# Patient Record
Sex: Male | Born: 1940 | Race: White | Hispanic: No | Marital: Married | Smoking: Former smoker
Health system: Southern US, Community
[De-identification: ages and names within clinical notes are randomized; demographics above are authoritative.]

## PROBLEM LIST (undated history)

## (undated) DIAGNOSIS — K219 Gastro-esophageal reflux disease without esophagitis: Secondary | ICD-10-CM

## (undated) DIAGNOSIS — M1990 Unspecified osteoarthritis, unspecified site: Secondary | ICD-10-CM

## (undated) DIAGNOSIS — E7800 Pure hypercholesterolemia, unspecified: Secondary | ICD-10-CM

## (undated) DIAGNOSIS — J309 Allergic rhinitis, unspecified: Secondary | ICD-10-CM

## (undated) DIAGNOSIS — Z923 Personal history of irradiation: Secondary | ICD-10-CM

## (undated) DIAGNOSIS — D369 Benign neoplasm, unspecified site: Secondary | ICD-10-CM

## (undated) DIAGNOSIS — C801 Malignant (primary) neoplasm, unspecified: Secondary | ICD-10-CM

## (undated) DIAGNOSIS — J449 Chronic obstructive pulmonary disease, unspecified: Secondary | ICD-10-CM

## (undated) DIAGNOSIS — I2510 Atherosclerotic heart disease of native coronary artery without angina pectoris: Secondary | ICD-10-CM

## (undated) DIAGNOSIS — I10 Essential (primary) hypertension: Secondary | ICD-10-CM

## (undated) HISTORY — DX: Gastro-esophageal reflux disease without esophagitis: K21.9

## (undated) HISTORY — DX: Essential (primary) hypertension: I10

## (undated) HISTORY — PX: OTHER SURGICAL HISTORY: SHX169

## (undated) HISTORY — DX: Allergic rhinitis, unspecified: J30.9

## (undated) HISTORY — DX: Pure hypercholesterolemia, unspecified: E78.00

## (undated) HISTORY — PX: CORONARY ANGIOPLASTY: SHX604

## (undated) HISTORY — PX: CORONARY ARTERY BYPASS GRAFT: SHX141

## (undated) HISTORY — DX: Atherosclerotic heart disease of native coronary artery without angina pectoris: I25.10

## (undated) HISTORY — DX: Personal history of irradiation: Z92.3

## (undated) HISTORY — DX: Benign neoplasm, unspecified site: D36.9

---

## 1999-12-05 HISTORY — PX: CORONARY STENT PLACEMENT: SHX1402

## 2000-02-15 ENCOUNTER — Encounter: Payer: Self-pay | Admitting: *Deleted

## 2000-02-15 ENCOUNTER — Ambulatory Visit (HOSPITAL_COMMUNITY): Admission: RE | Admit: 2000-02-15 | Discharge: 2000-02-16 | Payer: Self-pay | Admitting: *Deleted

## 2001-02-26 ENCOUNTER — Inpatient Hospital Stay (HOSPITAL_COMMUNITY): Admission: RE | Admit: 2001-02-26 | Discharge: 2001-03-02 | Payer: Self-pay | Admitting: *Deleted

## 2001-02-26 ENCOUNTER — Encounter: Payer: Self-pay | Admitting: Cardiothoracic Surgery

## 2001-02-27 ENCOUNTER — Encounter: Payer: Self-pay | Admitting: Cardiothoracic Surgery

## 2001-02-28 ENCOUNTER — Encounter: Payer: Self-pay | Admitting: Cardiothoracic Surgery

## 2001-03-01 ENCOUNTER — Encounter: Payer: Self-pay | Admitting: Cardiothoracic Surgery

## 2001-12-04 DIAGNOSIS — D369 Benign neoplasm, unspecified site: Secondary | ICD-10-CM

## 2001-12-04 HISTORY — DX: Benign neoplasm, unspecified site: D36.9

## 2002-10-22 ENCOUNTER — Ambulatory Visit (HOSPITAL_COMMUNITY): Admission: RE | Admit: 2002-10-22 | Discharge: 2002-10-22 | Payer: Self-pay | Admitting: Internal Medicine

## 2003-11-24 ENCOUNTER — Ambulatory Visit (HOSPITAL_COMMUNITY): Admission: RE | Admit: 2003-11-24 | Discharge: 2003-11-24 | Payer: Self-pay | Admitting: Family Medicine

## 2003-11-26 ENCOUNTER — Ambulatory Visit (HOSPITAL_COMMUNITY): Admission: RE | Admit: 2003-11-26 | Discharge: 2003-11-26 | Payer: Self-pay | Admitting: Family Medicine

## 2006-03-22 ENCOUNTER — Ambulatory Visit: Payer: Self-pay | Admitting: Internal Medicine

## 2006-03-22 ENCOUNTER — Ambulatory Visit (HOSPITAL_COMMUNITY): Admission: RE | Admit: 2006-03-22 | Discharge: 2006-03-22 | Payer: Self-pay | Admitting: Internal Medicine

## 2006-03-26 ENCOUNTER — Ambulatory Visit: Payer: Self-pay | Admitting: Internal Medicine

## 2006-04-10 ENCOUNTER — Ambulatory Visit: Payer: Self-pay | Admitting: *Deleted

## 2006-04-11 ENCOUNTER — Ambulatory Visit: Payer: Self-pay | Admitting: Internal Medicine

## 2006-05-16 ENCOUNTER — Ambulatory Visit: Payer: Self-pay | Admitting: Internal Medicine

## 2006-07-30 ENCOUNTER — Ambulatory Visit: Payer: Self-pay | Admitting: Internal Medicine

## 2006-08-17 ENCOUNTER — Ambulatory Visit: Payer: Self-pay | Admitting: Internal Medicine

## 2006-08-20 ENCOUNTER — Ambulatory Visit (HOSPITAL_COMMUNITY): Admission: RE | Admit: 2006-08-20 | Discharge: 2006-08-20 | Payer: Self-pay | Admitting: Internal Medicine

## 2006-08-20 ENCOUNTER — Ambulatory Visit: Payer: Self-pay | Admitting: Internal Medicine

## 2007-09-18 ENCOUNTER — Encounter: Payer: Self-pay | Admitting: Cardiovascular Disease

## 2007-10-04 ENCOUNTER — Encounter (HOSPITAL_COMMUNITY): Admission: RE | Admit: 2007-10-04 | Discharge: 2007-11-03 | Payer: Self-pay | Admitting: Orthopedic Surgery

## 2007-12-13 ENCOUNTER — Encounter: Payer: Self-pay | Admitting: Cardiovascular Disease

## 2008-07-29 ENCOUNTER — Encounter: Payer: Self-pay | Admitting: Cardiovascular Disease

## 2009-03-02 ENCOUNTER — Ambulatory Visit (HOSPITAL_COMMUNITY): Admission: RE | Admit: 2009-03-02 | Discharge: 2009-03-02 | Payer: Self-pay | Admitting: Family Medicine

## 2009-05-13 ENCOUNTER — Encounter: Payer: Self-pay | Admitting: Cardiovascular Disease

## 2009-06-28 ENCOUNTER — Encounter: Payer: Self-pay | Admitting: Cardiovascular Disease

## 2009-07-02 ENCOUNTER — Encounter: Payer: Self-pay | Admitting: Cardiovascular Disease

## 2009-07-12 ENCOUNTER — Encounter: Payer: Self-pay | Admitting: Cardiovascular Disease

## 2009-07-26 ENCOUNTER — Encounter: Admission: RE | Admit: 2009-07-26 | Discharge: 2009-07-26 | Payer: Self-pay | Admitting: Rheumatology

## 2009-10-08 ENCOUNTER — Encounter: Payer: Self-pay | Admitting: Cardiovascular Disease

## 2010-05-10 ENCOUNTER — Encounter: Payer: Self-pay | Admitting: Cardiovascular Disease

## 2010-07-02 ENCOUNTER — Encounter: Payer: Self-pay | Admitting: Cardiovascular Disease

## 2010-12-30 ENCOUNTER — Ambulatory Visit
Admission: RE | Admit: 2010-12-30 | Discharge: 2010-12-30 | Payer: Self-pay | Source: Home / Self Care | Attending: Cardiovascular Disease | Admitting: Cardiovascular Disease

## 2010-12-30 ENCOUNTER — Encounter: Payer: Self-pay | Admitting: Cardiovascular Disease

## 2010-12-30 DIAGNOSIS — I10 Essential (primary) hypertension: Secondary | ICD-10-CM | POA: Insufficient documentation

## 2010-12-30 DIAGNOSIS — I25810 Atherosclerosis of coronary artery bypass graft(s) without angina pectoris: Secondary | ICD-10-CM | POA: Insufficient documentation

## 2010-12-30 DIAGNOSIS — E785 Hyperlipidemia, unspecified: Secondary | ICD-10-CM | POA: Insufficient documentation

## 2011-01-05 ENCOUNTER — Encounter: Payer: Self-pay | Admitting: Physician Assistant

## 2011-01-05 NOTE — Assessment & Plan Note (Signed)
Summary: np6/dx:CAD;HTN   Visit Type:  Initial Consult Primary Provider:  Yetta Numbers  CC:  CAD-HTN.  History of Present Illness: 70 year-old male presents for initial evaluation of CAD. He has been followed by the [facility] and wishes to establish here.   The patient developed chest and elbow pain in 2000 in a crescendo pattern. He underwent PCI of the LAD with a bare-metal stent. 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  He denies chest pain or tightness. He does complain of exertional dyspnea and attributes this to COPD. He has mild leg swelling. No orthopnea or PND. No other complaints.  Current Medications (verified): 1)  Exforge 5-160 Mg Tabs (Amlodipine Besylate-Valsartan) .... Take 1 Tablet By Mouth Once A Day 2)  Januvia 100 Mg Tabs (Sitagliptin Phosphate) .... Take 1 Tablet By Mouth Once A Day 3)  Omeprazole 20 Mg Tbec (Omeprazole) .... Take 1 Tablet By Mouth Once A Day 4)  Metformin Hcl 500 Mg Tabs (Metformin Hcl) .... Take 1 Tablet By Mouth Two Times A Day 5)  Aspirin 81 Mg Tbec (Aspirin) .... Take One Tablet By Mouth Daily 6)  Hydroxychloroquine Sulfate 200 Mg Tabs (Hydroxychloroquine Sulfate) .... Take 1 Tablet By Mouth Two Times A Day 7)  Metoprolol Succinate 25 Mg Xr24h-Tab (Metoprolol Succinate) .... Take One Tablet By Mouth Daily 8)  Pravastatin Sodium 20 Mg Tabs (Pravastatin Sodium) .... Take One Tablet By Mouth Daily At Bedtime  Allergies (verified): No Known Drug Allergies  Past History:  Past medical, surgical, family and social histories (including risk factors) reviewed, and no changes noted (except as noted below).  Past Medical History: Coronary Artery Disease s/p PCI 2000 and single vessel CABG 2001 Hypertension Hypercholesterolemia GERD  Small sliding hiatal hernia History of tubular adenomas in 2003  Past Surgical  History: Reviewed history from 12/29/2010 and no changes required.  stent placement in   March 2001.  He has occlusion of the stent and underwent one-vessel coronary artery bypass in March 2002.  left knee arthroscopy for meniscal tear.  left rotator cuff repair.  Family History: Reviewed history from 12/29/2010 and no changes required. Mother died from Heart failure Brother and father died from lung cancer  Social History: Reviewed history from 12/29/2010 and no changes required.   He is married with 2 grandchildren.  Quit smoking years  ago.  Ocassionally consumes alcohol.  Review of Systems       Negative except as per HPI   Vital Signs:  Patient profile:   70 year old male Height:      69 inches Weight:      217.75 pounds BMI:     32.27 Pulse rate:   71 / minute Pulse rhythm:   regular Resp:     18 per minute BP sitting:   135 / 78  (left arm) Cuff size:   large  Vitals Entered By: Vikki Ports (December 30, 2010 8:50 AM)  Physical Exam  General:  Pt is well-developed, overweight male, alert and oriented, no acute distress HEENT: normal Neck: no thyromegaly           JVP normal, carotid upstrokes normal without bruits Lungs: CTA Chest: equal expansion  CV: Apical impulse nondisplaced, RRR without murmur or gallop Abd: soft, NT, positive BS, no HSM, no bruit Back: no CVA tenderness Ext: no clubbing, cyanosis, or edema  femoral pulses 2+ without bruits        pedal pulses 2+ and equal Skin: warm, dry, no rash Neuro: CNII-XII intact,strength 5/5 = b/l    EKG  Procedure date:  12/30/2010  Findings:      NSR, 71 bpm, within normal limits.  Impression & Recommendations:  Problem # 1:  CORONARY ATHEROSLERO AUTOL VEIN BYPASS GRAFT (ICD-414.02) Pt is s/p CABG, 10 years removed, with LIMA to LAD. He is symptom-free. I have recommended an exercise treadmill stress test and continued medical management. He is on appropriate medical therapy with ASA,  beta-blocker, and statin. He has had a great deal of difficulty with statin intolerance but is tolerating low-dose pravastatin and lipids are followed by his PCP. We discussed an exercise and weight loss program.  His updated medication list for this problem includes:    Exforge 5-160 Mg Tabs (Amlodipine besylate-valsartan) .Marland Kitchen... Take 1 tablet by mouth once a day    Aspirin 81 Mg Tbec (Aspirin) .Marland Kitchen... Take one tablet by mouth daily    Metoprolol Succinate 25 Mg Xr24h-tab (Metoprolol succinate) .Marland Kitchen... Take one tablet by mouth daily  Orders: EKG w/ Interpretation (93000) Treadmill (Treadmill)  Problem # 2:  HYPERTENSION, BENIGN (ICD-401.1)  BP controlled.  His updated medication list for this problem includes:    Exforge 5-160 Mg Tabs (Amlodipine besylate-valsartan) .Marland Kitchen... Take 1 tablet by mouth once a day    Aspirin 81 Mg Tbec (Aspirin) .Marland Kitchen... Take one tablet by mouth daily    Metoprolol Succinate 25 Mg Xr24h-tab (Metoprolol succinate) .Marland Kitchen... Take one tablet by mouth daily  BP today: 135/78  His updated medication list for this problem includes:    Exforge 5-160 Mg Tabs (Amlodipine besylate-valsartan) .Marland Kitchen... Take 1 tablet by mouth once a day    Aspirin 81 Mg Tbec (Aspirin) .Marland Kitchen... Take one tablet by mouth daily    Metoprolol Succinate 25 Mg Xr24h-tab (Metoprolol succinate) .Marland Kitchen... Take one tablet by mouth daily  Patient Instructions: 1)  Your physician recommends that you continue on your current medications as directed. Please refer to the Current Medication list given to you today. 2)  Your physician wants you to follow-up in:  1 YEAR.  You will receive a reminder letter in the mail two months in advance. If you don't receive a letter, please call our office to schedule the follow-up appointment. 3)  Your physician has requested that you have an exercise tolerance test.  For further information please visit [email or web address]  Please also follow instruction sheet, as given.  Appended  Document: np6/dx:CAD;HTN Record review: Echo 06/14/2009 - LVEF 50-55% with mildly dilated LV. Myoview 07/12/2009 - Normal perfusion, no inducible ischemia, post-stress LVEF 59%.

## 2011-01-10 ENCOUNTER — Encounter (INDEPENDENT_AMBULATORY_CARE_PROVIDER_SITE_OTHER): Payer: MEDICARE | Admitting: Physician Assistant

## 2011-01-10 ENCOUNTER — Encounter: Payer: Self-pay | Admitting: Physician Assistant

## 2011-01-10 ENCOUNTER — Encounter (INDEPENDENT_AMBULATORY_CARE_PROVIDER_SITE_OTHER): Payer: MEDICARE

## 2011-01-10 ENCOUNTER — Encounter: Payer: Self-pay | Admitting: Cardiovascular Disease

## 2011-01-10 DIAGNOSIS — R0989 Other specified symptoms and signs involving the circulatory and respiratory systems: Secondary | ICD-10-CM

## 2011-01-10 DIAGNOSIS — I2510 Atherosclerotic heart disease of native coronary artery without angina pectoris: Secondary | ICD-10-CM

## 2011-01-19 ENCOUNTER — Ambulatory Visit (HOSPITAL_COMMUNITY)
Admission: RE | Admit: 2011-01-19 | Discharge: 2011-01-19 | Disposition: A | Discharge status: Home / Self Care | Payer: Medicare Other | Source: Ambulatory Visit | Attending: Cardiovascular Disease | Admitting: Cardiovascular Disease

## 2011-01-19 ENCOUNTER — Encounter: Payer: Self-pay | Admitting: Cardiovascular Disease

## 2011-01-19 ENCOUNTER — Other Ambulatory Visit: Payer: Self-pay | Admitting: Cardiovascular Disease

## 2011-01-19 DIAGNOSIS — J4489 Other specified chronic obstructive pulmonary disease: Secondary | ICD-10-CM | POA: Insufficient documentation

## 2011-01-19 DIAGNOSIS — Z951 Presence of aortocoronary bypass graft: Secondary | ICD-10-CM | POA: Insufficient documentation

## 2011-01-19 DIAGNOSIS — J449 Chronic obstructive pulmonary disease, unspecified: Secondary | ICD-10-CM | POA: Insufficient documentation

## 2011-01-19 DIAGNOSIS — Z01818 Encounter for other preprocedural examination: Secondary | ICD-10-CM | POA: Insufficient documentation

## 2011-01-19 LAB — CONVERTED CEMR LAB
INR: 1.07 (ref <1.50)
Prothrombin Time: 14.1 s (ref 11.6–15.2)

## 2011-01-19 NOTE — Letter (Signed)
Summary: Cardiac Catheterization Instructions- [facility]  [facility], Main Office  [address]   [place], [place] [address]   Phone: [phone]  Fax: [phone]     01/10/2011 MRN: [id number]  David [name] [address] [place], [place]  [address]  [place]  Dear David [name],   You are scheduled for a Cardiac Catheterization on Wednesday January 25, 2011 with Dr. Excell Seltzer  Please arrive to the 1st floor of the Heart and Vascular Center at [facility] at 7:30 am on the day of your procedure. Please do not arrive before 6:30 a.m. Call the Heart and Vascular Center at [phone] if you are unable to make your appointmnet. The Code to get into the parking garage under the building is 0300. Take the elevators to the 1st floor. You must have someone to drive you home. Someone must be with you for the first 24 hours after you arrive home. Please wear clothes that are easy to get on and off and wear slip-on shoes. Do not eat or drink after midnight except water with your medications that morning. Bring all your medications and current insurance cards with you.  _X__ DO NOT take these medications before your procedure: Do not take Januvia the morning of procedure. Do not take Metformin the night before, morning of, or 48 hours after procedure.   _X__ Make sure you take your aspirin.  _X__ You may take ALL of your medications with water that morning.  The usual length of stay after your procedure is 2 to 3 hours. This can vary.  If you have any questions, please call the office at the number listed above.   David Gutting, RN, BSN  Appended Document: Cardiac Catheterization Instructions- [facility] Order given to pt for pre-procedure labs and chest x-ray to be done in [place] on 01/19/11 or 01/20/11.

## 2011-01-20 LAB — CONVERTED CEMR LAB
BUN: 13 mg/dL (ref 6–23)
Basophils Absolute: 0 10*3/uL (ref 0.0–0.1)
Basophils Relative: 1 % (ref 0–1)
CO2: 28 meq/L (ref 19–32)
Calcium: 8.9 mg/dL (ref 8.4–10.5)
Chloride: 104 meq/L (ref 96–112)
Creatinine, Ser: 0.87 mg/dL (ref 0.40–1.50)
Eosinophils Absolute: 0.3 10*3/uL (ref 0.0–0.7)
Eosinophils Relative: 3 % (ref 0–5)
Glucose, Bld: 175 mg/dL — ABNORMAL HIGH (ref 70–99)
HCT: 45.6 % (ref 39.0–52.0)
Hemoglobin: 14.6 g/dL (ref 13.0–17.0)
Lymphocytes Relative: 16 % (ref 12–46)
Lymphs Abs: 1.3 10*3/uL (ref 0.7–4.0)
MCHC: 32 g/dL (ref 30.0–36.0)
MCV: 92.9 fL (ref 78.0–100.0)
Monocytes Absolute: 0.9 10*3/uL (ref 0.1–1.0)
Monocytes Relative: 11 % (ref 3–12)
Neutro Abs: 5.6 10*3/uL (ref 1.7–7.7)
Neutrophils Relative %: 70 % (ref 43–77)
Platelets: 290 10*3/uL (ref 150–400)
Potassium: 4.8 meq/L (ref 3.5–5.3)
RBC: 4.91 M/uL (ref 4.22–5.81)
RDW: 14.9 % (ref 11.5–15.5)
Sodium: 140 meq/L (ref 135–145)
WBC: 8.1 10*3/uL (ref 4.0–10.5)

## 2011-01-24 ENCOUNTER — Encounter: Payer: Self-pay | Admitting: Cardiovascular Disease

## 2011-01-25 ENCOUNTER — Inpatient Hospital Stay (HOSPITAL_BASED_OUTPATIENT_CLINIC_OR_DEPARTMENT_OTHER)
Admission: RE | Admit: 2011-01-25 | Discharge: 2011-01-25 | Disposition: A | Discharge status: Home / Self Care | Payer: Medicare Other | Source: Ambulatory Visit | Attending: Cardiovascular Disease | Admitting: Cardiovascular Disease

## 2011-01-25 DIAGNOSIS — R079 Chest pain, unspecified: Secondary | ICD-10-CM | POA: Insufficient documentation

## 2011-01-25 DIAGNOSIS — I2510 Atherosclerotic heart disease of native coronary artery without angina pectoris: Secondary | ICD-10-CM | POA: Insufficient documentation

## 2011-01-25 DIAGNOSIS — Z951 Presence of aortocoronary bypass graft: Secondary | ICD-10-CM | POA: Insufficient documentation

## 2011-01-25 DIAGNOSIS — T82897A Other specified complication of cardiac prosthetic devices, implants and grafts, initial encounter: Secondary | ICD-10-CM | POA: Insufficient documentation

## 2011-01-25 DIAGNOSIS — Y831 Surgical operation with implant of artificial internal device as the cause of abnormal reaction of the patient, or of later complication, without mention of misadventure at the time of the procedure: Secondary | ICD-10-CM | POA: Insufficient documentation

## 2011-01-25 DIAGNOSIS — R9439 Abnormal result of other cardiovascular function study: Secondary | ICD-10-CM | POA: Insufficient documentation

## 2011-01-26 LAB — POCT I-STAT GLUCOSE
Glucose, Bld: 160 mg/dL — ABNORMAL HIGH (ref 70–99)
Operator id: 141321

## 2011-01-31 ENCOUNTER — Encounter: Payer: Self-pay | Admitting: Cardiovascular Disease

## 2011-02-02 ENCOUNTER — Encounter: Payer: Medicare Other | Admitting: Physician Assistant

## 2011-02-08 ENCOUNTER — Ambulatory Visit (INDEPENDENT_AMBULATORY_CARE_PROVIDER_SITE_OTHER): Payer: Medicare Other | Admitting: Adult Health

## 2011-02-08 ENCOUNTER — Encounter: Payer: Self-pay | Admitting: Adult Health

## 2011-02-08 DIAGNOSIS — I2510 Atherosclerotic heart disease of native coronary artery without angina pectoris: Secondary | ICD-10-CM

## 2011-02-08 DIAGNOSIS — E7800 Pure hypercholesterolemia, unspecified: Secondary | ICD-10-CM

## 2011-02-08 DIAGNOSIS — I10 Essential (primary) hypertension: Secondary | ICD-10-CM

## 2011-02-09 ENCOUNTER — Telehealth: Payer: Self-pay | Admitting: Cardiovascular Disease

## 2011-02-09 ENCOUNTER — Encounter: Payer: Self-pay | Admitting: Adult Health

## 2011-02-09 NOTE — Progress Notes (Signed)
Summary: [facility]: Office Visit  [facility]: Office Visit   Imported By: Earl Many 01/31/2011 17:20:43  _____________________________________________________________________  External Attachment:    Type:   Image     Comment:   External Document

## 2011-02-09 NOTE — Progress Notes (Signed)
Summary: [facility]: Office Visit  [facility]: Office Visit   Imported By: Earl Many 01/31/2011 17:32:42  _____________________________________________________________________  External Attachment:    Type:   Image     Comment:   External Document

## 2011-02-09 NOTE — Cardiovascular Report (Signed)
Summary: Pre-Cath Orders  Pre-Cath Orders   Imported By: Marylou Mccoy 01/31/2011 10:42:33  _____________________________________________________________________  External Attachment:    Type:   Image     Comment:   External Document

## 2011-02-09 NOTE — Progress Notes (Signed)
Summary: [facility]: Office Visit  [facility]: Office Visit   Imported By: Earl Many 01/31/2011 18:38:49  _____________________________________________________________________  External Attachment:    Type:   Image     Comment:   External Document

## 2011-02-09 NOTE — Procedures (Signed)
NAME:  David [name], David [name]             ACCOUNT NO.:  [id number]  MEDICAL RECORD NO.:  [id number]           PATIENT TYPE:  O  LOCATION:  RAD                           FACILITY:  [facility]  PHYSICIAN:  Veverly Fells. Excell Seltzer, MD  DATE OF BIRTH:  1941/04/21  DATE OF PROCEDURE:  01/25/2011 DATE OF DISCHARGE:  01/19/2011                           CARDIAC CATHETERIZATION   PROCEDURE: 1. Left heart catheterization. 2. Selective coronary angiography. 3. Left ventricular angiography. 4. LIMA angiography.  PROCEDURAL INDICATIONS:  Mr. Sarver is a 70 year old gentleman who is 10 years out from single-vessel coronary bypass with a LIMA to LAD. This was done to treat severe proximal LAD in-stent restenosis.  He presented for follow-up evaluation recently and underwent an exercise treadmill study.  During the treadmill study, he had significant ST- segment depression associated with chest discomfort.  He was referred for cardiac catheterization in the setting of his abnormal stress test and known CAD.  Risks and indications of the procedure were reviewed with the patient. Informed consent was obtained.  The right groin was prepped, draped and anesthetized with 1% lidocaine using modified Seldinger technique.  A 4- French sheath was placed in the right femoral artery.  Standard Judkins catheters were used for coronary angiography and left ventriculography. A 3DRC catheter was used for LIMA angiography.  The patient tolerated the procedure well.  There were no immediate complications.  All catheter exchanges were performed over a wire.  PROCEDURAL FINDINGS:  Aortic pressure 133/66 with a mean of 95, left ventricular pressure 134/20.  Left ventriculography shows low normal LV function with an estimated ejection fraction of 50-55%.  There is no significant mitral regurgitation and there are no regional wall motion abnormalities noted.  Coronary angiography:  The left mainstem is patent.  There is  no significant obstructive disease.  The left main divides into the LAD and left circumflex.  LAD:  The LAD has severe stenosis proximally.  There is a stent with diffuse 90% stenosis.  There is a small first diagonal branch that divides into twin vessels and it is proximal to the LIMA insertion site. The remaining portions of the LAD fill exclusively from the LIMA graft.  Left circumflex:  The left circumflex is widely patent.  There are minor luminal irregularities with 25% mid stenosis present.  There is a large single obtuse marginal branch, they then divides into multiple sub- branches distally.  There is no obstructive disease of the marginal branch.  Right coronary artery:  The RCA is a moderate sized, dominant vessel. There is 30-40% mid stenosis, proximal vessel has nonobstructive plaque. The remaining portions of the RCA are patent with mild luminal irregularities.  The vessel gives off a PDA and two posterolateral branches distally.  LIMA to LAD is widely patent.  The distal anastomotic site is widely patent.  The proximal LAD fills retrograde from the graft as well.  The LAD reaches the left ventricular apex.  FINAL ASSESSMENT: 1. 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 2. Nonobstructive left circumflex  and right coronary artery stenosis. 3. Normal left ventricular function.  RECOMMENDATIONS:  The patient should continue on medical therapy.  He has a widely patent LIMA to LAD and nonobstructive disease elsewhere. He has a small diagonal branch that arises proximal to the LIMA graft and it is possible that his abnormal stress test arose from ischemia in that area.  Those vessels are very small and do not require PCI.  He should do well with ongoing medical treatment.     Veverly Fells. Excell Seltzer, MD     MDC/MEDQ  D:  01/25/2011  T:  01/25/2011  Job:   [id number]  cc:   [name], M.D.  Electronically Signed by Tonny Bollman MD on 02/07/2011 08:58:35 PM

## 2011-02-14 NOTE — Progress Notes (Signed)
Summary: Pt will be followed in [facility] by Dr Excell Seltzer  Phone Note Call from Patient Call back at Work Phone [phone] Call back at [phone]   Caller: Patient Reason for Call: Talk to Nurse Initial call taken by: Judie Grieve,  February 09, 2011 9:02 AM  Follow-up for Phone Call        I spoke with the pt and he had his post hospital follow-up appt in [place] yesterday.  The pt is not planning on being seen in the [place] office long term.  The pt will be coming back to the [place] office to see Dr Excell Seltzer.  I made the pt aware that since he did have a recent intervention then he should plan on seeing Dr Excell Seltzer in 6 months as recommended from 02/08/11 OV. Follow-up by: Julieta Gutting, RN, BSN,  February 09, 2011 9:45 AM

## 2011-02-14 NOTE — Assessment & Plan Note (Signed)
Summary: POST CATH GROIN CHECK PER MARK FROM [facility]   Visit Type:  Follow-up Primary Provider:  Yetta Numbers  CC:  post hospital .  History of Present Illness: David [name] is a pleasant70 y/o CM we are seeing on follow-up after initial visit with Dr. Tonny Bollman to be established in the practice.  He is a former [facility] patient.  He has known hsitory of CAD with LIMA to LAD (2001), and previous stent to the LAD (2000). Because of recurrent symptoms of DOE the patient was scheduled for cardiac stress test.  This was found to be abnormal and cardiac catherization was recommended.  Catherization was completed  01/19/2011 by Dr. Excell Seltzer.  He was found to have severe proximal LAD instent restenosis with a patent LIMA to LAD.  Nonobstructive left Cx and RCA. Normal Lv fx.  He is to be continued on medical regimin.  He is without complaints of chest discomfort or DOE today.  He states that he feels great and was very pleased with Dr. Excell Seltzer and the [facility] staff in [place].  He will now be followed in [place] as this is closer to his home.  Current Medications (verified): 1)  Exforge 5-160 Mg Tabs (Amlodipine Besylate-Valsartan) .... Take 1 Tablet By Mouth Once A Day 2)  Januvia 100 Mg Tabs (Sitagliptin Phosphate) .... Take 1 Tablet By Mouth Once A Day 3)  Omeprazole 20 Mg Tbec (Omeprazole) .... Take 1 Tablet By Mouth Once A Day 4)  Metformin Hcl 500 Mg Tabs (Metformin Hcl) .... Take 1 Tablet By Mouth Two Times A Day 5)  Aspirin 81 Mg Tbec (Aspirin) .... Take One Tablet By Mouth Daily 6)  Hydroxychloroquine Sulfate 200 Mg Tabs (Hydroxychloroquine Sulfate) .... Take 1 Tablet By Mouth Two Times A Day 7)  Metoprolol Succinate 25 Mg Xr24h-Tab (Metoprolol Succinate) .... Take One Tablet By Mouth Daily 8)  Pravastatin Sodium 20 Mg Tabs (Pravastatin Sodium) .... Take One Tablet By Mouth Daily At Bedtime  Allergies (verified): No Known Drug Allergies  Comments:  Nurse/Medical Assistant: patient  and i reviewed meds no list  no meds [facility]  Past History:  Past medical, surgical, family and social histories (including risk factors) reviewed, and no changes noted (except as noted below).  Past Medical History: Reviewed history from 12/30/2010 and no changes required. Coronary Artery Disease s/p PCI 2000 and single vessel CABG 2001 Hypertension Hypercholesterolemia GERD  Small sliding hiatal hernia History of tubular adenomas in 2003  Past Surgical History: Reviewed history from 12/29/2010 and no changes required.  stent placement in   March 2001.  He has occlusion of the stent and underwent one-vessel coronary artery bypass in March 2002.  left knee arthroscopy for meniscal tear.  left rotator cuff repair.  Family History: Reviewed history from 12/29/2010 and no changes required. Mother died from Heart failure Brother and father died from lung cancer  Social History: Reviewed history from 12/29/2010 and no changes required.   He is married with 2 grandchildren.  Quit smoking years  ago.  Ocassionally consumes alcohol.  Review of Systems       All other systems have been reviewed and are negative unless stated above.   Vital Signs:  Patient profile:   70 year old male Weight:      220 pounds BMI:     32.61 Pulse rate:   92 / minute BP sitting:   127 / 79  (left arm)  Vitals Entered By: Dreama Saa, CNA (February 08, 2011 2:33 PM)  Physical Exam  General:  Well developed, well nourished, in no acute distress.obese.   Lungs:  Clear bilaterally to auscultation and percussion. Heart:  Non-displaced PMI, chest non-tender; regular rate and rhythm, S1, S2 without murmurs, rubs or gallops. Carotid upstroke normal, no bruit. Normal abdominal aortic size, no bruits. Femorals normal pulses, no bruits. Pedals normal pulses. No edema, no varicosities. Abdomen:  Bowel sounds positive; abdomen soft and non-tender without masses, organomegaly, or hernias noted. No  hepatosplenomegaly. Msk:  Back normal, normal gait. Muscle strength and tone normal. Pulses:  pulses normal in all 4 extremities Extremities:  No clubbing or cyanosis. Neurologic:  Alert and oriented x 3. Psych:  Normal affect.   EKG  Procedure date:  02/08/2011  Findings:      Normal sinus rhythm with rate of:  81 bpm  Impression & Recommendations:  Problem # 1:  CORONARY ATHEROSLERO AUTOL VEIN BYPASS GRAFT (ICD-414.02) Recent cardiac catherization was reassuring.  He is asymptomatic. We will continue risk factor modification and current medications at this time. He will follow with [place] in 6 months unless needed to be seen sooner.  He verbalizes understanding. His updated medication list for this problem includes:    Exforge 5-160 Mg Tabs (Amlodipine besylate-valsartan) .Marland Kitchen... Take 1 tablet by mouth once a day    Aspirin 81 Mg Tbec (Aspirin) .Marland Kitchen... Take one tablet by mouth daily    Metoprolol Succinate 25 Mg Xr24h-tab (Metoprolol succinate) .Marland Kitchen... Take one tablet by mouth daily  Orders: EKG w/ Interpretation (93000)  Problem # 2:  HYPERTENSION, BENIGN (ICD-401.1) Well controlled. His updated medication list for this problem includes:    Exforge 5-160 Mg Tabs (Amlodipine besylate-valsartan) .Marland Kitchen... Take 1 tablet by mouth once a day    Aspirin 81 Mg Tbec (Aspirin) .Marland Kitchen... Take one tablet by mouth daily    Metoprolol Succinate 25 Mg Xr24h-tab (Metoprolol succinate) .Marland Kitchen... Take one tablet by mouth daily  Orders: EKG w/ Interpretation (93000)  Problem # 3:  HYPERLIPIDEMIA-MIXED (ICD-272.4) Follwed by Dr. Regino Schultze His updated medication list for this problem includes:    Pravastatin Sodium 20 Mg Tabs (Pravastatin sodium) .Marland Kitchen... Take one tablet by mouth daily at bedtime  Patient Instructions: 1)  Your physician wants you to follow-up in: 6 months. You will receive a reminder letter in the mail about two months in advance. If you don't receive a letter, please call our office to schedule the  follow-up appointment. 2)  Your physician recommends that you continue on your current medications as directed. Please refer to the Current Medication list given to you today. 3)  YOU MAY HAVE REFILLS ON ANY OF YOUR MEDICATIONS PER REQUEST/PER [facility].

## 2011-04-21 NOTE — Consult Note (Signed)
. [facility]  Patient:    David [name], David [name]                    MRN: [id number] Adm. Date:  [id number] Attending:  Lenise Herald H CC:         Lenise Herald, M.D.                          Consultation Report  REFERRING PHYSICIAN:  Lenise Herald, M.D.  FOLLOW-UP CARDIOLOGIST:  Lenise Herald, M.D.  PRIMARY CARE PHYSICIAN:  Dr. Jodelle Green.  REASON FOR CONSULTATION:  High-grade proximal LAD stenosis, status post angioplasty and positive stress test.  HISTORY OF PRESENT ILLNESS:  The patient is a 70 year old male with a history of smoking history in the past and mild diabetes, who presents following a routine follow-up Cardiolite stress test.  The patient had previously had chest burning in March 2001, in that time had a positive stress test and ultimately underwent angioplasty of a proximal LAD lesion with intracoronary stent.  Since that time, the patient has done well, continuing to avoid cigarettes and engaged in active weight loss and exercise program, having lost 30-35 pounds.  He notes that overall his physical endurance is markedly improved.  On the follow-up Cardiolite stress test, he was positive with anterior ischemia, which led to a cardiac catheterization by Dr. Jenne Campus today.  This showed 30% luminal irregularities of the right coronary artery and high-grade, greater than 95% in-stent stenosis of the proximal LAD. Overall, ventricular function is relatively well-preserved.  The patient has had no definite anginal symptoms, though for several months since the fall of 2001, he had been bothered by cold-like symptoms associated with a dry, nonproductive cough.  In February, he saw Dr. Sandrea Hughs for this, and he was taken off Monopril and started on Diovan and noted within three days marked improvement in his respiratory symptoms and cough.  He denies any previous history of myocardial infarction.  He has had no previous cardiac  surgery.  As noted, his Cardiolite stress showed significant anterior and anterior septal wall ischemia with normal ejection fraction done February 15, 2001.  Cardiac risk factors include hypertension, hyperlipidemia, positive family with congestive heart failure, myocardial infarction in his mother in her 87s.  Father died of pulmonary embolus in a setting of cancer.  The patient denies any previous stroke, denies peripheral vascular disease, denies renal insufficiency.  He does have type 2 diabetes but currently on no medication.  We do not have a recent hemoglobin A1C.  PAST MEDICAL HISTORY:  Significant for erectile dysfunction, currently on Viagra.  PAST SURGICAL HISTORY:  Arthroscopy, 1977.  SOCIAL HISTORY:  The patient is married, has two children.  The patient is currently employed as Systems developer.  He does occasionally drink alcohol but has avoided cigarettes for the past 3-1/2 years.  MEDICATIONS:  Medications at the time of admission include Lipitor 10 mg a day, Norvasc 5 mg a day, Diovan 80 mg a day, aspirin 325 mg a day, vitamin E 400 units, vitamin C 500 mg b.i.d., Nexium 40 mg q.d., Viagra, and Toprol.  ALLERGIES:  Drug allergies include MONOPRIL, which causes cough.  CARDIAC REVIEW OF SYSTEMS:  Patient denies chest pain, lower extremity edema, resting shortness of breath, palpitations, syncope, exertional shortness of breath, or presyncope.  He does have two-pillow orthopnea at times.  GENERAL REVIEW OF SYSTEMS:  Overall, patient has lost 30-35 pounds with regular exercise.  His respiratory status has been stable since his cough resolved after stopping the Monopril.  GASTROINTESTINAL:  The patient denies any blood or dark stools.  Denies any TIAs or amaurosis.  Does get some muscle cramps in his right leg.  Denies any blood in his urine.  Denies any recent infections.  Denies any obvious blood loss or any previous history of low hematocrit.  ENDOCRINE:  Denies any  thyroid problems.  Does have type 2 diet-controlled diabetes.  Denies any psychiatric history.  Denies claudication or other signs of peripheral vascular disease.  PHYSICAL EXAMINATION:  VITAL SIGNS:  The patients blood pressure is 122/80, pulse of 72, respiratory rate 18, weight is 207, 5 feet 8-1/2 inches tall.  GENERAL:  The patient has slightly ruddy complexion, in no distress at the time of exam.  Neurologically he was intact and able to relate his history.  HEENT:  Pupils are equal, round and reactive to light.  NECK:  Without carotid bruits or jugular venous distention.  CHEST:  Clear to auscultation and percussion bilaterally.  CARDIAC:  Regular rate and rhythm without murmur or gallop.  ABDOMEN:  Moderately obese but without palpable enlargement of the aorta or organomegaly.  EXTREMITIES:  Patient has 2+ DP and PT pulses bilaterally.  Appears to have adequate vein in lower extremities.  He has no ischemic skin changes in the lower extremities.  NEUROLOGIC:  Neurologic exam is grossly intact.  LYMPHATIC:  He has no palpable cervical or supraclavicular lymph nodes.  LABORATORY DATA:  Laboratory findings reveal a hemoglobin of 10.4 and hematocrit of 31.5.  This lab was drawn as an outpatient on February 20, 2001, through Dr. Loraine Grip office.  Platelet count was 292.  PT 12.4, INR 0.9, PTT 29.  In December 2001, his total cholesterol was 146, triglycerides 94, HDL 42, LDL 85.  Creatinine 0.9 on February 20, 2001.  Cardiac catheterization reveals preserved LV function.  In-stent stenosis of greater than 95% in the proximal LAD.  Circumflex is normal.  Right coronary artery has some distal luminal irregularities and 30-40% stenosis in the right coronary artery.  IMPRESSION: 1. Positive Cardiolite stress test with anterior ischemia in the face of    in-stent greater than 95% stenosis of the proximal left anterior    descending coronary artery. 2. Unexplained drop in  hematocrit from 46% in December to 31.5% on February 20, 2001, without obvious blood loss. 3. Type 2 diabetes mellitus with diet control.   SUGGESTION:  Will obtain follow-up hematocrit, reticulocyte count, guaiac all stools, and check hemoglobin A1C to recheck the patients laboratory abnormalities.  Otherwise, will plan on proceeding with coronary artery bypass grafting with a left internal mammary artery to the left anterior descending coronary artery, possibly off-pump.  This was discussed with the patient and his family in detail, including the risk of death, infection, stroke, myocardial infarction, bleeding, blood transfusion.  The patient is willing to proceed and will tentatively arrange for surgery on February 28, 2000.  The patient is agreeable with this approach. DD:  02/25/01 TD:  02/26/01 Job: [id number]

## 2011-04-21 NOTE — Discharge Summary (Signed)
[place]  Patient:    David [name], David [name]                    MRN: [id number] Adm. Date:  [id number] Disc. Date: [id number] Attending:  Waldo Laine Dictator:   Lissa Hoard, P.A.-C. CC:         Gwenith Daily. Tyrone Sage, M.D.  Lenise Herald, M.D.   Discharge Summary  DATE OF BIRTH:  Apr 24, 1941  ADMITTING DIAGNOSIS:  Coronary artery disease  SECONDARY DIAGNOSIS:   Non-insulin-dependent diabetes mellitus which is diet controlled.  DISCHARGE DIAGNOSIS:   Coronary artery disease.  HOSPITAL PROCEDURES: 1. Cardiac catheterization. 2. Coronary artery bypass graft x 1. 3. Bilateral carotid Duplex. 4. Upper extremity Doppler exam.  HOSPITAL COURSE:  Mr. David [name] was admitted to [facility] on February 25, 2001, secondary to positive stress test.  Because of his positive stress test, he underwent elective cardiac catheterization.  This cardiac catheterization revealed significant coronary artery disease.  Because of this, Dr. Tyrone Sage was consulted.  The patient underwent a coronary artery bypass graft x 1 off pump with the left internal mammary artery anastomosed to the left anterior descending artery.  The procedure was performed by Dr. Tyrone Sage.  No complications were noted during the procedure.  The patient had an uneventful hospital course and was discharged home in stable and stable condition on March 02, 2001.  DISCHARGE MEDICATIONS: 1. Tylox one or two tablets every four to six hours as needed for pain. 2. Aspirin 325 mg one tablet daily. 3. Lipitor 10 mg one tablet daily. 4. Norvasc 5 mg one tablet daily. 5. Diovan 80 mg one tablet daily. 6. Toprol XL 25 mg one tablet daily.  DISCHARGE ACTIVITY:  Patient is to avoid driving, strenuous activity and no lifting over 10 pounds.  DIET:  Low fat, low salt.  WOUND CARE:  The patient is told he can shower and clean his incision with soap and water.  DISPOSITION:   Home.  FOLLOW-UP:  The patient is told to call Dr. Loraine Grip office for appointment in two weeks.  He is told to bring his chest x-ray to Dr. Ysidro Evert office with him.  He was also instructed to follow up with Dr. Tyrone Sage at the [facility] office in three weeks.  Office will call him and verify the time and date of his appointment. DD:  03/01/01 TD:  03/03/01 Job: [id number]

## 2011-04-21 NOTE — Assessment & Plan Note (Signed)
[facility]                               PULMONARY OFFICE NOTE   David [name], David [name]                    MRN:          [id number]  DATE:07/30/2006                            DOB:          11/04/1941    HISTORY:  A 70 year old white male with COPD, felt to be mild to moderate on  his last visit on May 16, 2006, with symptoms disproportionate to the  severity of COPD that I thought probably represented upper airway  instability related to reflux and obesity.  He comes in today acutely worse  for five days with sore throat and fever without this time, significant  cough but having increased dyspnea and sweating with activity.  He has  already been seen and treated with Zithromax and prednisone and denies any  excess sputum production at present, classic reflux, fever, chills, sweats,  orthopnea, PND or leg swelling.   Medications were reviewed with the patient in detail in the column dated  July 30, 2006.  Note that the patient did not double the Prilosec dose as  I had recommended for any flare up of his symptoms.   PHYSICAL EXAMINATION:  GENERAL APPEARANCE:  He is an obese, pleasant,  ambulatory white male in no acute distress.  VITAL SIGNS:  Weight is 223 pounds, which is down 10 pounds previous visit.  Vital signs are unremarkable.  HEENT:  Remarkable for oropharynx clear.  NECK:  Supple without cervical adenopathy or tenderness.  Trachea is  midline.  No thyromegaly.  LUNGS:  Lung fields are clear except for prominent pseudowheeze.  CARDIOVASCULAR:  There is regular rate and rhythm without murmurs, rubs, or  gallops.  ABDOMEN:  Soft and benign.  EXTREMITIES:  Warm without calf tenderness, clubbing, cyanosis, or edema.   Chest x-ray is normal.   IMPRESSION:  This patient had much more prominent pseudowheeze than true  wheeze again today indicating to me the problem is more related to upper  airway and probably reflux than to chronic  obstructive pulmonary disease or  an asthmatic exacerbation.  Certainly this reflux did not cause the fever of  102, but this problem has been addressed with Zithromax as he has  defervesced and what probably happens with viral upper respiratory  infections is that the upper airway gets denuded to the point where any  reflux at all is too much reflux and results in sustained or exaggerated  symptoms of upper respiratory infection.   I tried to explain this all to the patient and emphasized the importance of  taking the Prilosec perfectly regular before meals twice daily and then I  did give him a course of Levaquin should he respike any fever or have any  purulent sputum after the Z pack is complete.   Pulmonary follow-up, however, can be p.r.n.                                   David [name]. David Sires, MD, FCCP   MBW/MedQ  DD:  07/30/2006  DT:  07/31/2006  Job #:  [id number]   cc:   David Ruths, MD

## 2011-04-21 NOTE — Op Note (Signed)
NAME:  David [name], David [name]             ACCOUNT NO.:  [id number]   MEDICAL RECORD NO.:  [id number]          PATIENT TYPE:  AMB   LOCATION:  DAY                           FACILITY:  [facility]   PHYSICIAN:  Lionel December, M.D.    DATE OF BIRTH:  15-Feb-1941   DATE OF PROCEDURE:  08/20/2006  DATE OF DISCHARGE:                                 OPERATIVE REPORT   PROCEDURE:  Esophagogastroduodenoscopy.   INDICATIONS:  [name] is a 70 year old Caucasian male with over five years of  ERD who also has epigastric pain.  He is undergoing EGD to rule out  complicated GERD as well as peptic ulcer disease.  The procedure and risks  were reviewed with the patient and informed consent was obtained.   MEDS FOR CONSCIOUS SEDATION:  Benzocaine spray for pharyngeal topical  anesthesia, Demerol 50 mg IV, Versed 4 mg IV.   FINDINGS:  The procedure was performed in the endoscopy suite.  The  patient's vital signs and O2 sats were monitored during the procedure and  remained stable.  The patient was placed in the left lateral position.  The  Olympus videoscope was passed via oropharynx without any difficulty into  esophagus.   Esophagus:  The mucosa of the esophagus was normal.  GE junction was wavy,  located at 40 cm from the incisors and hiatus was at 42.  He had a small  sliding hiatal hernia.   Stomach:  It was empty and distended very well with insufflation.  Folds of  the proximal stomach were normal.  Examination of the mucosa at body,  antrum, pyloric channel as well as angularis, fundus and cardia was normal.   Duodenum:  The bulbar mucosa was normal.  The scope was passed to the second  part of the duodenum where mucosa and folds were normal.  The endoscope was  withdrawn.  The patient tolerated the procedure well.   FINAL DIAGNOSIS:  Small sliding hiatal hernia, otherwise, normal  esophagogastroduodenoscopy.   RECOMMENDATIONS:  He will continue anti-reflux measures and Prilosec, as  before.  He will  call if upper abdominal pain recurs.      Lionel December, M.D.  Electronically Signed     NR/MEDQ  D:  08/20/2006  T:  08/21/2006  Job:  [id number]   cc:   Patrica Duel, M.D.  Fax: [phone]

## 2011-04-21 NOTE — Op Note (Signed)
NAME:  David [name], David [name]             ACCOUNT NO.:  [id number]   MEDICAL RECORD NO.:  [id number]          PATIENT TYPE:  AMB   LOCATION:  DAY                           FACILITY:  [facility]   PHYSICIAN:  Lionel December, M.D.    DATE OF BIRTH:  [birth year]   DATE OF PROCEDURE:  03/22/2006  DATE OF DISCHARGE:                                 OPERATIVE REPORT   PROCEDURE:  Colonoscopy.   ENDOSCOPIST:  Lionel December, M.D.   INDICATIONS:  Darius is a 70 year old Caucasian male who had a screening  colonoscopy in November 2003 with removal of 2 tubular adenomas; the one  from the rectum was a large polyp over 15 mm in diameter.  He is returning  for surveillance colonoscopy.  He does not have any symptoms.  Procedure and  risks were reviewed the patient and informed consent was obtained.   MEDICATIONS FOR CONSCIOUS SEDATION:  Demerol 25 mg IV, Versed 4 mg IV.   FINDINGS:  Procedure performed in endoscopy suite.  The patient's vital  signs and O2 SAT were monitored during the procedure and remained stable.  The patient was placed in the left lateral decubitus position and rectal  examination performed.  No abnormality was noted on external or digital  exam.  Olympus videoscope was placed in the rectum and advanced under direct  vision into sigmoid colon and beyond.  The preparation was excellent.  A few  scattered diverticula were noted at the sigmoid and transverse colon.  Scope  was passed to the cecum, which was identified by ileocecal valve and  appendiceal orifice.  Pictures were taken for the record.  As the scope was  withdrawn, colonic mucosa was carefully examined; there no polyps or other  mucosal abnormalities.  There was a scar at rectum without any recurrent or  residual polyp.  Pictures were taken for the record.  Scope was retroflexed  to examine anorectal junction, which was unremarkable.  Endoscope was  straightened and withdrawn.  The patient tolerated the procedure well.   FINAL  DIAGNOSES:  1.  A few scattered diverticula in sigmoid and transverse colon.  2.  No evidence of recurrent polyps.   RECOMMENDATIONS:  1.  High-fiber diet.  2.  Yearly Hemoccults.  3.  He should return for next exam in 5 years from now.      Lionel December, M.D.  Electronically Signed     NR/MEDQ  D:  03/22/2006  T:  03/23/2006  Job:  [id number]   cc:   [name], M.D.  Fax: [phone]

## 2011-04-21 NOTE — H&P (Signed)
NAME:  David [name], David [name]             ACCOUNT NO.:  [id number]   MEDICAL RECORD NO.:  [id number]          PATIENT TYPE:  AMB   LOCATION:  DAY                           FACILITY:  [facility]   PHYSICIAN:  Lionel December, M.D.    DATE OF BIRTH:  19-Oct-1941   DATE OF ADMISSION:  DATE OF DISCHARGE:  LH                                HISTORY & PHYSICAL   ADDENDUM:  This is an addendum to job [phone].   Patient plans on obtaining blood work through Dr. Mikey Bussing office next  week.  I have asked that he have any results faxed to our office.  If he  does not have a CBC done, we will obtain one at the time of his procedure.      Tana Coast, P.A.      Lionel December, M.D.  Electronically Signed    LL/MEDQ  D:  08/17/2006  T:  08/17/2006  Job:  [id number]

## 2011-04-21 NOTE — Cardiovascular Report (Signed)
[place]  Patient:    David [name], David [name]                    MRN: [id number] Proc. Date: 02/15/00 Adm. Date:  [id number] Attending:  Darlin Priestly CC:         Bryan Lemma. Jodelle Green, M.D., [place], [place].                        Cardiac Catheterization  PROCEDURE: 1. Left heart catheterization. 2. Coronary angiography. 3. Left ventriculogram. 4. Left anterior descending artery-proximal.    -Placement of intracoronary stent.  ATTENDING FOR THE CASE:  Lenise Herald, M.D.  COMPLICATIONS:  None.  INDICATIONS:  Mr. Stahnke is a 70 year old white male patient of Dr. Luciana Axe in [place], [place]., with a history of hypertension and tobacco abuse who was sent to our office on February 14, 2000, for an exercise treadmill test secondary to recent complaint of substernal chest pain migrating to both arms, occurring t rest.  He did undergo exercise treadmill test on February 14, 2000, which was early positive and is now referred for cardiac catheterization.  DESCRIPTION OF PROCEDURE:  After giving informed written consent, the patient was brought to the cardiac catheterization lab where his right and left groins were  shaved, prepped, and draped in the usual sterile fashion.  ECG monitoring was established.  Using a modified Seldinger technique, a 6-French arterial sheath as inserted in the right femoral artery.  The 6-French diagnostic catheters were then used to perform diagnostic angiography.  ANGIOGRAPHY: 1. This revealed a medium sized left main with no significant disease. 2. The LAD is a medium sized vessel which coursed to the apex and gave rise to    three diagonal branches.  The LAD is noted to have an 80% proximal stenotic    lesion just prior to the takeoff of the first diagonal.  The remainder of the    LAD has no significant disease.  The first diagonal is a medium sized vessel    which bifurcates in its mid portion and has no  significant disease.  The second    and third diagonals are small vessels with no significant disease. 3. The left circumflex is a medium sized vessel which coursed in the AV groove nd    gave rise to one large bifurcating obtuse marginal.  The AV groove circumflex    has no significant disease.  The first OM is a large vessel which bifurcates in    its mid portion and has no significant disease. 4. The right coronary artery is a medium sized vessel which is dominant and gives    rise to a PDA as well as a posterolateral branch.  The RCA is noted to have    40% proximal, 40% mid, and 40% distal stenotic lesions.  The PDA and    posterolateral branch are medium sized vessels with no significant disease.  The left ventriculogram reveals preserved EF measured at 50%.  There is no significant MR.  HEMODYNAMICS:  Systemic arterial pressure 118/65, LV systemic pressure 116/8, LVEDP of 12.  INTERVENTIONAL PROCEDURE:  LAD-proximal.  Following diagnostic angiography, a 7-French JL3.5 guiding catheter with sideholes was coaxially engaged in the left coronary ostium.  A selective coronary angiogram was then performed.  Next, a 0.014 Patriot exchange length guidewire was advanced out of the guiding catheter into the proximal LAD.  The guidewire was then used to cross the  proximal LAD stenotic lesion and was positioned in the distal LAD without difficulty.  After placement of the wire, the patient developed no flow in the LAD and had ST segment depression. Intracoronary nitroglycerin was then given with resolution of ST segments, and low was then reestablished down the LAD.  Next, a Tetra 3.0 x 8-mm coronary stent was then positioned across the proximal LAD stenotic lesion with careful attention ot to extend the stent into the left main.  The stent was then deployed to a maximum of 15 atmospheres for a total of 54 seconds.  Followup angiograms revealed no evidence of dissection or  thrombus with TIMI-3 flow to the distal vessel. Because of the complexity of the case, ReoPro was given.  Intravenous doses of heparin ere given to maintain the ACT between 200-300.  Final orthogonal angiograms revealed less than 10% residual stenosis in the proximal LAD with TIMI-3 flow to the distal vessel and no evidence of dissection or thrombus.  At this point, we elected to conclude the procedure.  All balloons, wires, and catheters were removed.  A hemostatic sheath was sewn in place, and he patient was transferred back to the ward in stable condition and to continue with ReoPro infusion to complete 12 hours.  CONCLUSIONS: 1. Successful placement of a Guidant 3.0 x 8-mm Tetra coronary stent in the    proximal LAD stenotic lesion. 2. Low normal LV systolic function. 3. Adjunct use of ReoPro (abciximab) infusion. DD:  02/15/00 TD:  02/15/00 Job: [id number]

## 2011-04-21 NOTE — Cardiovascular Report (Signed)
[name]. [facility]  Patient:    David [name], David [name]                    MRN: [id number] Proc. Date: 02/25/01 Adm. Date:  [id number] Attending:  Darlin Priestly CC:         Luciana Axe, M.D., [place], [place]   Cardiac Catheterization  PROCEDURES: 1. Left heart catheterization. 2. Coronary angiography. 3. Left ventriculogram.  COMPLICATIONS:  None.  INDICATIONS:  David [name] is a 70 year old, white male, patient of Dr. Luciana Axe with a history of CAD, status post stenting of his LAD on February 15, 2000, using a Tetra 3.0 x 8 mm stent.  He also has a history of tobacco abuse and mild hypertension.  The patient recently has complained of atypical chest pain and underwent stress Cardiolite revealing evidence of anterior wall ischemia.  He is now referred for repeat cardiac catheterization to redefine his coronary anatomy.  DESCRIPTION OF PROCEDURE:  After given informed written consent, the patient was brought to the cardiac catheterization lab where his right and left groins were shaved, prepped, and draped in the usual sterile fashion.  ECG monitoring was established.  Using modified Seldinger technique, a #6 French arterial sheath was inserted in the right femoral artery.  A 6 French diagnostic catheter was then used to perform diagnostic angiography.  This reveals a large left main with no significant disease.  The LAD is a medium sized vessel which coursed to the apex and gave rise to two diagonal branches.  The LAD is noted to have a stent in its proximal portion with diffuse in-stent re-stenosis involving the proximal stent extending back to the ostium of the LAD.  This extends just distal to placement of the stent.  The first and second diagonals are small vessels with no significant disease.  The left circumflex is a medium sized vessel which coursed in the AV groove and gave rise to two obtuse marginal branches.  The AV groove circumflex  has no significant disease.  The first and second OMs are medium sized vessels with no significant disease.  The right coronary artery is a large vessel which is dominant and gives rise to both the PDA as well as the posterolateral branch.  The RCA is noted to have 40% proximal, 30% mid and course irregularities throughout the distal portion.  The PDA and posterolateral branch have no significant disease.  LEFT VENTRICULOGRAM:  The left ventriculogram reveals preserved EF at 60%.  HEMODYNAMICS:  Systemic arterial pressure 133/99, LV systemic pressure 133/16, LVEDP of 19.  CONCLUSIONS: 1. Significant one-vessel coronary artery disease. 2. Normal left ventricular systolic function. DD:  02/25/01 TD:  02/25/01 Job: [id number]

## 2011-04-21 NOTE — Op Note (Signed)
[place]. [facility]  Patient:    David [name], David [name]                    MRN: [id number] Proc. Date: 02/26/01 Adm. Date:  [id number] Disc. Date: [id number] Attending:  Waldo Laine CC:         Lenise Herald, M.D.   Operative Report  PREOPERATIVE DIAGNOSIS:  Coronary occlusive disease, status post angioplasty and stent placement in the left anterior descending coronary artery.  POSTOPERATIVE DIAGNOSIS:  Coronary occlusive disease, status post angioplasty and stent placement in the left anterior descending coronary artery.  PROCEDURE:  Off-pump coronary artery bypass grafting x 1 with the left internal mammary to the left anterior descending coronary artery.  SURGEON:  Gwenith Daily. Tyrone Sage, M.D.  FIRST ASSISTANT:  Carlye Grippe.  BRIEF HISTORY:  Patient is a 70 year old male who approximately one year prior presented with high-grade proximal LAD lesion.  At that time he underwent angioplasty and stent placement.  Since the incident, he has lost weight and exercised and worked on diabetes control.  Although he had been relatively free of symptoms, a follow-up Cardiolite stress test was done, which as markedly positive for anterior ischemia.  Repeat cardiac catheterization was done by Dr. Jenne Campus, which showed a greater than 95% in-stent stenosis of the proximal LAD overall with preserved LV function.  He also had luminal irregularities of the right coronary artery.  The circumflex coronary artery was patent.  Coronary artery bypass grafting was recommended to the patient, who agreed and signed informed consent.  DESCRIPTION OF PROCEDURE:  With Swan-Ganz and arterial line monitors in place, the patient underwent general endotracheal anesthesia without incident.  Skin of the chest and legs was prepped with Betadine and draped in the usual sterile manner.  The left internal mammary artery was dissected down as a pedicle graft.  The distal  artery was divided and had excellent, free flow. The vessel was hydrostatically dilated with heparinized saline.  The pericardium was opened, and retraction sutures were placed in the pericardium. The LAD was easily identified, and it was decided to proceed with off-pump bypass.  A platform-type stabilizer was used.  Elastic tapes were placed after slight nick of the epicardium proximally and distally to the area selected. With the platform stabilization device in place and the patients head in a down position, he tolerated this without any hemodynamic instability.  Using a Beaver blade, the LAD was prior to occlusion of the vessels, the patient had been systemically heparinized.  The LAD was opened, and elastic tapes were used to control bleeding.  With the patient hemodynamically stable, the left internal mammary artery was anastomosed to the left anterior descending coronary artery with a running 8-0 Prolene.  Prior to complete closure of the vessel, backbleeding was allowed to occur.  There was excellent flow down the mammary.  The anastomosis was completed.  Elastic tapes were removed and the stabilizer remvoed.  The patient tolerated this without difficulty.  The fascia of the mammary artery was tacked to the epicardium.  The anastomosis itself appeared patent and intact and without bleeding.  Protamine was reversed.  Atrial and ventricular pacing wires were applied.  A left pleural tube and two mediastinal tubes left in place.  The pericardium was loosely reapproximated.  Sternum was closed with #6 stainless steel wire, fascia closed with interrupted 0 Vicryl, running 3-0 Vicryl in subcutaneous tissue, 4-0 subcuticular stitch in the skin edges.  Dry dressings were  applied. Sponge and needle count was reported as correct at the conclusion of the procedure.  The patient tolerated the procedure without obvious complication and was transferred to the surgical intensive care unit for  further postoperative care. DD:  03/11/01 TD:  03/11/01 Job: [id number]

## 2011-04-21 NOTE — Consult Note (Signed)
NAME:  Schor, Angad             ACCOUNT NO.:  [id number]   MEDICAL RECORD NO.:  [id number]          PATIENT TYPE:  AMB   LOCATION:                                FACILITY:  [facility]   PHYSICIAN:  Lionel December, M.D.    DATE OF BIRTH:  01-Aug-1941   DATE OF CONSULTATION:  08/17/2006  DATE OF DISCHARGE:                                   CONSULTATION   CHIEF COMPLAINT:  Blood in the stool, abdominal pain.   HISTORY OF PRESENT ILLNESS:  Mr. Bua is a 70 year old gentleman who  presents for further evaluation of recurrent abdominal pain and  hematochezia.  He has a history of adenomatous polyps.  His last colonoscopy  was March 22, 2006, at which time he had a few scattered diverticula in the  sigmoid and transverse colon, but no evidence of recurrent polyps.  He was  asked to come back in 5 years for repeat examination.   He states that he has had recurrent mid epigastric pain, associated with  bloody stools.  This occurs sometimes once every month or two.  He describes  the blood as large volume and fresh.  Denies any blood clots.  Bowel  movements occur generally 1-2 times a day with formed stool.  At times he  notes that his hemorrhoids are irritated, and does have some bleeding from  them.  He was concerned may have developed an ulcer, and stopped his aspirin  325 mg daily.  Now he is taking 81 mg 3-4 times a week.  He also had been on  some NSAIDs earlier a few months ago because of rotator cuff injury.  He has  since undergone left rotator cuff repair.  He has chronic GERD and has never  had an upper endoscopy.  Denies any dysphagia or odynophagia.  Denies any  nausea or vomiting.   CURRENT MEDICATIONS:  1. Coreg 3.125 mg daily.  2. Prilosec 20 mg daily.  3. Avelide 300/12.5 mg daily.  4. Pravastatin 400 mg daily.  5. Aspirin 81 mg 3x weekly.   ALLERGIES:  NO KNOWN DRUG ALLERGIES.   PAST MEDICAL HISTORY:  1. History of tubular adenomas in 2003; one from the rectum with  large      polyp over 15 mm.  Last colonoscopy revealed diverticula, as outlined      above.  No recurrent polyps.  2. History of coronary artery disease, status post one stent placement in      March 2001.  He has occlusion of the stent and underwent one-vessel      coronary artery bypass in March 2002.  3. Hypercholesterolemia.  4. Gastroesophageal reflux disease.  5. Prior left knee arthroscopy for meniscal tear.  6. Recent left rotator cuff repair.   FAMILY HISTORY:  Negative for colorectal cancer, chronic GI illnesses.   SOCIAL HISTORY:  He is married with 2 grandchildren.  Quit smoking years  ago.  Ocassionally consumes alcohol.   REVIEW OF SYSTEMS:  See HPI for GI.  Constitutional:  No weight loss.  Cardiopulmonary:  No chest pain or shortness of breath.  PHYSICAL EXAMINATION:  VITAL SIGNS:  Weight 227 (from 218.5 in December  2004), blood pressure 124/74, pulse 84, temperature 98.7.  GENERAL:  A pleasant, well nourished, well developed Caucasian male in no  acute distress.  SKIN:  Warm and dry.  No jaundice.  HEENT:  Conjunctivae are pink. Sclerae nonicteric.  __________ Mucosa moist,  pink and no lesions or exudates.  CHEST:  Lungs are clear to auscultation.  CARDIAC:  Reveals regular rate and rhythm; normal S1, S2.  No murmurs, rubs  or gallops.  ABDOMEN:  Positive bowel sound; soft nontender and nondistended.  No  organomegaly or masses.  No rebound after use of __________ .  No abdominal  bruits or hernias.  EXTREMITIES:  No edema.   IMPRESSION:  Mr. Vanblarcom is a 70 year old gentleman with intermittent large  volume hematochezia; associated with mid epigastric pain.  He has chronic  GERD.  He has been on NSAID within the last few months for a digital  orthopedic injury.  He has recently discontinued his aspirin 325 mg daily;  he is down to 81 mg 3x a week.  He is concerned that he may have peptic  ulcer disease.  I explained to him that fresh blood per rectum  usually is  related to anorectal sores such as hemorrhoids.  Given that he has described  as large volume with mid epigastric pain in the setting, will go ahead and  exclude upper GI source.  Also rule out complications of chronic GERD, such  as Barrett's esophagus.   PLAN:  1. EGD in the near future.  2. Will go ahead and treat his hemorrhoids with Anusol AC suppositories 25      mg one per rectum q.h.s. for 14 days (0 refills).  3. Continue Prilosec 20 mg daily, as before.      Tana Coast, P.A.      Lionel December, M.D.  Electronically Signed    LL/MEDQ  D:  08/17/2006  T:  08/17/2006  Job:  [id number]

## 2011-04-21 NOTE — Discharge Summary (Signed)
[name]. [facility]  Patient:    David [name], David [name]                    MRN: [id number] Adm. Date:  [id number] Disc. Date: [id number] Attending:  Lenise Herald H Dictator:   Marya Fossa, P.A. CC:         Dr. Raliegh Ip, M.D.                           Discharge Summary  ADMISSION DIAGNOSES: 1. Abnormal exercise treadmill test with early positive ST segment depression in    the inferolateral leads of 1.5-2 mm. 2. No known coronary artery disease. 3. Strong tobacco history of 1-1/2 to 2 packs a day and alcohol use. 4. Hypertension. 5. Non-insulin-dependent diabetes mellitus, diet controlled. 6. Family history of coronary artery disease.  DISCHARGE DIAGNOSES: 1. Abnormal treadmill test, status post cardiac catheterization February 15, 2000    revealing high-grade left anterior descending proximal disease and noncritical    right coronary artery disease.  Status post percutaneous coronary intervention    left anterior descending. 2. No known coronary artery disease. 3. Strong tobacco history of 1-1/2 to 2 packs a day and alcohol use. 4. Hypertension. 5. Non-insulin-dependent diabetes mellitus, diet controlled. 6. Family history of coronary artery disease.  HISTORY OF PRESENT ILLNESS:  Mr. Eads is a 70 year old white male patient of Dr. Jenne Campus and Dr. Jodelle Green who has a history of hypertension dating back to July 2000, as well as insulin-dependent diabetes mellitus, diet controlled.  He has  strong smoking history, smoking 1-1/2 packs a day for the last 35-40 years but uit about 2-1/2 years ago.  He also has a positive family history of coronary artery disease.  Over the last three to four weeks, the patient complained of substernal chest pain awakening him from night and radiating to both elbows.  No CHF symptoms. Since that time, he has stopped exercising and has not noted any exertional angina symptoms.  He has no  prior cardiac history.  He underwent an exercise stress test in the office, which was early positive, with 1.5-2 mm ST segment depression in the inferolateral leads.  He is thus scheduled for a cardiac catheterization to rule out significant coronary artery disease.   PROCEDURES:  Cardiac catheterization February 15, 2000 by Dr. Lenise Herald.  COMPLICATIONS:  None.  CONSULTATIONS:  None.  HOSPITAL COURSE:  Mr. Diop was admitted and precatheterization laboratory studies were within normal limits.  EKG shows normal sinus rhythm with nonspecific ST-T wave abnormalities in V1-3, 1 aVL.  Cardiac catheterization was performed on February 15, 2000 by Dr. Lenise Herald revealing a normal left main, 80% proximal LAD with otherwise clean mid and distal LAD and clean diagonals, normal circumflex and obtuse marginal, and an RCA with 40% lesions in mid, proximal, and distal portions.  The LV 50% with no significant R.  Dr. Jenne Campus proceeded with PCI of the proximal LAD using a Tetra 3.0/8 stent. ReoPro was bolused and infused.  The patient tolerated the procedure well and there were no problems with sheath  pull.  Post-catheterization laboratory studies remained stable and the patient was deemed stable for discharge to home.  DISCHARGE MEDICATIONS: 1. Lipitor 10 mg p.o. q.d. 2. Toprol XL 25 mg a day. 3. Enteric-coated aspirin 325 mg q.d. 325 mg a day. 4. Plavix 75 mg  a day. 5. Vitamin E 400 international units a day. 6. Vitamin C 500 mg b.i.d. 7. Sublingual nitroglycerin as needed for chest pain.  DISCHARGE INSTRUCTIONS:  The patient should do no strenuous activity, sexual activity, lifting more than 5 pounds, or driving for the next two days.  He should follow a low fat/low cholesterol/low salt diabetic diet.  He may shower but should not soak in the tub for one week.  He is to call the office with any problems or questions.  FOLLOW-UP:  A followup appointment will be  scheduled with Dr. Lenise Herald in one to two weeks post discharge. DD:  02/16/00 TD:  02/16/00 Job: [id number]

## 2011-05-02 ENCOUNTER — Other Ambulatory Visit: Payer: Self-pay | Admitting: Cardiovascular Disease

## 2011-07-10 ENCOUNTER — Encounter: Payer: Self-pay | Admitting: Cardiovascular Disease

## 2011-07-14 ENCOUNTER — Telehealth (INDEPENDENT_AMBULATORY_CARE_PROVIDER_SITE_OTHER): Payer: Self-pay | Admitting: *Deleted

## 2011-07-14 NOTE — Telephone Encounter (Signed)
TCS resch' to 09/13/11 @ 12:00 (11:00), asa 2 days, hold pm dose of metformin day before, movi prep instructions mailed

## 2011-08-08 ENCOUNTER — Encounter: Payer: Self-pay | Admitting: Cardiovascular Disease

## 2011-08-08 ENCOUNTER — Ambulatory Visit (INDEPENDENT_AMBULATORY_CARE_PROVIDER_SITE_OTHER): Payer: Medicare Other | Admitting: Cardiovascular Disease

## 2011-08-08 VITALS — BP 146/72 | HR 72 | Ht 69.0 in | Wt 218.0 lb

## 2011-08-08 DIAGNOSIS — E785 Hyperlipidemia, unspecified: Secondary | ICD-10-CM

## 2011-08-08 DIAGNOSIS — I10 Essential (primary) hypertension: Secondary | ICD-10-CM

## 2011-08-08 DIAGNOSIS — I25810 Atherosclerosis of coronary artery bypass graft(s) without angina pectoris: Secondary | ICD-10-CM

## 2011-08-08 DIAGNOSIS — I2510 Atherosclerotic heart disease of native coronary artery without angina pectoris: Secondary | ICD-10-CM

## 2011-08-08 NOTE — Assessment & Plan Note (Signed)
Lipids are followed by his primary physician. The patient is tolerating low-dose pravastatin.

## 2011-08-08 NOTE — Assessment & Plan Note (Signed)
Continue observation for now. The patient is on a combination of amlodipine, valsartan, and long-acting metoprolol. Blood pressure has not been consistently elevated.

## 2011-08-08 NOTE — Progress Notes (Signed)
HPI:  70 year old gentleman returns for followup evaluation. The patient has coronary artery disease status post coronary bypass surgery. He underwent a LIMA to LAD revascularization. He had cardiac catheterization earlier this year after an abnormal treadmill study. This demonstrated severe proximal LAD stenosis with a patent mammary graft to the mid LAD. Medical management has been recommended.  The patient is doing well at present. He denies chest pain or pressure. He denies dyspnea, edema, orthopnea, or PND. His initial blood pressure was elevated in the office today, but he notes that his last physician visit just a few weeks ago his blood pressure was 120/70. He reports no recent changes in his medications.  Outpatient Encounter Prescriptions as of 08/08/2011  Medication Sig Dispense Refill  . amLODipine-valsartan (EXFORGE) 5-160 MG per tablet Take 1 tablet by mouth daily.        Marland Kitchen aspirin 81 MG tablet Take 81 mg by mouth daily.        . hydroxychloroquine (PLAQUENIL) 200 MG tablet Take 200 mg by mouth 2 (two) times daily.        . metFORMIN (GLUCOPHAGE) 500 MG tablet Take 500 mg by mouth 2 (two) times daily with a meal.        . omeprazole (PRILOSEC) 20 MG capsule Take 20 mg by mouth daily.        . pravastatin (PRAVACHOL) 20 MG tablet Take 20 mg by mouth daily.        . sitaGLIPtin (JANUVIA) 100 MG tablet Take 100 mg by mouth daily.        . TOPROL XL 25 MG 24 hr tablet TAKE 1 TABLET BY MOUTH   ONCE A DAY.  30 each  3    Not on File  Past Medical History  Diagnosis Date  . Coronary artery disease     s/p PCI 2000 and single vessel CABG 2001  . Hypertension   . Hypercholesterolemia   . GERD (gastroesophageal reflux disease)   . Hiatal hernia     small sliding  . Tubular adenoma 2003    ROS: Negative except as per HPI  BP 146/72  Pulse 72  Ht 5\' 9"  (1.753 m)  Wt 218 lb (98.884 kg)  BMI 32.19 kg/m2  PHYSICAL EXAM: Pt is alert and oriented, overweight male in NAD HEENT:  normal Neck: JVP - normal, carotids 2+= without bruits Lungs: CTA bilaterally CV: RRR without murmur or gallop Abd: soft, NT, Positive BS, no hepatomegaly Ext: no C/C/E, distal pulses intact and equal Skin: warm/dry no rash  EKG:  Normal sinus rhythm 72 beats per minute, nonspecific ST and T wave abnormality.  ASSESSMENT AND PLAN:

## 2011-08-08 NOTE — Assessment & Plan Note (Signed)
The patient is stable without angina. We'll continue his current medical program and followup in 12 months.

## 2011-08-08 NOTE — Patient Instructions (Signed)
Your physician wants you to follow-up in:  12 months.  You will receive a reminder letter in the mail two months in advance. If you don't receive a letter, please call our office to schedule the follow-up appointment.   

## 2011-08-16 ENCOUNTER — Encounter (INDEPENDENT_AMBULATORY_CARE_PROVIDER_SITE_OTHER): Payer: Medicare Other | Admitting: Internal Medicine

## 2011-09-04 ENCOUNTER — Other Ambulatory Visit: Payer: Self-pay | Admitting: Cardiovascular Disease

## 2011-09-05 ENCOUNTER — Other Ambulatory Visit: Payer: Self-pay | Admitting: Cardiovascular Disease

## 2011-09-05 MED ORDER — AMLODIPINE BESYLATE-VALSARTAN 5-160 MG PO TABS: 1.0000 | ORAL_TABLET | Freq: Every day | ORAL | Status: DC

## 2011-09-12 MED ORDER — SODIUM CHLORIDE 0.45 % IV SOLN
Freq: Once | INTRAVENOUS | Status: AC
  Administered 2011-09-13: 11:00:00 via INTRAVENOUS

## 2011-09-13 ENCOUNTER — Encounter (HOSPITAL_COMMUNITY): Payer: Self-pay | Admitting: *Deleted

## 2011-09-13 ENCOUNTER — Ambulatory Visit (HOSPITAL_COMMUNITY)
Admission: RE | Admit: 2011-09-13 | Discharge: 2011-09-13 | Disposition: A | Discharge status: Home / Self Care | Payer: Medicare Other | Source: Ambulatory Visit | Attending: Internal Medicine | Admitting: Internal Medicine

## 2011-09-13 ENCOUNTER — Other Ambulatory Visit (INDEPENDENT_AMBULATORY_CARE_PROVIDER_SITE_OTHER): Payer: Self-pay | Admitting: Internal Medicine

## 2011-09-13 ENCOUNTER — Encounter (HOSPITAL_COMMUNITY)
Admission: RE | Disposition: A | Discharge status: Home / Self Care | Payer: Self-pay | Source: Ambulatory Visit | Attending: Internal Medicine

## 2011-09-13 DIAGNOSIS — D126 Benign neoplasm of colon, unspecified: Secondary | ICD-10-CM

## 2011-09-13 DIAGNOSIS — Z860100 Personal history of colon polyps, unspecified: Secondary | ICD-10-CM | POA: Insufficient documentation

## 2011-09-13 DIAGNOSIS — Z09 Encounter for follow-up examination after completed treatment for conditions other than malignant neoplasm: Secondary | ICD-10-CM | POA: Insufficient documentation

## 2011-09-13 DIAGNOSIS — E7800 Pure hypercholesterolemia, unspecified: Secondary | ICD-10-CM | POA: Insufficient documentation

## 2011-09-13 DIAGNOSIS — Z1211 Encounter for screening for malignant neoplasm of colon: Secondary | ICD-10-CM

## 2011-09-13 DIAGNOSIS — Z79899 Other long term (current) drug therapy: Secondary | ICD-10-CM | POA: Insufficient documentation

## 2011-09-13 DIAGNOSIS — Z7982 Long term (current) use of aspirin: Secondary | ICD-10-CM | POA: Insufficient documentation

## 2011-09-13 DIAGNOSIS — I10 Essential (primary) hypertension: Secondary | ICD-10-CM | POA: Insufficient documentation

## 2011-09-13 HISTORY — PX: COLONOSCOPY: SHX5424

## 2011-09-13 SURGERY — COLONOSCOPY
Anesthesia: Moderate Sedation

## 2011-09-13 MED ORDER — MEPERIDINE HCL 50 MG/ML IJ SOLN
INTRAMUSCULAR | Status: AC
  Filled 2011-09-13: qty 1

## 2011-09-13 MED ORDER — MIDAZOLAM HCL 5 MG/5ML IJ SOLN
INTRAMUSCULAR | Status: AC
  Filled 2011-09-13: qty 10

## 2011-09-13 MED ORDER — MIDAZOLAM HCL 5 MG/5ML IJ SOLN
INTRAMUSCULAR | Status: DC | PRN
  Administered 2011-09-13: 1 mg via INTRAVENOUS
  Administered 2011-09-13: 2 mg via INTRAVENOUS

## 2011-09-13 MED ORDER — MEPERIDINE HCL 50 MG/ML IJ SOLN
INTRAMUSCULAR | Status: DC | PRN
  Administered 2011-09-13 (×2): 25 mg via INTRAVENOUS

## 2011-09-13 MED ORDER — SODIUM CHLORIDE 0.9 % IR SOLN
Status: DC | PRN
  Administered 2011-09-13: 8 mL

## 2011-09-13 NOTE — H&P (Signed)
David [name] is an 70 y.o. male.   Chief Complaint: Patient is here for colonoscopy. HPI: Patient is 70 year old Caucasian who has history of colonic polyps tubular adenomas removed 2003. One was over 15 mm. His last exam was in 2007 and was unremarkable. He denies abdominal pain change in his bowel habits or rectal bleeding. His family history is negative for colorectal carcinoma.  Past Medical History  Diagnosis Date  . Coronary artery disease     s/p PCI 2000 and single vessel CABG 2001  . Hypertension   . Hypercholesterolemia   . GERD (gastroesophageal reflux disease)   . Tubular adenoma 2003    Past Surgical History  Procedure Date  . Left rotator cuff repair   . Left knee arthroscopy      for meniscal tear  . Coronary stent placement 2001  . Right knee arthroscopy   . Bilateral carpal tunnel release   . Right shoulder arthroscopy   . Coronary artery bypass graft     occlusion of the stent and underwent one-vessel CABG in 2002  . Coronary angioplasty     Family History  Problem Relation Age of Onset  . Heart failure Mother   . Cancer Father     lung canger  . Cancer Brother     lung cancer   Social History:  reports that he has quit smoking. His smoking use included Cigarettes. He has a 60 pack-year smoking history. He does not have any smokeless tobacco history on file. He reports that he drinks about 7 ounces of alcohol per week. He reports that he does not use illicit drugs.  Allergies: No Known Allergies  Medications Prior to Admission  Medication Dose Route Frequency Provider Last Rate Last Dose  . 0.45 % sodium chloride infusion   Intravenous Once Malissa Hippo, MD 20 mL/hr at 09/13/11 1127    . meperidine (DEMEROL) 50 MG/ML injection           . midazolam (VERSED) 5 MG/5ML injection            Medications Prior to Admission  Medication Sig Dispense Refill  . aspirin EC 81 MG tablet Take 81 mg by mouth daily.        . metoprolol succinate (TOPROL XL)  25 MG 24 hr tablet        . acetaminophen (TYLENOL) 500 MG tablet Take 500-1,000 mg by mouth every 6 (six) hours as needed. For pain        No results found for this or any previous visit (from the past 48 hour(s)). No results found.  Review of Systems  Constitutional: Negative for weight loss.  Gastrointestinal: Negative for abdominal pain, diarrhea, constipation, blood in stool and melena.    Blood pressure 136/85, pulse 99, temperature 97.5 F (36.4 C), temperature source Oral, resp. rate 26, height 5\' 9"  (1.753 m), weight 202 lb (91.627 kg), SpO2 98.00%. Physical Exam  Constitutional: He appears well-developed and well-nourished.  HENT:  Mouth/Throat: Oropharynx is clear and moist.  Eyes: Conjunctivae are normal. No scleral icterus.  Neck: No thyromegaly present.  Cardiovascular: Normal rate, regular rhythm and normal heart sounds.  Exam reveals no gallop.   Respiratory: Effort normal and breath sounds normal.  GI: Soft. Bowel sounds are normal. He exhibits no distension and no mass. There is no tenderness.  Musculoskeletal: He exhibits no edema.  Lymphadenopathy:    He has no cervical adenopathy.  Neurological: He is alert.  Skin: Skin is warm and  dry.     Assessment/Plan History of colonic polyps. Surveillance colonoscopy.  Kathlene Yano U 09/13/2011, 11:30 AM

## 2011-09-13 NOTE — Op Note (Signed)
COLONOSCOPY PROCEDURE REPORT  PATIENT:  David [name]  MR#:  [id number] Birthdate:  17-Sep-1941, 70 y.o., male Endoscopist:  Dr. Malissa Hippo, MD Referred By:  Dr. [name], MD Procedure Date: 09/13/2011  Procedure:   Colonoscopy with snare polypectomy.  Indications: Patient is 70 year old Caucasian male with history of colonic adenomas. He is here for surveillance exam. His last colonoscopy was in 2007.  Informed Consent: Procedure and risks were reviewed with the patient and informed consent was obtained.  Medications:  Demerol 50 mg IV Versed 3 mg IV  Description of procedure:  After a digital rectal exam was performed, that colonoscope was advanced from the anus through the rectum and colon to the area of the cecum, ileocecal valve and appendiceal orifice. The cecum was deeply intubated. These structures were well-seen and photographed for the record. From the level of the cecum and ileocecal valve, the scope was slowly and cautiously withdrawn. The mucosal surfaces were carefully surveyed utilizing scope tip to flexion to facilitate fold flattening as needed. The scope was pulled down into the rectum where a thorough exam including retroflexion was performed.  Findings:   Prep excellent. 8 mm sessile polyp at splenic flexure. Saline-assisted polypectomy performed.. Mucosa of rest of the colon was normal. Scar at rectum from previous polypectomy noted. Unremarkable anorectal junction.  Therapeutic/Diagnostic Maneuvers Performed:  See above  Complications:  None  Cecal Withdrawal Time:  19 minutes  Impression:  Examination performed to cecum. 8 mm sessile polyp snared from splenic flexure after elevating it with saline injection. Rectal scar from previous polypectomy.  Recommendations:  Standard instructions given. No aspirin for one week. I will be contacting patient with results of biopsy.  [name]  09/13/2011 12:13 PM  CC: Dr. [name], MD & Dr. Bonnetta Barry ref. provider found

## 2011-09-14 LAB — GLUCOSE, CAPILLARY: Glucose-Capillary: 134 mg/dL — ABNORMAL HIGH (ref 70–99)

## 2011-09-19 ENCOUNTER — Encounter (HOSPITAL_COMMUNITY): Payer: Self-pay

## 2011-09-19 ENCOUNTER — Encounter (HOSPITAL_COMMUNITY)
Admission: RE | Admit: 2011-09-19 | Discharge: 2011-09-19 | Disposition: A | Discharge status: Home / Self Care | Payer: Medicare Other | Source: Ambulatory Visit | Attending: Ophthalmology | Admitting: Ophthalmology

## 2011-09-19 HISTORY — DX: Chronic obstructive pulmonary disease, unspecified: J44.9

## 2011-09-19 HISTORY — DX: Unspecified osteoarthritis, unspecified site: M19.90

## 2011-09-19 LAB — BASIC METABOLIC PANEL
BUN: 10 mg/dL (ref 6–23)
CO2: 29 mEq/L (ref 19–32)
Calcium: 9.6 mg/dL (ref 8.4–10.5)
Glucose, Bld: 136 mg/dL — ABNORMAL HIGH (ref 70–99)
Sodium: 136 mEq/L (ref 135–145)

## 2011-09-19 LAB — CBC
HCT: 41.4 % (ref 39.0–52.0)
Hemoglobin: 14 g/dL (ref 13.0–17.0)
MCH: 31 pg (ref 26.0–34.0)
MCHC: 33.8 g/dL (ref 30.0–36.0)
RBC: 4.51 MIL/uL (ref 4.22–5.81)

## 2011-09-19 NOTE — Patient Instructions (Addendum)
20 David [name]  09/19/2011   Your procedure is scheduled on:  09/25/2011  Report to [facility] at  1000  AM.  Call this number if you have problems the morning of surgery: [phone]   Remember:   Do not eat food:After Midnight.  Do not drink clear liquids: After Midnight.  Take these medicines the morning of surgery with A SIP OF WATER: lopressor,exforge,prolisec,toprol xl   Do not wear jewelry, make-up or nail polish.  Do not wear lotions, powders, or perfumes. You may wear deodorant.  Do not shave 48 hours prior to surgery.  Do not bring valuables to the hospital.  Contacts, dentures or bridgework may not be worn into surgery.  Leave suitcase in the car. After surgery it may be brought to your room.  For patients admitted to the hospital, checkout time is 11:00 AM the day of discharge.   Patients discharged the day of surgery will not be allowed to drive home.  Name and phone number of your driver: family  Special Instructions: N/A   Please read over the following fact sheets that you were given: Pain Booklet, Surgical Site Infection Prevention, Anesthesia Post-op Instructions and Care and Recovery After Surgery PATIENT INSTRUCTIONS POST-ANESTHESIA  IMMEDIATELY FOLLOWING SURGERY:  Do not drive or operate machinery for the first twenty four hours after surgery.  Do not make any important decisions for twenty four hours after surgery or while taking narcotic pain medications or sedatives.  If you develop intractable nausea and vomiting or a severe headache please notify your doctor immediately.  FOLLOW-UP:  Please make an appointment with your surgeon as instructed. You do not need to follow up with anesthesia unless specifically instructed to do so.  WOUND CARE INSTRUCTIONS (if applicable):  Keep a dry clean dressing on the anesthesia/puncture wound site if there is drainage.  Once the wound has quit draining you may leave it open to air.  Generally you should leave the bandage  intact for twenty four hours unless there is drainage.  If the epidural site drains for more than 36-48 hours please call the anesthesia department.  QUESTIONS?:  Please feel free to call your physician or the hospital operator if you have any questions, and they will be happy to assist you.     [facility] Anesthesia Department [address] [place] [phone]

## 2011-09-20 ENCOUNTER — Encounter (HOSPITAL_COMMUNITY): Payer: Self-pay | Admitting: Internal Medicine

## 2011-09-20 ENCOUNTER — Encounter (INDEPENDENT_AMBULATORY_CARE_PROVIDER_SITE_OTHER): Payer: Self-pay | Admitting: *Deleted

## 2011-09-25 ENCOUNTER — Encounter (HOSPITAL_COMMUNITY): Payer: Self-pay | Admitting: Anesthesiology

## 2011-09-25 ENCOUNTER — Encounter (HOSPITAL_COMMUNITY)
Admission: RE | Disposition: A | Discharge status: Home / Self Care | Payer: Self-pay | Source: Ambulatory Visit | Attending: Ophthalmology

## 2011-09-25 ENCOUNTER — Ambulatory Visit (HOSPITAL_COMMUNITY)
Admission: RE | Admit: 2011-09-25 | Discharge: 2011-09-25 | Disposition: A | Discharge status: Home / Self Care | Payer: Medicare Other | Source: Ambulatory Visit | Attending: Ophthalmology | Admitting: Ophthalmology

## 2011-09-25 ENCOUNTER — Ambulatory Visit (HOSPITAL_COMMUNITY): Payer: Medicare Other | Admitting: Anesthesiology

## 2011-09-25 ENCOUNTER — Encounter (HOSPITAL_COMMUNITY): Payer: Self-pay | Admitting: Ophthalmology

## 2011-09-25 DIAGNOSIS — Z7982 Long term (current) use of aspirin: Secondary | ICD-10-CM | POA: Insufficient documentation

## 2011-09-25 DIAGNOSIS — H2510 Age-related nuclear cataract, unspecified eye: Secondary | ICD-10-CM | POA: Insufficient documentation

## 2011-09-25 DIAGNOSIS — Z01812 Encounter for preprocedural laboratory examination: Secondary | ICD-10-CM | POA: Insufficient documentation

## 2011-09-25 DIAGNOSIS — Z79899 Other long term (current) drug therapy: Secondary | ICD-10-CM | POA: Insufficient documentation

## 2011-09-25 DIAGNOSIS — J4489 Other specified chronic obstructive pulmonary disease: Secondary | ICD-10-CM | POA: Insufficient documentation

## 2011-09-25 DIAGNOSIS — J449 Chronic obstructive pulmonary disease, unspecified: Secondary | ICD-10-CM | POA: Insufficient documentation

## 2011-09-25 DIAGNOSIS — E119 Type 2 diabetes mellitus without complications: Secondary | ICD-10-CM | POA: Insufficient documentation

## 2011-09-25 DIAGNOSIS — I10 Essential (primary) hypertension: Secondary | ICD-10-CM | POA: Insufficient documentation

## 2011-09-25 HISTORY — PX: CATARACT EXTRACTION W/PHACO: SHX586

## 2011-09-25 SURGERY — PHACOEMULSIFICATION, CATARACT, WITH IOL INSERTION
Anesthesia: Monitor Anesthesia Care | Site: Eye | Laterality: Left | Wound class: Clean

## 2011-09-25 MED ORDER — MIDAZOLAM HCL 2 MG/2ML IJ SOLN
INTRAMUSCULAR | Status: AC
  Filled 2011-09-25: qty 2

## 2011-09-25 MED ORDER — LIDOCAINE HCL 3.5 % OP GEL
1.0000 "application " | Freq: Once | OPHTHALMIC | Status: AC
  Administered 2011-09-25: 1 via OPHTHALMIC

## 2011-09-25 MED ORDER — EPINEPHRINE HCL 1 MG/ML IJ SOLN
INTRAOCULAR | Status: DC | PRN
  Administered 2011-09-25: 12:00:00

## 2011-09-25 MED ORDER — CYCLOPENTOLATE-PHENYLEPHRINE 0.2-1 % OP SOLN
OPHTHALMIC | Status: AC
  Administered 2011-09-25: 1 [drp] via OPHTHALMIC
  Filled 2011-09-25: qty 2

## 2011-09-25 MED ORDER — LIDOCAINE HCL (PF) 1 % IJ SOLN
INTRAMUSCULAR | Status: AC
  Filled 2011-09-25: qty 2

## 2011-09-25 MED ORDER — POVIDONE-IODINE 5 % OP SOLN
OPHTHALMIC | Status: DC | PRN
  Administered 2011-09-25: 1 via OPHTHALMIC

## 2011-09-25 MED ORDER — LIDOCAINE HCL 3.5 % OP GEL
OPHTHALMIC | Status: AC
  Administered 2011-09-25: 1 via OPHTHALMIC
  Filled 2011-09-25: qty 5

## 2011-09-25 MED ORDER — CYCLOPENTOLATE-PHENYLEPHRINE 0.2-1 % OP SOLN
1.0000 [drp] | OPHTHALMIC | Status: AC
  Administered 2011-09-25 (×3): 1 [drp] via OPHTHALMIC

## 2011-09-25 MED ORDER — MIDAZOLAM HCL 2 MG/2ML IJ SOLN
1.0000 mg | INTRAMUSCULAR | Status: DC | PRN
  Administered 2011-09-25 (×2): 2 mg via INTRAVENOUS

## 2011-09-25 MED ORDER — TETRACAINE HCL 0.5 % OP SOLN
OPHTHALMIC | Status: AC
  Administered 2011-09-25: 1 [drp] via OPHTHALMIC
  Filled 2011-09-25: qty 2

## 2011-09-25 MED ORDER — BSS IO SOLN
INTRAOCULAR | Status: DC | PRN
  Administered 2011-09-25: 15 mL via OPHTHALMIC

## 2011-09-25 MED ORDER — FENTANYL CITRATE 0.05 MG/ML IJ SOLN: 25.0000 ug | INTRAMUSCULAR | Status: DC | PRN

## 2011-09-25 MED ORDER — NEOMYCIN-POLYMYXIN-DEXAMETH 0.1 % OP OINT
TOPICAL_OINTMENT | OPHTHALMIC | Status: DC | PRN
  Administered 2011-09-25: 1 via OPHTHALMIC

## 2011-09-25 MED ORDER — NEOMYCIN-POLYMYXIN-DEXAMETH 3.5-10000-0.1 OP OINT
TOPICAL_OINTMENT | OPHTHALMIC | Status: AC
  Filled 2011-09-25: qty 42

## 2011-09-25 MED ORDER — EPINEPHRINE HCL 1 MG/ML IJ SOLN
INTRAMUSCULAR | Status: AC
  Filled 2011-09-25: qty 1

## 2011-09-25 MED ORDER — PROVISC 10 MG/ML IO SOLN
INTRAOCULAR | Status: DC | PRN
  Administered 2011-09-25: 8.5 mg via OPHTHALMIC

## 2011-09-25 MED ORDER — LIDOCAINE HCL (PF) 1 % IJ SOLN
INTRAMUSCULAR | Status: DC | PRN
  Administered 2011-09-25: .5 mL

## 2011-09-25 MED ORDER — LACTATED RINGERS IV SOLN
INTRAVENOUS | Status: DC
  Administered 2011-09-25: 12:00:00 via INTRAVENOUS

## 2011-09-25 MED ORDER — LIDOCAINE 3.5 % OP GEL OPTIME - NO CHARGE
OPHTHALMIC | Status: DC | PRN
  Administered 2011-09-25: 1 [drp] via OPHTHALMIC

## 2011-09-25 MED ORDER — TETRACAINE HCL 0.5 % OP SOLN
1.0000 [drp] | OPHTHALMIC | Status: AC
  Administered 2011-09-25 (×3): 1 [drp] via OPHTHALMIC

## 2011-09-25 MED ORDER — PHENYLEPHRINE HCL 2.5 % OP SOLN
1.0000 [drp] | OPHTHALMIC | Status: AC
  Administered 2011-09-25 (×3): 1 [drp] via OPHTHALMIC

## 2011-09-25 MED ORDER — PHENYLEPHRINE HCL 2.5 % OP SOLN
OPHTHALMIC | Status: AC
  Administered 2011-09-25: 1 [drp] via OPHTHALMIC
  Filled 2011-09-25: qty 2

## 2011-09-25 MED ORDER — ONDANSETRON HCL 4 MG/2ML IJ SOLN: 4.0000 mg | Freq: Once | INTRAMUSCULAR | Status: DC | PRN

## 2011-09-25 MED ORDER — MIDAZOLAM HCL 2 MG/2ML IJ SOLN
INTRAMUSCULAR | Status: AC
  Administered 2011-09-25: 2 mg via INTRAVENOUS
  Filled 2011-09-25: qty 2

## 2011-09-25 SURGICAL SUPPLY — 34 items
CAPSULAR TENSION RING-AMO (OPHTHALMIC RELATED) IMPLANT
CLOTH BEACON ORANGE TIMEOUT ST (SAFETY) ×2 IMPLANT
DUOVISC SYSTEM (INTRAOCULAR LENS)
EYE SHIELD UNIVERSAL CLEAR (GAUZE/BANDAGES/DRESSINGS) ×2 IMPLANT
GLOVE BIO SURGEON STRL SZ 6.5 (GLOVE) IMPLANT
GLOVE BIOGEL PI IND STRL 6.5 (GLOVE) ×1 IMPLANT
GLOVE BIOGEL PI IND STRL 7.0 (GLOVE) ×1 IMPLANT
GLOVE BIOGEL PI IND STRL 7.5 (GLOVE) ×1 IMPLANT
GLOVE BIOGEL PI INDICATOR 6.5 (GLOVE) ×1
GLOVE BIOGEL PI INDICATOR 7.0 (GLOVE) ×1
GLOVE BIOGEL PI INDICATOR 7.5 (GLOVE) ×1
GLOVE ECLIPSE 6.5 STRL STRAW (GLOVE) IMPLANT
GLOVE ECLIPSE 7.0 STRL STRAW (GLOVE) IMPLANT
GLOVE ECLIPSE 7.5 STRL STRAW (GLOVE) IMPLANT
GLOVE EXAM NITRILE LRG STRL (GLOVE) ×2 IMPLANT
GLOVE EXAM NITRILE MD LF STRL (GLOVE) ×2 IMPLANT
GLOVE SKINSENSE NS SZ6.5 (GLOVE)
GLOVE SKINSENSE NS SZ7.0 (GLOVE)
GLOVE SKINSENSE STRL SZ6.5 (GLOVE) IMPLANT
GLOVE SKINSENSE STRL SZ7.0 (GLOVE) IMPLANT
KIT VITRECTOMY (OPHTHALMIC RELATED) IMPLANT
LENS INTRAOCULAR RESTOR ×2 IMPLANT
PAD ARMBOARD 7.5X6 YLW CONV (MISCELLANEOUS) ×2 IMPLANT
PROC W NO LENS (INTRAOCULAR LENS)
PROC W SPEC LENS (INTRAOCULAR LENS) ×2
PROCESS W NO LENS (INTRAOCULAR LENS) IMPLANT
PROCESS W SPEC LENS (INTRAOCULAR LENS) ×1 IMPLANT
RING MALYGIN (MISCELLANEOUS) IMPLANT
SYR TB 1ML LL NO SAFETY (SYRINGE) ×2 IMPLANT
SYSTEM DUOVISC (INTRAOCULAR LENS) IMPLANT
TAPE SURG TRANSPORE 1 IN (GAUZE/BANDAGES/DRESSINGS) ×1 IMPLANT
TAPE SURGICAL TRANSPORE 1 IN (GAUZE/BANDAGES/DRESSINGS) ×1
VISCOELASTIC ADDITIONAL (OPHTHALMIC RELATED) IMPLANT
WATER STERILE IRR 250ML POUR (IV SOLUTION) ×2 IMPLANT

## 2011-09-25 NOTE — Anesthesia Postprocedure Evaluation (Signed)
  Anesthesia Post-op Note  Patient: David [name]  Procedure(s) Performed:  CATARACT EXTRACTION PHACO AND INTRAOCULAR LENS PLACEMENT (IOC) - CDE:10.79  Patient Location: PACU and Short Stay  Anesthesia Type: MAC  Level of Consciousness: awake  Airway and Oxygen Therapy: Patient Spontanous Breathing  Post-op Pain: none  Post-op Assessment: Post-op Vital signs reviewed  Post-op Vital Signs: stable  Complications: No apparent anesthesia complications

## 2011-09-25 NOTE — Transfer of Care (Signed)
Immediate Anesthesia Transfer of Care Note  Patient: David [name]  Procedure(s) Performed:  CATARACT EXTRACTION PHACO AND INTRAOCULAR LENS PLACEMENT (IOC) - CDE:10.79   Patient Location: PACU and Short Stay  Anesthesia Type: MAC  Level of Consciousness: awake  Airway & Oxygen Therapy: Patient Spontanous Breathing  Post-op Assessment: Report given to PACU RN  Post vital signs: Reviewed and stable  Complications: No apparent anesthesia complications

## 2011-09-25 NOTE — Anesthesia Preprocedure Evaluation (Signed)
Anesthesia Evaluation  Patient identified by MRN, date of birth, ID band Patient awake  General Assessment Comment  Reviewed: Allergy & Precautions, H&P , NPO status , Patient's Chart, lab work & pertinent test results  History of Anesthesia Complications Negative for: history of anesthetic complications  Airway Mallampati: I      Dental  (+) Teeth Intact   Pulmonary COPDformer smoker   Pulmonary exam normal       Cardiovascular hypertension, Pt. on medications + CAD and + Cardiac Stents Regular Normal    Neuro/Psych    GI/Hepatic GERD Medicated and Controlled  Endo/Other  Diabetes mellitus-  Renal/GU      Musculoskeletal   Abdominal   Peds  Hematology   Anesthesia Other Findings   Reproductive/Obstetrics                           Anesthesia Physical Anesthesia Plan  ASA: III  Anesthesia Plan: MAC   Post-op Pain Management:    Induction:   Airway Management Planned: Nasal Cannula  Additional Equipment:   Intra-op Plan:   Post-operative Plan:   Informed Consent: I have reviewed the patients History and Physical, chart, labs and discussed the procedure including the risks, benefits and alternatives for the proposed anesthesia with the patient or authorized representative who has indicated his/her understanding and acceptance.     Plan Discussed with:   Anesthesia Plan Comments:         Anesthesia Quick Evaluation

## 2011-09-25 NOTE — Brief Op Note (Signed)
Pre-Op Dx: Cataract OS Post-Op Dx: Cataract OS Surgeon: Chayse Zatarain Anesthesia: Topical with MAC Implant: Alcon ReStor [id number] Specimen: None Complications: None 

## 2011-09-25 NOTE — H&P (Signed)
I have reviewed the H&P, the patient was re-examined, and I have identified no interval changes in medical condition and plan of care since the history and physical of record  

## 2011-09-26 NOTE — Op Note (Signed)
NAME:  David [name], David [name]             ACCOUNT NO.:  [id number]  MEDICAL RECORD NO.:  [id number]  LOCATION:                                FACILITY:  [facility]  PHYSICIAN:  Susanne Greenhouse, MD       DATE OF BIRTH:  Jun 24, 1941  DATE OF PROCEDURE:  09/25/2011 DATE OF DISCHARGE:                              OPERATIVE REPORT   PREOPERATIVE DIAGNOSES: 1. Combined cataract, left eye.  Diagnoses code 366.19. 2. Stigmatism, left eye.  Diagnoses code 367.21.  POSTOPERATIVE DIAGNOSES: 1. Combined cataract, left eye.  Diagnoses code 366.19. 2. Stigmatism, left eye.  Diagnoses code 367.21.  PROCEDURE PERFORMED:  Phacoemulsification with intraocular lens implantation, left eye.  SURGEON:  Susanne Greenhouse, MD  DESCRIPTION OF OPERATION:  In the preoperative holding area, dilating drops and viscous lidocaine were placed into the left eye.  The patient was then brought to the operating room where he was prepped and draped. Beginning with a 75-blade, a paracentesis port was made at the surgeon's 2 o'clock position.  The anterior chamber was then filled with a 1% nonpreserved lidocaine solution.  This was followed by filling the anterior chamber with Provisc.  The 2.4-mm keratome blade was then used to make a clear corneal incision at the temporal limbus.  A bent cystotome needle and Utrata forceps were used to create a continuous tear capsulotomy.  Hydrodissection was performed with balanced salt solution on a fine cannula.  The lens nucleus was then removed using phacoemulsification in a quadrant cracking technique.  Residual cortex was removed with irrigation and aspiration.  The capsular bag and anterior chamber were then refilled with Provisc and a posterior chamber intraocular lens was placed into the capsular bag without difficulty using a lens injecting system.  The Provisc was removed from the capsular bag and anterior chamber with irrigation and aspiration. Stromal hydration of the main incision  and paracentesis ports was performed with balanced salt solution on a fine cannula.  The wounds were tested for leak, which were negative.  The patient tolerated the procedure well.  There were no operative complications and he was returned to the recovery area in satisfactory condition.  A matching penetrating limbal incision was made at the opposite limbus.  The incisions were oriented on the 006 and 186-degree meridians.  There were no surgical specimens.  Prosthetic device used is a Alcon AcrySof posterior chamber lens, the model number is [id number], power of 17.5, serial number is [id number].          ______________________________ Susanne Greenhouse, MD     KEH/MEDQ  D:  09/25/2011  T:  09/26/2011  Job:  [id number]

## 2011-09-29 ENCOUNTER — Encounter (HOSPITAL_COMMUNITY): Payer: Self-pay | Admitting: Ophthalmology

## 2011-09-29 MED ORDER — CIPROFLOXACIN IN D5W 400 MG/200ML IV SOLN
INTRAVENOUS | Status: AC
  Filled 2011-09-29: qty 200

## 2011-10-04 ENCOUNTER — Encounter (HOSPITAL_COMMUNITY): Payer: Self-pay

## 2011-10-04 ENCOUNTER — Encounter (HOSPITAL_COMMUNITY)
Admission: RE | Admit: 2011-10-04 | Discharge: 2011-10-04 | Disposition: A | Discharge status: Home / Self Care | Payer: Medicare Other | Source: Ambulatory Visit | Attending: Ophthalmology | Admitting: Ophthalmology

## 2011-10-05 ENCOUNTER — Ambulatory Visit (HOSPITAL_COMMUNITY): Payer: Medicare Other | Admitting: Anesthesiology

## 2011-10-05 ENCOUNTER — Ambulatory Visit (HOSPITAL_COMMUNITY)
Admission: RE | Admit: 2011-10-05 | Discharge: 2011-10-05 | Disposition: A | Discharge status: Home / Self Care | Payer: Medicare Other | Source: Ambulatory Visit | Attending: Ophthalmology | Admitting: Ophthalmology

## 2011-10-05 ENCOUNTER — Encounter (HOSPITAL_COMMUNITY): Payer: Self-pay | Admitting: Anesthesiology

## 2011-10-05 ENCOUNTER — Encounter (HOSPITAL_COMMUNITY): Payer: Self-pay | Admitting: *Deleted

## 2011-10-05 ENCOUNTER — Encounter (HOSPITAL_COMMUNITY)
Admission: RE | Disposition: A | Discharge status: Home / Self Care | Payer: Self-pay | Source: Ambulatory Visit | Attending: Ophthalmology

## 2011-10-05 DIAGNOSIS — I10 Essential (primary) hypertension: Secondary | ICD-10-CM | POA: Insufficient documentation

## 2011-10-05 DIAGNOSIS — J449 Chronic obstructive pulmonary disease, unspecified: Secondary | ICD-10-CM | POA: Insufficient documentation

## 2011-10-05 DIAGNOSIS — E119 Type 2 diabetes mellitus without complications: Secondary | ICD-10-CM | POA: Insufficient documentation

## 2011-10-05 DIAGNOSIS — Z01812 Encounter for preprocedural laboratory examination: Secondary | ICD-10-CM | POA: Insufficient documentation

## 2011-10-05 DIAGNOSIS — Z79899 Other long term (current) drug therapy: Secondary | ICD-10-CM | POA: Insufficient documentation

## 2011-10-05 DIAGNOSIS — H2589 Other age-related cataract: Secondary | ICD-10-CM | POA: Insufficient documentation

## 2011-10-05 DIAGNOSIS — Z7982 Long term (current) use of aspirin: Secondary | ICD-10-CM | POA: Insufficient documentation

## 2011-10-05 DIAGNOSIS — J4489 Other specified chronic obstructive pulmonary disease: Secondary | ICD-10-CM | POA: Insufficient documentation

## 2011-10-05 HISTORY — PX: CATARACT EXTRACTION W/PHACO: SHX586

## 2011-10-05 SURGERY — PHACOEMULSIFICATION, CATARACT, WITH IOL INSERTION
Anesthesia: Monitor Anesthesia Care | Site: Eye | Laterality: Right | Wound class: Clean

## 2011-10-05 MED ORDER — NEOMYCIN-POLYMYXIN-DEXAMETH 3.5-10000-0.1 OP OINT
TOPICAL_OINTMENT | OPHTHALMIC | Status: AC
  Filled 2011-10-05: qty 3.5

## 2011-10-05 MED ORDER — EPINEPHRINE HCL 1 MG/ML IJ SOLN
INTRAOCULAR | Status: DC | PRN
  Administered 2011-10-05: 12:00:00

## 2011-10-05 MED ORDER — EPINEPHRINE HCL 1 MG/ML IJ SOLN
INTRAMUSCULAR | Status: AC
  Filled 2011-10-05: qty 1

## 2011-10-05 MED ORDER — TETRACAINE HCL 0.5 % OP SOLN
1.0000 [drp] | OPHTHALMIC | Status: AC
  Administered 2011-10-05 (×3): 1 [drp] via OPHTHALMIC

## 2011-10-05 MED ORDER — LIDOCAINE HCL 3.5 % OP GEL
OPHTHALMIC | Status: AC
  Filled 2011-10-05: qty 5

## 2011-10-05 MED ORDER — TETRACAINE HCL 0.5 % OP SOLN
OPHTHALMIC | Status: AC
  Administered 2011-10-05: 1 [drp] via OPHTHALMIC
  Filled 2011-10-05: qty 2

## 2011-10-05 MED ORDER — POVIDONE-IODINE 5 % OP SOLN
OPHTHALMIC | Status: DC | PRN
  Administered 2011-10-05: 1 via OPHTHALMIC

## 2011-10-05 MED ORDER — CYCLOPENTOLATE-PHENYLEPHRINE 0.2-1 % OP SOLN
1.0000 [drp] | OPHTHALMIC | Status: AC
  Administered 2011-10-05 (×3): 1 [drp] via OPHTHALMIC

## 2011-10-05 MED ORDER — LIDOCAINE HCL (PF) 1 % IJ SOLN
INTRAMUSCULAR | Status: AC
  Filled 2011-10-05: qty 2

## 2011-10-05 MED ORDER — LIDOCAINE HCL (PF) 1 % IJ SOLN
INTRAMUSCULAR | Status: DC | PRN
  Administered 2011-10-05: .4 mL

## 2011-10-05 MED ORDER — MIDAZOLAM HCL 2 MG/2ML IJ SOLN
INTRAMUSCULAR | Status: AC
  Administered 2011-10-05: 2 mg via INTRAVENOUS
  Filled 2011-10-05: qty 2

## 2011-10-05 MED ORDER — PHENYLEPHRINE HCL 2.5 % OP SOLN
OPHTHALMIC | Status: AC
  Administered 2011-10-05: 1 [drp] via OPHTHALMIC
  Filled 2011-10-05: qty 2

## 2011-10-05 MED ORDER — BSS IO SOLN
INTRAOCULAR | Status: DC | PRN
  Administered 2011-10-05: 15 mL via INTRAOCULAR

## 2011-10-05 MED ORDER — LIDOCAINE HCL 3.5 % OP GEL: 1.0000 "application " | Freq: Once | OPHTHALMIC | Status: DC

## 2011-10-05 MED ORDER — MIDAZOLAM HCL 2 MG/2ML IJ SOLN
1.0000 mg | INTRAMUSCULAR | Status: DC | PRN
  Administered 2011-10-05: 2 mg via INTRAVENOUS

## 2011-10-05 MED ORDER — LACTATED RINGERS IV SOLN
INTRAVENOUS | Status: DC
  Administered 2011-10-05: 11:00:00 via INTRAVENOUS

## 2011-10-05 MED ORDER — PROVISC 10 MG/ML IO SOLN
INTRAOCULAR | Status: DC | PRN
  Administered 2011-10-05: 8.5 mg via INTRAOCULAR

## 2011-10-05 MED ORDER — LIDOCAINE 3.5 % OP GEL OPTIME - NO CHARGE
OPHTHALMIC | Status: DC | PRN
  Administered 2011-10-05: 2 [drp] via OPHTHALMIC

## 2011-10-05 MED ORDER — NEOMYCIN-POLYMYXIN-DEXAMETH 0.1 % OP OINT
TOPICAL_OINTMENT | OPHTHALMIC | Status: DC | PRN
  Administered 2011-10-05: 1 via OPHTHALMIC

## 2011-10-05 MED ORDER — PHENYLEPHRINE HCL 2.5 % OP SOLN
1.0000 [drp] | OPHTHALMIC | Status: AC
  Administered 2011-10-05 (×3): 1 [drp] via OPHTHALMIC

## 2011-10-05 SURGICAL SUPPLY — 34 items
CAPSULAR TENSION RING-AMO (OPHTHALMIC RELATED) IMPLANT
CLOTH BEACON ORANGE TIMEOUT ST (SAFETY) ×2 IMPLANT
DUOVISC SYSTEM (INTRAOCULAR LENS)
EYE SHIELD UNIVERSAL CLEAR (GAUZE/BANDAGES/DRESSINGS) ×2 IMPLANT
GLOVE BIO SURGEON STRL SZ 6.5 (GLOVE) IMPLANT
GLOVE BIOGEL PI IND STRL 6.5 (GLOVE) ×1 IMPLANT
GLOVE BIOGEL PI IND STRL 7.0 (GLOVE) IMPLANT
GLOVE BIOGEL PI IND STRL 7.5 (GLOVE) IMPLANT
GLOVE BIOGEL PI INDICATOR 6.5 (GLOVE) ×1
GLOVE BIOGEL PI INDICATOR 7.0 (GLOVE)
GLOVE BIOGEL PI INDICATOR 7.5 (GLOVE)
GLOVE ECLIPSE 6.5 STRL STRAW (GLOVE) IMPLANT
GLOVE ECLIPSE 7.0 STRL STRAW (GLOVE) IMPLANT
GLOVE ECLIPSE 7.5 STRL STRAW (GLOVE) IMPLANT
GLOVE EXAM NITRILE LRG STRL (GLOVE) IMPLANT
GLOVE EXAM NITRILE MD LF STRL (GLOVE) ×2 IMPLANT
GLOVE SKINSENSE NS SZ6.5 (GLOVE)
GLOVE SKINSENSE NS SZ7.0 (GLOVE)
GLOVE SKINSENSE STRL SZ6.5 (GLOVE) IMPLANT
GLOVE SKINSENSE STRL SZ7.0 (GLOVE) IMPLANT
KIT VITRECTOMY (OPHTHALMIC RELATED) IMPLANT
LENS INTRAOCULAR RESTOR ×2 IMPLANT
PAD ARMBOARD 7.5X6 YLW CONV (MISCELLANEOUS) ×2 IMPLANT
PROC W NO LENS (INTRAOCULAR LENS)
PROC W SPEC LENS (INTRAOCULAR LENS) ×2
PROCESS W NO LENS (INTRAOCULAR LENS) IMPLANT
PROCESS W SPEC LENS (INTRAOCULAR LENS) ×1 IMPLANT
RING MALYGIN (MISCELLANEOUS) IMPLANT
SYR TB 1ML LL NO SAFETY (SYRINGE) ×2 IMPLANT
SYSTEM DUOVISC (INTRAOCULAR LENS) IMPLANT
TAPE SURG TRANSPORE 1 IN (GAUZE/BANDAGES/DRESSINGS) ×1 IMPLANT
TAPE SURGICAL TRANSPORE 1 IN (GAUZE/BANDAGES/DRESSINGS) ×1
VISCOELASTIC ADDITIONAL (OPHTHALMIC RELATED) IMPLANT
WATER STERILE IRR 250ML POUR (IV SOLUTION) ×2 IMPLANT

## 2011-10-05 NOTE — Brief Op Note (Signed)
Pre-Op Dx: Cataract OD Post-Op Dx: Cataract OD Surgeon: Gemma Payor Anesthesia: Topical with MAC Implant: Alcon ReStor, [id number] Blood Loss: None Specimen: None Complications: None

## 2011-10-05 NOTE — Anesthesia Preprocedure Evaluation (Signed)
Anesthesia Evaluation  Patient identified by MRN, date of birth, ID band Patient awake  General Assessment Comment  Reviewed: Allergy & Precautions, H&P , NPO status , Patient's Chart, lab work & pertinent test results  History of Anesthesia Complications Negative for: history of anesthetic complications  Airway Mallampati: I      Dental  (+) Teeth Intact   Pulmonary COPDformer smoker   Pulmonary exam normal       Cardiovascular hypertension, Pt. on medications + CAD and + Cardiac Stents Regular Normal    Neuro/Psych    GI/Hepatic GERD Medicated and Controlled  Endo/Other  Diabetes mellitus-  Renal/GU      Musculoskeletal   Abdominal   Peds  Hematology   Anesthesia Other Findings   Reproductive/Obstetrics                           Anesthesia Physical Anesthesia Plan  ASA: III  Anesthesia Plan: MAC   Post-op Pain Management:    Induction:   Airway Management Planned: Nasal Cannula  Additional Equipment:   Intra-op Plan:   Post-operative Plan:   Informed Consent: I have reviewed the patients History and Physical, chart, labs and discussed the procedure including the risks, benefits and alternatives for the proposed anesthesia with the patient or authorized representative who has indicated his/her understanding and acceptance.     Plan Discussed with:   Anesthesia Plan Comments:         Anesthesia Quick Evaluation  

## 2011-10-05 NOTE — Anesthesia Postprocedure Evaluation (Signed)
  Anesthesia Post-op Note  Patient: David [name]  Procedure(s) Performed:  CATARACT EXTRACTION PHACO AND INTRAOCULAR LENS PLACEMENT (IOC) - CDE: 8.45  Patient Location: PACU and Short Stay  Anesthesia Type: MAC  Level of Consciousness: awake, alert , oriented and patient cooperative  Airway and Oxygen Therapy: Patient Spontanous Breathing  Post-op Pain: none  Post-op Assessment: Post-op Vital signs reviewed, Patient's Cardiovascular Status Stable, Respiratory Function Stable, Patent Airway and No signs of Nausea or vomiting  Post-op Vital Signs: Reviewed and stable  Complications: No apparent anesthesia complications

## 2011-10-05 NOTE — Transfer of Care (Signed)
Immediate Anesthesia Transfer of Care Note  Patient: David [name]  Procedure(s) Performed:  CATARACT EXTRACTION PHACO AND INTRAOCULAR LENS PLACEMENT (IOC) - CDE: 8.45  Patient Location: PACU and Short Stay  Anesthesia Type: MAC  Level of Consciousness: awake, alert , oriented and patient cooperative  Airway & Oxygen Therapy: Patient Spontanous Breathing  Post-op Assessment: Report given to PACU RN, Post -op Vital signs reviewed and stable and Patient moving all extremities X 4  Post vital signs: Reviewed and stable  Complications: No apparent anesthesia complications

## 2011-10-05 NOTE — H&P (Signed)
I have reviewed the H&P, the patient was re-examined, and I have identified no interval changes in medical condition and plan of care since the history and physical of record  

## 2011-10-06 NOTE — Op Note (Signed)
NAME:  Pistilli, Stirling             ACCOUNT NO.:  [id number]  MEDICAL RECORD NO.:  [id number]  LOCATION:  [facility]                          FACILITY:  [facility]  PHYSICIAN:  Susanne Greenhouse, MD       DATE OF BIRTH:  11-26-1941  DATE OF PROCEDURE:  10/05/2011 DATE OF DISCHARGE:  10/05/2011                              OPERATIVE REPORT   PREOPERATIVE DIAGNOSIS:  Combined cataract, right eye, diagnosis code 366.19.  POSTOPERATIVE DIAGNOSIS:  Combined cataract, right eye, diagnosis code 366.19.  OPERATION PERFORMED:  Phacoemulsification with posterior chamber intraocular lens implantation, right eye.  SURGEON:  Bonne Dolores. Akeia Perot, MD  ANESTHESIA:  General endotracheal anesthesia.  OPERATIVE SUMMARY:  In the preoperative area, dilating drops were placed into the right eye.  The patient was then brought into the operating room where he was placed under general anesthesia.  The eye was then prepped and draped.  Beginning with a 75 blade, a paracentesis port was made at the surgeon's 2 o'clock position.  The anterior chamber was then filled with a 1% nonpreserved lidocaine solution with epinephrine.  This was followed by Viscoat to deepen the chamber.  A small fornix-based peritomy was performed superiorly.  Next, a single iris hook was placed through the limbus superiorly.  A 2.4-mm keratome blade was then used to make a clear corneal incision over the iris hook.  A bent cystotome needle and Utrata forceps were used to create a continuous tear capsulotomy. Hydrodissection was performed using balanced salt solution on a fine cannula.  The lens nucleus was then removed using phacoemulsification in a quadrant cracking technique.  The cortical material was then removed with irrigation and aspiration.  The capsular bag and anterior chamber were refilled with Provisc.  The wound was widened to approximately 3 mm and a posterior chamber intraocular lens was placed into the capsular bag without difficulty  using an [employer] lens injecting system.  A single 10-0 nylon suture was then used to close the incision as well as stromal hydration.  The Provisc was removed from the anterior chamber and capsular bag with irrigation and aspiration.  At this point, the wounds were tested for leak, which were negative.  The anterior chamber remained deep and stable.  The patient tolerated the procedure well.  There were no operative complications, and he awoke from general anesthesia without problem.  No surgical specimens.  PROSTHETIC DEVICE USED:  An Alcon AcrySof ReSTOR posterior chamber lens model [id number], power of 17.5, serial number is [id number].          ______________________________ Susanne Greenhouse, MD     KEH/MEDQ  D:  10/05/2011  T:  10/06/2011  Job:  [id number]

## 2011-10-11 ENCOUNTER — Encounter (HOSPITAL_COMMUNITY): Payer: Self-pay | Admitting: Ophthalmology

## 2011-10-30 ENCOUNTER — Other Ambulatory Visit: Payer: Self-pay | Admitting: Cardiovascular Disease

## 2012-05-24 ENCOUNTER — Other Ambulatory Visit (HOSPITAL_COMMUNITY): Payer: Self-pay | Admitting: Family Medicine

## 2012-05-24 ENCOUNTER — Ambulatory Visit (HOSPITAL_COMMUNITY)
Admission: RE | Admit: 2012-05-24 | Discharge: 2012-05-24 | Disposition: A | Discharge status: Home / Self Care | Payer: Medicare Other | Source: Ambulatory Visit | Attending: Family Medicine | Admitting: Family Medicine

## 2012-05-24 DIAGNOSIS — E119 Type 2 diabetes mellitus without complications: Secondary | ICD-10-CM | POA: Insufficient documentation

## 2012-05-24 DIAGNOSIS — I10 Essential (primary) hypertension: Secondary | ICD-10-CM | POA: Insufficient documentation

## 2012-05-24 DIAGNOSIS — I2510 Atherosclerotic heart disease of native coronary artery without angina pectoris: Secondary | ICD-10-CM | POA: Insufficient documentation

## 2012-05-24 DIAGNOSIS — J449 Chronic obstructive pulmonary disease, unspecified: Secondary | ICD-10-CM | POA: Insufficient documentation

## 2012-05-24 DIAGNOSIS — J4489 Other specified chronic obstructive pulmonary disease: Secondary | ICD-10-CM | POA: Insufficient documentation

## 2012-05-24 DIAGNOSIS — Z951 Presence of aortocoronary bypass graft: Secondary | ICD-10-CM | POA: Insufficient documentation

## 2012-06-03 ENCOUNTER — Other Ambulatory Visit: Payer: Self-pay | Admitting: Cardiovascular Disease

## 2012-08-12 ENCOUNTER — Encounter: Payer: Self-pay | Admitting: Cardiovascular Disease

## 2012-08-12 ENCOUNTER — Ambulatory Visit (INDEPENDENT_AMBULATORY_CARE_PROVIDER_SITE_OTHER): Payer: Medicare Other | Admitting: Cardiovascular Disease

## 2012-08-12 VITALS — BP 136/80 | HR 73 | Ht 69.0 in | Wt 215.4 lb

## 2012-08-12 DIAGNOSIS — I10 Essential (primary) hypertension: Secondary | ICD-10-CM

## 2012-08-12 DIAGNOSIS — E785 Hyperlipidemia, unspecified: Secondary | ICD-10-CM

## 2012-08-12 DIAGNOSIS — I25810 Atherosclerosis of coronary artery bypass graft(s) without angina pectoris: Secondary | ICD-10-CM

## 2012-08-12 NOTE — Assessment & Plan Note (Signed)
Blood pressure is well controlled on a combination of Exforge and long-acting metoprolol.

## 2012-08-12 NOTE — Assessment & Plan Note (Signed)
The patient is stable without anginal symptoms. He is status post surgical revascularization with the mammary artery to LAD graft. This was shown to be patent at cardiac catheterization last year. He will continue on his current medical program and I would like to see him back in 12 months.

## 2012-08-12 NOTE — Progress Notes (Signed)
   HPI:  71 year old gentleman presenting for followup of coronary artery disease. The patient has prior coronary bypass surgery with the LIMA to LAD. He had a cardiac catheterization in 2012 demonstrating continued patency of his LIMA to LAD with nonobstructive disease in the left circumflex and right coronary arteries. His left ventricular function was noted to be normal.  The patient is doing well at present. He exercises on a stationary bike without exertional chest pain or pressure. He admits to mild dyspnea with exertion, unchanged over time. He's had no palpitations, orthopnea, or PND. He has mild leg swelling which is unchanged over time. He's had surgery on his fingers as well as bilateral carpal tunnel surgery since his last evaluation here.  Outpatient Encounter Prescriptions as of 08/12/2012  Medication Sig Dispense Refill  . acetaminophen (TYLENOL) 500 MG tablet Take 500-1,000 mg by mouth every 6 (six) hours as needed. For pain      . aspirin 81 MG tablet Take 81 mg by mouth daily.        Marland Kitchen EXFORGE 5-160 MG per tablet TAKE ONE TABLET BY MOUTH ONCE DAILY.  30 each  5  . hydroxychloroquine (PLAQUENIL) 200 MG tablet Take 200 mg by mouth 2 (two) times daily.        . metFORMIN (GLUCOPHAGE) 500 MG tablet Take 500 mg by mouth 2 (two) times daily with a meal.        . omeprazole (PRILOSEC) 20 MG capsule Take 20 mg by mouth every morning.       Marland Kitchen PRAVACHOL 20 MG tablet TAKE ONE TABLET DAILY.  30 each  5  . sitaGLIPtin (JANUVIA) 100 MG tablet Take 100 mg by mouth every morning.       Marland Kitchen tetrahydrozoline-zinc (VISINE-AC) 0.05-0.25 % ophthalmic solution Place 2 drops into both eyes 3 (three) times daily as needed. Irritated Eyes       . TOPROL XL 25 MG 24 hr tablet TAKE 1 TABLET BY MOUTH ONCE A DAY.  30 each  12    No Known Allergies  Past Medical History  Diagnosis Date  . Coronary artery disease     s/p PCI 2000 and single vessel CABG 2001  . Hypertension   . Hypercholesterolemia   . GERD  (gastroesophageal reflux disease)   . Tubular adenoma 2003  . COPD (chronic obstructive pulmonary disease)   . Diabetes mellitus   . Arthritis     ROS: Negative except as per HPI  BP 136/80  Pulse 73  Ht 5\' 9"  (1.753 m)  Wt 97.705 kg (215 lb 6.4 oz)  BMI 31.81 kg/m2  PHYSICAL EXAM: Pt is alert and oriented, NAD HEENT: normal Neck: JVP - normal, carotids 2+= without bruits Lungs: CTA bilaterally CV: RRR without murmur or gallop Abd: soft, NT, Positive BS, no hepatomegaly Ext: no C/C/E, distal pulses intact and equal Skin: warm/dry no rash  EKG:  Normal sinus rhythm 73 beats per minute, nonspecific ST abnormality.  ASSESSMENT AND PLAN:

## 2012-08-12 NOTE — Assessment & Plan Note (Signed)
The patient is on low-dose pravastatin. His lipids are followed by his primary care physician.

## 2012-08-12 NOTE — Patient Instructions (Signed)
Your physician wants you to follow-up in: 1 YEAR with Dr Cooper.  You will receive a reminder letter in the mail two months in advance. If you don't receive a letter, please call our office to schedule the follow-up appointment.  Your physician recommends that you continue on your current medications as directed. Please refer to the Current Medication list given to you today.  

## 2012-11-04 ENCOUNTER — Other Ambulatory Visit: Payer: Self-pay | Admitting: Cardiovascular Disease

## 2012-12-06 ENCOUNTER — Other Ambulatory Visit: Payer: Self-pay | Admitting: Cardiology

## 2012-12-06 ENCOUNTER — Telehealth: Payer: Self-pay | Admitting: Cardiovascular Disease

## 2012-12-06 MED ORDER — AMLODIPINE BESYLATE-VALSARTAN 5-160 MG PO TABS: 1.0000 | ORAL_TABLET | Freq: Every day | ORAL | Status: DC

## 2012-12-06 MED ORDER — PRAVASTATIN SODIUM 20 MG PO TABS: 20.0000 mg | ORAL_TABLET | Freq: Every day | ORAL | Status: DC

## 2012-12-06 NOTE — Telephone Encounter (Signed)
New Problem:     Patient called in needing a refill of his EXFORGE 5-160 MG per tablet and pravastatin.  Patient is leaving town tomorrow morning.  Please call back. If you have any questions.

## 2012-12-06 NOTE — Telephone Encounter (Signed)
Prescription refills were sent electronically earlier today. I called [facility] and confirmed they have received refills.

## 2013-05-05 ENCOUNTER — Other Ambulatory Visit: Payer: Self-pay | Admitting: Cardiology

## 2013-05-06 ENCOUNTER — Other Ambulatory Visit: Payer: Self-pay

## 2013-05-06 MED ORDER — AMLODIPINE BESYLATE-VALSARTAN 5-160 MG PO TABS: 1.0000 | ORAL_TABLET | Freq: Every day | ORAL | Status: DC

## 2013-05-06 NOTE — Telephone Encounter (Signed)
EXFORGE 5-160 MG per tablet  TAKE ONE TABLET BY MOUTH ONCE DAILY.   30 each   PRAVACHOL 20 MG tablet  TAKE ONE TABLET DAILY.   30 each   HYPERTENSION, BENIGN - Tonny Bollman, MD at 08/12/2012 10:07 AM  Status: Written Related Problem: HYPERTENSION, BENIGN  Blood pressure is well controlled on a combination of Exforge and long-acting metoprolol.  HYPERLIPIDEMIA-MIXED Tonny Bollman, MD at 08/12/2012 10:07 AM  Status: Written Related Problem: HYPERLIPIDEMIA-MIXED  The patient is on low-dose pravastatin. His lipids are followed by his primary care physician.

## 2013-06-02 ENCOUNTER — Other Ambulatory Visit: Payer: Self-pay | Admitting: Cardiology

## 2013-06-02 NOTE — Telephone Encounter (Signed)
Called and left generic voicemail for pt to call back to clarify his rx for pravastatin.

## 2013-06-03 ENCOUNTER — Other Ambulatory Visit: Payer: Self-pay | Admitting: Cardiology

## 2013-08-08 ENCOUNTER — Encounter: Payer: Self-pay | Admitting: Cardiovascular Disease

## 2013-08-08 ENCOUNTER — Ambulatory Visit (INDEPENDENT_AMBULATORY_CARE_PROVIDER_SITE_OTHER): Payer: Medicare Other | Admitting: Cardiovascular Disease

## 2013-08-08 VITALS — BP 138/80 | HR 79 | Ht 69.0 in | Wt 213.0 lb

## 2013-08-08 DIAGNOSIS — I10 Essential (primary) hypertension: Secondary | ICD-10-CM

## 2013-08-08 DIAGNOSIS — E785 Hyperlipidemia, unspecified: Secondary | ICD-10-CM

## 2013-08-08 DIAGNOSIS — I25810 Atherosclerosis of coronary artery bypass graft(s) without angina pectoris: Secondary | ICD-10-CM

## 2013-08-08 NOTE — Patient Instructions (Signed)
Your physician wants you to follow-up in: 1 YEAR with Dr Cooper.  You will receive a reminder letter in the mail two months in advance. If you don't receive a letter, please call our office to schedule the follow-up appointment.  Your physician recommends that you continue on your current medications as directed. Please refer to the Current Medication list given to you today.  

## 2013-08-08 NOTE — Progress Notes (Signed)
HPI:   72 year old gentleman presenting for followup of coronary artery disease. The patient has prior coronary bypass surgery with the LIMA to LAD. He had a cardiac catheterization in 2012 demonstrating continued patency of his LIMA to LAD with nonobstructive disease in the left circumflex and right coronary arteries. His left ventricular function was noted to be normal.  The patient is having some problems with shortness of breath. He complains of wheezing at night, post-nasal drip, and exertional dyspnea. Denies chest pain or pressure. Denies orthopnea or PND. Mild leg swelling is unchanged. No other complaints. Still goes to work 5 days/week. Tries to ride stationary bike 3 days per week. Has knee pain/arthritis.  Outpatient Encounter Prescriptions as of 08/08/2013  Medication Sig Dispense Refill  . acetaminophen (TYLENOL) 500 MG tablet Take 500-1,000 mg by mouth every 6 (six) hours as needed. For pain      . amLODipine-valsartan (EXFORGE) 5-160 MG per tablet Take 1 tablet by mouth daily.  30 tablet  2  . aspirin 81 MG tablet Take 81 mg by mouth daily.        . hydroxychloroquine (PLAQUENIL) 200 MG tablet Take 200 mg by mouth 2 (two) times daily.        . metFORMIN (GLUCOPHAGE) 500 MG tablet Take 500 mg by mouth 2 (two) times daily with a meal.        . omeprazole (PRILOSEC) 20 MG capsule Take 20 mg by mouth every morning.       . pravastatin (PRAVACHOL) 20 MG tablet TAKE ONE TABLET DAILY.  30 tablet  3  . sitaGLIPtin (JANUVIA) 100 MG tablet Take 100 mg by mouth every morning.       Marland Kitchen tetrahydrozoline-zinc (VISINE-AC) 0.05-0.25 % ophthalmic solution Place 2 drops into both eyes 3 (three) times daily as needed. Irritated Eyes       . TOPROL XL 25 MG 24 hr tablet TAKE 1 TABLET BY MOUTH ONCE A DAY.  30 tablet  12   No facility-administered encounter medications on file as of 08/08/2013.    No Known Allergies  Past Medical History  Diagnosis Date  . Coronary artery disease     s/p PCI 2000  and single vessel CABG 2001  . Hypertension   . Hypercholesterolemia   . GERD (gastroesophageal reflux disease)   . Tubular adenoma 2003  . COPD (chronic obstructive pulmonary disease)   . Diabetes mellitus   . Arthritis    ROS: Negative except as per HPI  BP 138/80  Pulse 79  Ht 5\' 9"  (1.753 m)  Wt 213 lb (96.616 kg)  BMI 31.44 kg/m2  PHYSICAL EXAM: Pt is alert and oriented, NAD HEENT: normal Neck: JVP - normal, carotids 2+= without bruits Lungs: mild end-expiratory wheezing CV: RRR without murmur or gallop Abd: soft, NT, Positive BS, no hepatomegaly Ext: no C/C/E, distal pulses intact and equal Skin: warm/dry no rash  EKG:  Sinus rhythm, within normal limits. HR 79 bpm.  ASSESSMENT AND PLAN: 1. CAD, s/p CABG. No anginal symptoms. Meds reviewed and appear appropriate. Continue ASA, ARB, statin drug. Needs to exercise more regularly and make dietary changes aimed at weight loss. We discussed this. He will follow-up in one year.  2. HTN - BP controlled. Lifestyle modification reviewed.  3. Hyperlipidemia - followed by Dr Regino Schultze. On low-dose prastatin.  4. Shortness of breath. Doubt cardiac-related. Seems like upper respiratory issue, post-nasal drip, etc. He is going to see Dr Regino Schultze next week.  Tonny Bollman 08/08/2013 9:29  AM

## 2013-08-14 ENCOUNTER — Other Ambulatory Visit (HOSPITAL_COMMUNITY): Payer: Self-pay | Admitting: Family Medicine

## 2013-08-14 ENCOUNTER — Ambulatory Visit (HOSPITAL_COMMUNITY)
Admission: RE | Admit: 2013-08-14 | Discharge: 2013-08-14 | Disposition: A | Discharge status: Home / Self Care | Payer: Medicare Other | Source: Ambulatory Visit | Attending: Family Medicine | Admitting: Family Medicine

## 2013-08-14 DIAGNOSIS — Z951 Presence of aortocoronary bypass graft: Secondary | ICD-10-CM | POA: Insufficient documentation

## 2013-08-14 DIAGNOSIS — I10 Essential (primary) hypertension: Secondary | ICD-10-CM | POA: Insufficient documentation

## 2013-08-14 DIAGNOSIS — E119 Type 2 diabetes mellitus without complications: Secondary | ICD-10-CM | POA: Insufficient documentation

## 2013-08-14 DIAGNOSIS — J4489 Other specified chronic obstructive pulmonary disease: Secondary | ICD-10-CM | POA: Insufficient documentation

## 2013-08-14 DIAGNOSIS — J449 Chronic obstructive pulmonary disease, unspecified: Secondary | ICD-10-CM

## 2013-08-14 DIAGNOSIS — R05 Cough: Secondary | ICD-10-CM | POA: Insufficient documentation

## 2013-08-14 DIAGNOSIS — R059 Cough, unspecified: Secondary | ICD-10-CM | POA: Insufficient documentation

## 2013-08-18 ENCOUNTER — Other Ambulatory Visit (HOSPITAL_COMMUNITY): Payer: Self-pay | Admitting: Family Medicine

## 2013-08-18 DIAGNOSIS — R9389 Abnormal findings on diagnostic imaging of other specified body structures: Secondary | ICD-10-CM

## 2013-08-19 ENCOUNTER — Ambulatory Visit (HOSPITAL_COMMUNITY)
Admission: RE | Admit: 2013-08-19 | Discharge: 2013-08-19 | Disposition: A | Discharge status: Home / Self Care | Payer: Medicare Other | Source: Ambulatory Visit | Attending: Family Medicine | Admitting: Family Medicine

## 2013-08-19 DIAGNOSIS — R918 Other nonspecific abnormal finding of lung field: Secondary | ICD-10-CM | POA: Insufficient documentation

## 2013-08-19 DIAGNOSIS — R05 Cough: Secondary | ICD-10-CM | POA: Insufficient documentation

## 2013-08-19 DIAGNOSIS — R222 Localized swelling, mass and lump, trunk: Secondary | ICD-10-CM | POA: Insufficient documentation

## 2013-08-19 DIAGNOSIS — R059 Cough, unspecified: Secondary | ICD-10-CM | POA: Insufficient documentation

## 2013-08-19 DIAGNOSIS — R9389 Abnormal findings on diagnostic imaging of other specified body structures: Secondary | ICD-10-CM

## 2013-08-19 MED ORDER — IOHEXOL 300 MG/ML  SOLN
100.0000 mL | Freq: Once | INTRAMUSCULAR | Status: AC | PRN
  Administered 2013-08-19: 80 mL via INTRAVENOUS

## 2013-08-21 ENCOUNTER — Other Ambulatory Visit: Payer: Self-pay | Admitting: *Deleted

## 2013-08-21 ENCOUNTER — Ambulatory Visit (INDEPENDENT_AMBULATORY_CARE_PROVIDER_SITE_OTHER): Payer: Medicare Other | Admitting: Cardiothoracic Surgery

## 2013-08-21 VITALS — BP 143/84 | HR 87 | Resp 20 | Ht 69.0 in | Wt 208.0 lb

## 2013-08-21 DIAGNOSIS — R918 Other nonspecific abnormal finding of lung field: Secondary | ICD-10-CM

## 2013-08-21 DIAGNOSIS — R222 Localized swelling, mass and lump, trunk: Secondary | ICD-10-CM

## 2013-08-22 ENCOUNTER — Other Ambulatory Visit: Payer: Self-pay | Admitting: *Deleted

## 2013-08-22 DIAGNOSIS — R918 Other nonspecific abnormal finding of lung field: Secondary | ICD-10-CM

## 2013-08-22 NOTE — Progress Notes (Signed)
[address]       [place] [address]             [phone]                    YI HAUGAN [facility] Medical Record #[id number] Date of Birth: [birth year]  Referring: Karleen Hampshire, MD Primary Care: [name], MD  Chief Complaint:    Chief Complaint  Patient presents with  . Lung Mass    Surgical eval on right hilar lung mass, Chest CT 08/19/13     History of Present Illness:    Patient is a 72 year old male who presents with approximately three-week history of some increasing shortness of breath with exertion cough and episodic wheezing. He denies fevers or chills. He had a routine visit with his primary care doctor who obtained a chest x-ray and subsequently a CT scan of his demonstrated a large right hilar mass. The patient is referred to thoracic surgery for further evaluation and treatment.  He previously had coronary artery bypass grafting in 2002, a followup cardiac catheterization was done in 2012, a chest x-ray at that time showed no evidence of hilar mass.      Current Activity/ Functional Status:  Patient is independent with mobility/ambulation, transfers, ADL's, IADL's.  Zubrod Score: At the time of surgery this patient's most appropriate activity status/level should be described as: []  Normal activity, no symptoms [x]  Symptoms, fully ambulatory []  Symptoms, in bed less than or equal to 50% of the time []  Symptoms, in bed greater than 50% of the time but less than 100% []  Bedridden []  Moribund   Past Medical History  Diagnosis Date  . Coronary artery disease     s/p PCI 2000 and single vessel CABG 2001  . Hypertension   . Hypercholesterolemia   . GERD (gastroesophageal reflux disease)   . Tubular adenoma 2003  . COPD (chronic obstructive pulmonary disease)   . Diabetes mellitus   . Arthritis   . Allergic rhinitis     Past Surgical History  Procedure Laterality Date  . Left rotator cuff repair    . Left  knee arthroscopy   x2    for meniscal tear  . Coronary stent placement  2001  . Right knee arthroscopy    . Bilateral carpal tunnel release    . Right shoulder arthroscopy    . Coronary artery bypass graft      occlusion of the stent and underwent one-vessel CABG in 2002  . Coronary angioplasty    . Colonoscopy  09/13/2011    Procedure: COLONOSCOPY;  Surgeon: Malissa Hippo, MD;  Location: [facility];  Service: Endoscopy;  Laterality: N/A;  . Cataract extraction w/phaco  09/25/2011    Procedure: CATARACT EXTRACTION PHACO AND INTRAOCULAR LENS PLACEMENT (IOC);  Surgeon: Gemma Payor;  Location: [facility] ORS;  Service: Ophthalmology;  Laterality: Left;  CDE:10.79  . Cataract extraction w/phaco  10/05/2011    Procedure: CATARACT EXTRACTION PHACO AND INTRAOCULAR LENS PLACEMENT (IOC);  Surgeon: Gemma Payor;  Location: [facility];  Service: Ophthalmology;  Laterality: Right;  CDE: 8.45    Family History  Problem Relation Age of Onset  . Heart failure Mother   . Cancer Father     lung canger  . Cancer Brother     lung cancer  . Anesthesia problems Neg Hx   . Hypotension Neg Hx   . Malignant hyperthermia Neg Hx   . Pseudochol deficiency  Neg Hx       History  Smoking status  . Former Smoker -- 1.50 packs/day for 40 years  . Types: Cigarettes  . Quit date: 09/19/1995  Smokeless tobacco  . Not on file    History  Alcohol Use  . 7.0 oz/week  . 14 drink(s) per week    Comment: scotch 1 shot daily     No Known Allergies  Current Outpatient Prescriptions  Medication Sig Dispense Refill  . amLODipine-valsartan (EXFORGE) 5-160 MG per tablet Take 1 tablet by mouth daily.  30 tablet  2  . hydroxychloroquine (PLAQUENIL) 200 MG tablet Take 200 mg by mouth 2 (two) times daily.        . metFORMIN (GLUCOPHAGE) 500 MG tablet Take 500 mg by mouth 2 (two) times daily with a meal.        . omeprazole (PRILOSEC) 20 MG capsule Take 20 mg by mouth every morning.       . pravastatin (PRAVACHOL) 20 MG  tablet TAKE ONE TABLET DAILY.  30 tablet  3  . sitaGLIPtin (JANUVIA) 100 MG tablet Take 100 mg by mouth every morning.       . TOPROL XL 25 MG 24 hr tablet TAKE 1 TABLET BY MOUTH ONCE A DAY.  30 tablet  12   No current facility-administered medications for this visit.       Review of Systems:     Cardiac Review of Systems: Y or N  Chest Pain [ n   ]  Resting SOB [  n ] Exertional SOB  [ y ]  Orthopnea [ n ]   Pedal Edema [ y  ]    Palpitations [n  ] Syncope  [n  ]   Presyncope [ n  ]  General Review of Systems: [Y] = yes [  ]=no Constitional: recent weight change [n  ]; anorexia [  ]; fatigue [  ]; nausea [  ]; night sweats [ n ]; fever [n  ]; or chills [ n ];                                                                                                                                          Dental: poor dentition[ n ]; Last Dentist visit:   Eye : blurred vision [  ]; diplopia [ n  ]; vision changes [n  ];  Amaurosis fugax[ n ]; Resp: cough [  ];  wheezing[y  ];  hemoptysis[n  ]; shortness of breath[y  ]; paroxysmal nocturnal dyspnea[ n ]; dyspnea on exertion[y  ]; or orthopnea[  ];  GI:  gallstones[  ], vomiting[  ];  dysphagia[  ]; melena[ n ];  hematochezia [ n ]; heartburn[n  ];   Hx of  Colonoscopy[ y ]; GU: kidney stones [  ]; hematuria[  ];   dysuria [  ];  nocturia[  ];  history of     obstruction [  ]; urinary frequency [  ]             Skin: rash, swelling[  ];, hair loss[  ];  peripheral edema[  ];  or itching[  ]; Musculosketetal: myalgias[  ];  joint swelling[  ];  joint erythema[  ];  joint pain[  ];  back pain[  ];  Heme/Lymph: bruising[  ];  bleeding[  ];  anemia[  ];  Neuro: TIA[  ];  headaches[  ];  stroke[  ];  vertigo[  ];  seizures[  ];   paresthesias[  ];  difficulty walking[  ];  Psych:depression[n  ]; anxiety[  n];  Endocrine: diabetes[ y ];  thyroid dysfunction[n  ];  Immunizations: Flu Milo.Brash  ]; Pneumococcal[ y ];  Other:  Physical Exam: BP 143/84  Pulse 87   Resp 20  Ht 5\' 9"  (1.753 m)  Wt 208 lb (94.348 kg)  BMI 30.7 kg/m2  SpO2 98%  General appearance: alert, cooperative and appears older than stated age Neurologic: intact Heart: regular rate and rhythm, S1, S2 normal, no murmur, click, rub or gallop and normal apical impulse Lungs: clear to auscultation bilaterally Abdomen: soft, non-tender; bowel sounds normal; no masses,  no organomegaly Extremities: extremities normal, atraumatic, no cyanosis or edema and Homans sign is negative, no sign of DVT Wound: sternum stable and well healed Patient has no carotid bruits, no cervical or supraclavicular adenopathy.  Diagnostic Studies & Laboratory data:     Recent Radiology Findings:  Dg Chest 2 View  08/14/2013   CLINICAL DATA:  Cough, COPD, former smoker, coronary artery disease post CABG, hypertension, diabetes  EXAM: CHEST  2 VIEW  COMPARISON:  05/24/2012  FINDINGS: Normal heart size post median sternotomy.  Atherosclerotic calcification aorta.  Enlargement of right hilum new since previous exam with subsegmental atelectasis in right mid lung question right hilar mass versus adenopathy.  Underlying emphysematous and bronchitic changes.  Chronic interstitial changes at right base.  No pleural effusion or pneumothorax.  No acute osseous findings.  IMPRESSION: COPD changes with new right hilar enlargement question mass versus adenopathy ; evaluation by CT chest with IV contrast recommended.  Subsegmental atelectasis right mid lung with chronic right basilar interstitial disease.  Findings called to [facility] RN at Dr. Elease Hashimoto office on 08/04/2013 at 1228 hr.   Electronically Signed   By: Ulyses Southward M.D.   On: 08/14/2013 12:29   Ct Chest W Contrast  08/19/2013   *RADIOLOGY REPORT*  Clinical Data: Cough, abnormal chest x-ray.  CT CHEST WITH CONTRAST  Technique:  Multidetector CT imaging of the chest was performed following the standard protocol during bolus administration of intravenous contrast.   Contrast: 80mL OMNIPAQUE IOHEXOL 300 MG/ML  SOLN intravenously.  Comparison: Chest radiograph of August 14, 2013.  Findings: Irregular right hilar mass measuring 4.8 x 3.7 x 3.4 cm is noted concerning for malignancy.  It results in narrowing of the right upper lobe bronchus.  The thoracic aorta appears normal. Status post coronary artery bypass graft.  Pre tracheal lymph node measuring 1 cm is noted.  Precarinal lymph node measuring 14 x 10 mm is noted.  Visualized portion of upper abdomen appears normal. Mild emphysematous change is noted in the upper lobes bilaterally. Right upper lobe ill-defined opacity is noted which may represent postobstructive pneumonia or atelectasis.  Subpleural peri septal thickening is noted suggesting possible scarring or fibrosis.  IMPRESSION: 4.8 cm right hilar mass is  noted consistent with malignancy, with probable associated right upper lobe postobstructive pneumonia or atelectasis.  Smaller lymph nodes are noted in the pretracheal and precarinal regions.   Original Report Authenticated By: Lupita Raider.,  M.D.    Recent Lab Findings: Lab Results  Component Value Date   WBC 7.6 09/19/2011   HGB 14.0 09/19/2011   HCT 41.4 09/19/2011   PLT 276 09/19/2011   GLUCOSE 136* 09/19/2011   NA 136 09/19/2011   K 4.1 09/19/2011   CL 100 09/19/2011   CREATININE 0.82 09/19/2011   BUN 10 09/19/2011   CO2 29 09/19/2011   INR 1.07 01/19/2011      Assessment / Plan:     The CT scan demonstrates a highly suspicious mass or malignancy involving the right hilum, and questionable nodal enlargement. I have discussed with the patient the likely diagnosis of carcinoma of the lung,  Arrangements have been made for a PET scan and MRI of the brain for further staging. Following a PET scan we will proceed with bronchoscopy with EBUS to obtain a tissue diagnosis and decide on further treatment.     Delight Ovens MD      [address] [place] [place]  [address] Office [phone]   Beeper [phone]  08/22/2013 3:49 PM

## 2013-09-01 ENCOUNTER — Encounter (HOSPITAL_COMMUNITY): Payer: Self-pay

## 2013-09-02 ENCOUNTER — Other Ambulatory Visit (HOSPITAL_COMMUNITY): Payer: Medicare Other

## 2013-09-02 ENCOUNTER — Encounter (HOSPITAL_COMMUNITY)
Admission: RE | Admit: 2013-09-02 | Discharge: 2013-09-02 | Disposition: A | Discharge status: Home / Self Care | Payer: Medicare Other | Source: Ambulatory Visit | Attending: Cardiothoracic Surgery | Admitting: Cardiothoracic Surgery

## 2013-09-02 ENCOUNTER — Ambulatory Visit
Admission: RE | Admit: 2013-09-02 | Discharge: 2013-09-02 | Disposition: A | Discharge status: Home / Self Care | Payer: Medicare Other | Source: Ambulatory Visit | Attending: Cardiothoracic Surgery | Admitting: Cardiothoracic Surgery

## 2013-09-02 DIAGNOSIS — D1430 Benign neoplasm of unspecified bronchus and lung: Secondary | ICD-10-CM | POA: Insufficient documentation

## 2013-09-02 DIAGNOSIS — R918 Other nonspecific abnormal finding of lung field: Secondary | ICD-10-CM

## 2013-09-02 DIAGNOSIS — J189 Pneumonia, unspecified organism: Secondary | ICD-10-CM | POA: Insufficient documentation

## 2013-09-02 LAB — GLUCOSE, CAPILLARY: Glucose-Capillary: 207 mg/dL — ABNORMAL HIGH (ref 70–99)

## 2013-09-02 MED ORDER — GADOBENATE DIMEGLUMINE 529 MG/ML IV SOLN
19.0000 mL | Freq: Once | INTRAVENOUS | Status: AC | PRN
  Administered 2013-09-02: 19 mL via INTRAVENOUS

## 2013-09-02 MED ORDER — FLUDEOXYGLUCOSE F - 18 (FDG) INJECTION
18.6000 | Freq: Once | INTRAVENOUS | Status: AC | PRN
  Administered 2013-09-02: 18.6 via INTRAVENOUS

## 2013-09-03 NOTE — Pre-Procedure Instructions (Signed)
VAHE PIENTA  09/03/2013   Your procedure is scheduled on:  09/08/13  Report to [facility]  2 * 3 at 530 AM.  Call this number if you have problems the morning of surgery: [phone]   Remember:   Do not eat food or drink liquids after midnight.   Take these medicines the morning of surgery with A SIP OF WATER: metoprolol,prilosec   Do not wear jewelry, make-up or nail polish.  Do not wear lotions, powders, or perfumes. You may wear deodorant.  Do not shave 48 hours prior to surgery. Men may shave face and neck.  Do not bring valuables to the hospital.  [facility] is not responsible                  for any belongings or valuables.               Contacts, dentures or bridgework may not be worn into surgery.  Leave suitcase in the car. After surgery it may be brought to your room.  For patients admitted to the hospital, discharge time is determined by your                treatment team.               Patients discharged the day of surgery will not be allowed to drive  home.  Name and phone number of your driver: family  Special Instructions: Shower using CHG 2 nights before surgery and the night before surgery.  If you shower the day of surgery use CHG.  Use special wash - you have one bottle of CHG for all showers.  You should use approximately 1/3 of the bottle for each shower.   Please read over the following fact sheets that you were given: Pain Booklet, Coughing and Deep Breathing and Surgical Site Infection Prevention

## 2013-09-04 ENCOUNTER — Encounter (HOSPITAL_COMMUNITY): Payer: Self-pay

## 2013-09-04 ENCOUNTER — Encounter (HOSPITAL_COMMUNITY)
Admission: RE | Admit: 2013-09-04 | Discharge: 2013-09-04 | Disposition: A | Discharge status: Home / Self Care | Payer: Medicare Other | Source: Ambulatory Visit | Attending: Cardiothoracic Surgery | Admitting: Cardiothoracic Surgery

## 2013-09-04 VITALS — BP 129/77 | HR 98 | Temp 98.1°F | Resp 20 | Ht 69.0 in | Wt 211.7 lb

## 2013-09-04 DIAGNOSIS — Z01818 Encounter for other preprocedural examination: Secondary | ICD-10-CM | POA: Insufficient documentation

## 2013-09-04 DIAGNOSIS — R918 Other nonspecific abnormal finding of lung field: Secondary | ICD-10-CM

## 2013-09-04 DIAGNOSIS — Z01812 Encounter for preprocedural laboratory examination: Secondary | ICD-10-CM | POA: Insufficient documentation

## 2013-09-04 HISTORY — DX: Malignant (primary) neoplasm, unspecified: C80.1

## 2013-09-04 LAB — APTT: aPTT: 32 seconds (ref 24–37)

## 2013-09-04 LAB — COMPREHENSIVE METABOLIC PANEL
ALT: 10 U/L (ref 0–53)
AST: 13 U/L (ref 0–37)
Albumin: 3.3 g/dL — ABNORMAL LOW (ref 3.5–5.2)
Alkaline Phosphatase: 78 U/L (ref 39–117)
BUN: 10 mg/dL (ref 6–23)
CO2: 28 mEq/L (ref 19–32)
Calcium: 9.4 mg/dL (ref 8.4–10.5)
Chloride: 98 mEq/L (ref 96–112)
Creatinine, Ser: 0.82 mg/dL (ref 0.50–1.35)
GFR calc Af Amer: >90 mL/min (ref >90)
GFR calc non Af Amer: 86 mL/min — ABNORMAL LOW (ref >90)
Glucose, Bld: 223 mg/dL — ABNORMAL HIGH (ref 70–99)
Potassium: 4.3 mEq/L (ref 3.5–5.1)
Sodium: 135 mEq/L (ref 135–145)
Total Bilirubin: 0.7 mg/dL (ref 0.3–1.2)
Total Protein: 7.3 g/dL (ref 6.0–8.3)

## 2013-09-04 LAB — CBC
HCT: 40.8 % (ref 39.0–52.0)
Hemoglobin: 14.3 g/dL (ref 13.0–17.0)
MCH: 31.5 pg (ref 26.0–34.0)
MCHC: 35 g/dL (ref 30.0–36.0)
MCV: 89.9 fL (ref 78.0–100.0)
Platelets: 281 10*3/uL (ref 150–400)
RBC: 4.54 MIL/uL (ref 4.22–5.81)
RDW: 13.5 % (ref 11.5–15.5)
WBC: 12 10*3/uL — ABNORMAL HIGH (ref 4.0–10.5)

## 2013-09-04 LAB — PROTIME-INR
INR: 1.04 (ref 0.00–1.49)
Prothrombin Time: 13.4 seconds (ref 11.6–15.2)

## 2013-09-08 ENCOUNTER — Ambulatory Visit (HOSPITAL_COMMUNITY): Payer: Medicare Other

## 2013-09-08 ENCOUNTER — Ambulatory Visit (HOSPITAL_COMMUNITY): Payer: Medicare Other | Admitting: Certified Registered"

## 2013-09-08 ENCOUNTER — Ambulatory Visit (HOSPITAL_COMMUNITY)
Admission: RE | Admit: 2013-09-08 | Discharge: 2013-09-08 | Disposition: A | Discharge status: Home / Self Care | Payer: Medicare Other | Source: Ambulatory Visit | Attending: Cardiothoracic Surgery | Admitting: Cardiothoracic Surgery

## 2013-09-08 ENCOUNTER — Encounter (HOSPITAL_COMMUNITY)
Admission: RE | Disposition: A | Discharge status: Home / Self Care | Payer: Self-pay | Source: Ambulatory Visit | Attending: Cardiothoracic Surgery

## 2013-09-08 ENCOUNTER — Encounter (HOSPITAL_COMMUNITY): Payer: Self-pay | Admitting: *Deleted

## 2013-09-08 ENCOUNTER — Encounter (HOSPITAL_COMMUNITY): Payer: Self-pay | Admitting: Certified Registered"

## 2013-09-08 ENCOUNTER — Other Ambulatory Visit: Payer: Self-pay | Admitting: Cardiology

## 2013-09-08 DIAGNOSIS — D381 Neoplasm of uncertain behavior of trachea, bronchus and lung: Secondary | ICD-10-CM

## 2013-09-08 DIAGNOSIS — R918 Other nonspecific abnormal finding of lung field: Secondary | ICD-10-CM

## 2013-09-08 DIAGNOSIS — Z951 Presence of aortocoronary bypass graft: Secondary | ICD-10-CM | POA: Insufficient documentation

## 2013-09-08 DIAGNOSIS — J438 Other emphysema: Secondary | ICD-10-CM | POA: Insufficient documentation

## 2013-09-08 DIAGNOSIS — I10 Essential (primary) hypertension: Secondary | ICD-10-CM | POA: Insufficient documentation

## 2013-09-08 DIAGNOSIS — R222 Localized swelling, mass and lump, trunk: Secondary | ICD-10-CM | POA: Insufficient documentation

## 2013-09-08 DIAGNOSIS — C771 Secondary and unspecified malignant neoplasm of intrathoracic lymph nodes: Secondary | ICD-10-CM | POA: Insufficient documentation

## 2013-09-08 DIAGNOSIS — C3490 Malignant neoplasm of unspecified part of unspecified bronchus or lung: Secondary | ICD-10-CM | POA: Insufficient documentation

## 2013-09-08 DIAGNOSIS — E119 Type 2 diabetes mellitus without complications: Secondary | ICD-10-CM | POA: Insufficient documentation

## 2013-09-08 DIAGNOSIS — I2510 Atherosclerotic heart disease of native coronary artery without angina pectoris: Secondary | ICD-10-CM | POA: Insufficient documentation

## 2013-09-08 HISTORY — PX: VIDEO BRONCHOSCOPY WITH ENDOBRONCHIAL ULTRASOUND: SHX6177

## 2013-09-08 LAB — GLUCOSE, CAPILLARY
Glucose-Capillary: 172 mg/dL — ABNORMAL HIGH (ref 70–99)
Glucose-Capillary: 153 mg/dL — ABNORMAL HIGH (ref 70–99)

## 2013-09-08 SURGERY — BRONCHOSCOPY, WITH EBUS
Anesthesia: General | Site: Chest | Wound class: Clean Contaminated

## 2013-09-08 MED ORDER — VECURONIUM BROMIDE 10 MG IV SOLR
INTRAVENOUS | Status: DC | PRN
  Administered 2013-09-08 (×2): 1 mg via INTRAVENOUS

## 2013-09-08 MED ORDER — HYDROMORPHONE HCL PF 1 MG/ML IJ SOLN: 0.2500 mg | INTRAMUSCULAR | Status: DC | PRN

## 2013-09-08 MED ORDER — OXYCODONE HCL 5 MG/5ML PO SOLN: 5.0000 mg | Freq: Once | ORAL | Status: DC | PRN

## 2013-09-08 MED ORDER — LACTATED RINGERS IV SOLN
INTRAVENOUS | Status: DC | PRN
  Administered 2013-09-08 (×2): via INTRAVENOUS

## 2013-09-08 MED ORDER — OXYCODONE HCL 5 MG PO TABS: 5.0000 mg | ORAL_TABLET | Freq: Once | ORAL | Status: DC | PRN

## 2013-09-08 MED ORDER — ONDANSETRON HCL 4 MG/2ML IJ SOLN
INTRAMUSCULAR | Status: DC | PRN
  Administered 2013-09-08: 4 mg via INTRAVENOUS

## 2013-09-08 MED ORDER — PHENYLEPHRINE HCL 10 MG/ML IJ SOLN
INTRAMUSCULAR | Status: DC | PRN
  Administered 2013-09-08: 40 ug via INTRAVENOUS
  Administered 2013-09-08 (×2): 120 ug via INTRAVENOUS
  Administered 2013-09-08 (×4): 80 ug via INTRAVENOUS
  Administered 2013-09-08: 40 ug via INTRAVENOUS

## 2013-09-08 MED ORDER — 0.9 % SODIUM CHLORIDE (POUR BTL) OPTIME
TOPICAL | Status: DC | PRN
  Administered 2013-09-08: 1000 mL

## 2013-09-08 MED ORDER — GLYCOPYRROLATE 0.2 MG/ML IJ SOLN
INTRAMUSCULAR | Status: DC | PRN
  Administered 2013-09-08: 0.6 mg via INTRAVENOUS

## 2013-09-08 MED ORDER — ONDANSETRON HCL 4 MG/2ML IJ SOLN: 4.0000 mg | Freq: Once | INTRAMUSCULAR | Status: DC | PRN

## 2013-09-08 MED ORDER — ARTIFICIAL TEARS OP OINT
TOPICAL_OINTMENT | OPHTHALMIC | Status: DC | PRN
  Administered 2013-09-08: 1 via OPHTHALMIC

## 2013-09-08 MED ORDER — PROPOFOL 10 MG/ML IV BOLUS
INTRAVENOUS | Status: DC | PRN
  Administered 2013-09-08: 100 mg via INTRAVENOUS

## 2013-09-08 MED ORDER — LIDOCAINE HCL (CARDIAC) 20 MG/ML IV SOLN
INTRAVENOUS | Status: DC | PRN
  Administered 2013-09-08: 100 mg via INTRAVENOUS

## 2013-09-08 MED ORDER — ROCURONIUM BROMIDE 100 MG/10ML IV SOLN
INTRAVENOUS | Status: DC | PRN
  Administered 2013-09-08: 50 mg via INTRAVENOUS

## 2013-09-08 MED ORDER — MEPERIDINE HCL 25 MG/ML IJ SOLN: 6.2500 mg | INTRAMUSCULAR | Status: DC | PRN

## 2013-09-08 MED ORDER — FENTANYL CITRATE 0.05 MG/ML IJ SOLN
INTRAMUSCULAR | Status: DC | PRN
  Administered 2013-09-08: 100 ug via INTRAVENOUS
  Administered 2013-09-08: 50 ug via INTRAVENOUS

## 2013-09-08 MED ORDER — CEFAZOLIN SODIUM-DEXTROSE 2-3 GM-% IV SOLR
INTRAVENOUS | Status: DC | PRN
  Administered 2013-09-08: 2 g via INTRAVENOUS

## 2013-09-08 MED ORDER — NEOSTIGMINE METHYLSULFATE 1 MG/ML IJ SOLN
INTRAMUSCULAR | Status: DC | PRN
  Administered 2013-09-08: 4 mg via INTRAVENOUS

## 2013-09-08 MED ORDER — MIDAZOLAM HCL 5 MG/5ML IJ SOLN
INTRAMUSCULAR | Status: DC | PRN
  Administered 2013-09-08 (×2): 1 mg via INTRAVENOUS

## 2013-09-08 MED ORDER — CEFAZOLIN SODIUM-DEXTROSE 2-3 GM-% IV SOLR
INTRAVENOUS | Status: AC
  Filled 2013-09-08: qty 50

## 2013-09-08 SURGICAL SUPPLY — 25 items
BRUSH CYTOL CELLEBRITY 1.5X140 (MISCELLANEOUS) IMPLANT
CANISTER SUCTION 2500CC (MISCELLANEOUS) ×2 IMPLANT
CONT SPEC 4OZ CLIKSEAL STRL BL (MISCELLANEOUS) ×2 IMPLANT
COTTONBALL LRG STERILE PKG (GAUZE/BANDAGES/DRESSINGS) IMPLANT
COVER TABLE BACK 60X90 (DRAPES) ×2 IMPLANT
DRSG AQUACEL AG ADV 3.5X14 (GAUZE/BANDAGES/DRESSINGS) IMPLANT
FORCEPS BIOP RJ4 1.8 (CUTTING FORCEPS) ×2 IMPLANT
GLOVE BIO SURGEON STRL SZ 6.5 (GLOVE) ×2 IMPLANT
KIT ROOM TURNOVER OR (KITS) ×2 IMPLANT
MARKER SKIN DUAL TIP RULER LAB (MISCELLANEOUS) ×2 IMPLANT
NEEDLE 22X1 1/2 (OR ONLY) (NEEDLE) IMPLANT
NEEDLE BIOPSY TRANSBRONCH 21G (NEEDLE) IMPLANT
NEEDLE BLUNT 18X1 FOR OR ONLY (NEEDLE) IMPLANT
NEEDLE SYS SONOTIP II EBUSTBNA (NEEDLE) ×4 IMPLANT
NS IRRIG 1000ML POUR BTL (IV SOLUTION) ×2 IMPLANT
OIL SILICONE PENTAX (PARTS (SERVICE/REPAIRS)) ×2 IMPLANT
PAD ARMBOARD 7.5X6 YLW CONV (MISCELLANEOUS) ×4 IMPLANT
SPONGE GAUZE 4X4 12PLY (GAUZE/BANDAGES/DRESSINGS) IMPLANT
SYR 20CC LL (SYRINGE) ×2 IMPLANT
SYR 20ML ECCENTRIC (SYRINGE) ×2 IMPLANT
SYR 5ML LUER SLIP (SYRINGE) ×2 IMPLANT
SYR CONTROL 10ML LL (SYRINGE) IMPLANT
TOWEL OR 17X24 6PK STRL BLUE (TOWEL DISPOSABLE) ×2 IMPLANT
TRAP SPECIMEN MUCOUS 40CC (MISCELLANEOUS) ×4 IMPLANT
TUBE CONNECTING 12X1/4 (SUCTIONS) ×2 IMPLANT

## 2013-09-08 NOTE — Transfer of Care (Signed)
Immediate Anesthesia Transfer of Care Note  Patient: David [name]  Procedure(s) Performed: Procedure(s): VIDEO BRONCHOSCOPY WITH ENDOBRONCHIAL ULTRASOUND (N/A)  Patient Location: PACU  Anesthesia Type:General  Level of Consciousness: awake, alert  and oriented  Airway & Oxygen Therapy: Patient Spontanous Breathing and Patient connected to nasal cannula oxygen  Post-op Assessment: Report given to PACU RN  Post vital signs: Reviewed and stable  Complications: No apparent anesthesia complications

## 2013-09-08 NOTE — Preoperative (Signed)
Beta Blockers   Reason not to administer Beta Blockers:Not Applicable 

## 2013-09-08 NOTE — Anesthesia Preprocedure Evaluation (Addendum)
Anesthesia Evaluation  Patient identified by MRN, date of birth, ID band Patient awake    Reviewed: Allergy & Precautions, H&P , NPO status , Patient's Chart, lab work & pertinent test results  Airway Mallampati: I TM Distance: >3 FB Neck ROM: Full    Dental  (+) Teeth Intact and Dental Advisory Given   Pulmonary COPD         Cardiovascular hypertension, Pt. on home beta blockers + CAD Rhythm:Regular Rate:Normal     Neuro/Psych    GI/Hepatic GERD-  Medicated and Controlled,  Endo/Other  diabetes, Type 2, Oral Hypoglycemic Agents  Renal/GU      Musculoskeletal   Abdominal   Peds  Hematology   Anesthesia Other Findings   Reproductive/Obstetrics                        Anesthesia Physical Anesthesia Plan  ASA: III  Anesthesia Plan: General   Post-op Pain Management:    Induction: Intravenous  Airway Management Planned: Oral ETT  Additional Equipment:   Intra-op Plan:   Post-operative Plan: Extubation in OR  Informed Consent: I have reviewed the patients History and Physical, chart, labs and discussed the procedure including the risks, benefits and alternatives for the proposed anesthesia with the patient or authorized representative who has indicated his/her understanding and acceptance.     Plan Discussed with: CRNA and Surgeon  Anesthesia Plan Comments:         Anesthesia Quick Evaluation

## 2013-09-08 NOTE — H&P (Signed)
[address]       [place] [address]             [phone]                        David [name] [facility] Medical Record #[id number] Date of Birth: 1941/07/06  Referring: [name], MD Primary Care: [name], MD  Chief Complaint:    Cough and wheezing   History of Present Illness:    Patient is a 72 year old male who presents with approximately three-week history of some increasing shortness of breath with exertion cough and episodic wheezing. He denies fevers or chills. He had a routine visit with his primary care doctor who obtained a chest x-ray and subsequently a CT scan of his demonstrated a large right hilar mass. The patient is referred to thoracic surgery for further evaluation and treatment.  He previously had coronary artery bypass grafting in 2002, a followup cardiac catheterization was done in 2012, a chest x-ray at that time showed no evidence of hilar mass.      Current Activity/ Functional Status:  Patient is independent with mobility/ambulation, transfers, ADL's, IADL's.  Zubrod Score: At the time of surgery this patient's most appropriate activity status/level should be described as: []  Normal activity, no symptoms [x]  Symptoms, fully ambulatory []  Symptoms, in bed less than or equal to 50% of the time []  Symptoms, in bed greater than 50% of the time but less than 100% []  Bedridden []  Moribund   Past Medical History  Diagnosis Date  . Coronary artery disease     s/p PCI 2000 and single vessel CABG 2001  . Hypercholesterolemia   . GERD (gastroesophageal reflux disease)   . Tubular adenoma 2003  . COPD (chronic obstructive pulmonary disease)   . Diabetes mellitus   . Arthritis   . Allergic rhinitis   . Cancer     lung mass  . Hypertension     Dr Excell Seltzer    Past Surgical History  Procedure Laterality Date  . Left rotator cuff repair    . Left knee arthroscopy   x2    for meniscal tear    . Coronary stent placement  2001  . Right knee arthroscopy    . Bilateral carpal tunnel release    . Right shoulder arthroscopy    . Coronary artery bypass graft      occlusion of the stent and underwent one-vessel CABG in 2002  . Coronary angioplasty    . Colonoscopy  09/13/2011    Procedure: COLONOSCOPY;  Surgeon: Malissa Hippo, MD;  Location: [facility];  Service: Endoscopy;  Laterality: N/A;  . Cataract extraction w/phaco  09/25/2011    Procedure: CATARACT EXTRACTION PHACO AND INTRAOCULAR LENS PLACEMENT (IOC);  Surgeon: Gemma Payor;  Location: [facility] ORS;  Service: Ophthalmology;  Laterality: Left;  CDE:10.79  . Cataract extraction w/phaco  10/05/2011    Procedure: CATARACT EXTRACTION PHACO AND INTRAOCULAR LENS PLACEMENT (IOC);  Surgeon: Gemma Payor;  Location: [facility];  Service: Ophthalmology;  Laterality: Right;  CDE: 8.45    Family History  Problem Relation Age of Onset  . Heart failure Mother   . Cancer Father     lung canger  . Cancer Brother     lung cancer  . Anesthesia problems Neg Hx   . Hypotension Neg Hx   . Malignant hyperthermia Neg Hx   .  Pseudochol deficiency Neg Hx       History  Smoking status  . Former Smoker -- 1.50 packs/day for 40 years  . Types: Cigarettes  . Quit date: 09/19/1995  Smokeless tobacco  . Not on file    History  Alcohol Use  . 7.0 oz/week  . 14 drink(s) per week    Comment: scotch 1 shot daily     No Known Allergies  No current facility-administered medications for this encounter.       Review of Systems:     Cardiac Review of Systems: Y or N  Chest Pain [ n   ]  Resting SOB [  n ] Exertional SOB  [ y ]  Orthopnea [ n ]   Pedal Edema [ y  ]    Palpitations [n  ] Syncope  [n  ]   Presyncope [ n  ]  General Review of Systems: [Y] = yes [  ]=no Constitional: recent weight change [n  ]; anorexia [  ]; fatigue [  ]; nausea [  ]; night sweats [ n ]; fever [n  ]; or chills [ n ];                                                                                                                                           Dental: poor dentition[ n ]; Last Dentist visit:   Eye : blurred vision [  ]; diplopia [ n  ]; vision changes [n  ];  Amaurosis fugax[ n ]; Resp: cough [  ];  wheezing[y  ];  hemoptysis[n  ]; shortness of breath[y  ]; paroxysmal nocturnal dyspnea[ n ]; dyspnea on exertion[y  ]; or orthopnea[  ];  GI:  gallstones[  ], vomiting[  ];  dysphagia[  ]; melena[ n ];  hematochezia [ n ]; heartburn[n  ];   Hx of  Colonoscopy[ y ]; GU: kidney stones [  ]; hematuria[  ];   dysuria [  ];  nocturia[  ];  history of     obstruction [  ]; urinary frequency [  ]             Skin: rash, swelling[  ];, hair loss[  ];  peripheral edema[  ];  or itching[  ]; Musculosketetal: myalgias[  ];  joint swelling[  ];  joint erythema[  ];  joint pain[  ];  back pain[  ];  Heme/Lymph: bruising[  ];  bleeding[  ];  anemia[  ];  Neuro: TIA[  ];  headaches[  ];  stroke[  ];  vertigo[  ];  seizures[  ];   paresthesias[  ];  difficulty walking[  ];  Psych:depression[n  ]; anxiety[  n];  Endocrine: diabetes[ y ];  thyroid dysfunction[n  ];  Immunizations: Flu Milo.Brash  ]; Pneumococcal[ y ];  Other:  Physical Exam: BP 150/74  Pulse 74  Temp(Src) 97.3 F (36.3 C) (Oral)  Resp 20  SpO2 98%  General appearance: alert, cooperative and appears older than stated age Neurologic: intact Heart: regular rate and rhythm, S1, S2 normal, no murmur, click, rub or gallop and normal apical impulse Lungs: clear to auscultation bilaterally Abdomen: soft, non-tender; bowel sounds normal; no masses,  no organomegaly Extremities: extremities normal, atraumatic, no cyanosis or edema and Homans sign is negative, no sign of DVT Wound: sternum stable and well healed Patient has no carotid bruits, no cervical or supraclavicular adenopathy.  Diagnostic Studies & Laboratory data:     Recent Radiology Findings: Dg Chest 2 View Within Previous 72 Hours.  Films  Obtained On Friday Are Acceptable For Monday And Tuesday Cases  09/08/2013   CLINICAL DATA:  Preoperative films for biopsy of lung mass.  EXAM: CHEST  2 VIEW  COMPARISON:  CT chest 08/19/2013. PET scan 09/02/2013.  FINDINGS: A posterior right upper lobe lung mass is again noted. Emphysematous changes are evident. No additional airspace disease is present. The heart size is normal. The visualized soft tissues and bony thorax are unremarkable.  IMPRESSION: 1. Posterior right upper lobe lung mass. 2. Emphysema. 3. No acute cardiopulmonary disease.   Electronically Signed   By: Gennette Pac M.D.   On: 09/08/2013 07:15   Dg Chest 2 View  08/14/2013   CLINICAL DATA:  Cough, COPD, former smoker, coronary artery disease post CABG, hypertension, diabetes  EXAM: CHEST  2 VIEW  COMPARISON:  05/24/2012  FINDINGS: Normal heart size post median sternotomy.  Atherosclerotic calcification aorta.  Enlargement of right hilum new since previous exam with subsegmental atelectasis in right mid lung question right hilar mass versus adenopathy.  Underlying emphysematous and bronchitic changes.  Chronic interstitial changes at right base.  No pleural effusion or pneumothorax.  No acute osseous findings.  IMPRESSION: COPD changes with new right hilar enlargement question mass versus adenopathy ; evaluation by CT chest with IV contrast recommended.  Subsegmental atelectasis right mid lung with chronic right basilar interstitial disease.  Findings called to [facility] RN at Dr. Elease Hashimoto office on 08/04/2013 at 1228 hr.   Electronically Signed   By: Ulyses Southward M.D.   On: 08/14/2013 12:29   Ct Chest W Contrast  08/19/2013   *RADIOLOGY REPORT*  Clinical Data: Cough, abnormal chest x-ray.  CT CHEST WITH CONTRAST  Technique:  Multidetector CT imaging of the chest was performed following the standard protocol during bolus administration of intravenous contrast.  Contrast: 80mL OMNIPAQUE IOHEXOL 300 MG/ML  SOLN intravenously.  Comparison: Chest  radiograph of August 14, 2013.  Findings: Irregular right hilar mass measuring 4.8 x 3.7 x 3.4 cm is noted concerning for malignancy.  It results in narrowing of the right upper lobe bronchus.  The thoracic aorta appears normal. Status post coronary artery bypass graft.  Pre tracheal lymph node measuring 1 cm is noted.  Precarinal lymph node measuring 14 x 10 mm is noted.  Visualized portion of upper abdomen appears normal. Mild emphysematous change is noted in the upper lobes bilaterally. Right upper lobe ill-defined opacity is noted which may represent postobstructive pneumonia or atelectasis.  Subpleural peri septal thickening is noted suggesting possible scarring or fibrosis.  IMPRESSION: 4.8 cm right hilar mass is noted consistent with malignancy, with probable associated right upper lobe postobstructive pneumonia or atelectasis.  Smaller lymph nodes are noted in the pretracheal and precarinal regions.   Original Report Authenticated By: Lupita Raider.,  M.D.  Mr Laqueta Jean Wo Contrast  09/02/2013   CLINICAL DATA:  72 year old male with recent diagnosis of lung mass. Staging. Initial encounter.  BUN and creatinine were obtained on site at [facility] at  [address]  Results:  BUN 12 mg/dL,  Creatinine 0.9 mg/dL.  Estimated GFR 83.  EXAM: MRI HEAD WITHOUT AND WITH CONTRAST  TECHNIQUE: Multiplanar, multiecho pulse sequences of the brain and surrounding structures were obtained according to standard protocol without and with intravenous contrast  CONTRAST:  19mL MULTIHANCE GADOBENATE DIMEGLUMINE 529 MG/ML IV SOLN  COMPARISON:  Paranasal sinus CT 04/10/2006.  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 No abnormal enhancement identified. No midline shift, mass effect, or intracranial mass lesion identified.  No restricted diffusion to suggest acute infarction. No ventriculomegaly, extra-axial collection or acute intracranial hemorrhage.  Cervicomedullary junction and pituitary are within normal limits. Negative visualized cervical spine. Normal bone marrow signal.  Minimal to mild for age scattered nonspecific cerebral white matter T2 and FLAIR hyperintensity. Normal visualized internal auditory structures. Trace left mastoid fluid.  Postoperative changes to the globes. Mild ethmoid sinus mucosal thickening. Negative scalp soft tissues.  IMPRESSION: No acute or metastatic intracranial abnormality. Negative for age MRI appearance of the brain.   Electronically Signed   By: Augusto Gamble M.D.   On: 09/02/2013 15:51   Nm Pet Image Initial (pi) Skull Base To Thigh  09/02/2013   CLINICAL DATA:  Initial treatment strategy for right lung mass.  EXAM: NUCLEAR MEDICINE PET SKULL BASE TO THIGH  FASTING BLOOD GLUCOSE:  Value:  207 mg/dl  TECHNIQUE: 16.1 mCi W-96 FDG was injected intravenously. CT data was obtained and used for attenuation correction and anatomic localization only. (This was not acquired as a diagnostic CT examination.) Additional exam technical data entered on technologist worksheet.  COMPARISON:  Chest CT on 08/19/2013  FINDINGS: NECK  No hypermetabolic lymph nodes in the neck.  CHEST  Hypermetabolic mass is seen in the central right upper lobe which involves the right hilum. This has a maximum SUV of 18.1. Mild hypermetabolic postobstructive pneumonitis seen in the peripheral right upper lobe.  No hypermetabolic mediastinal or left hilar lymph nodes are identified.  ABDOMEN/PELVIS  No abnormal hypermetabolic activity within the liver, pancreas, adrenal glands, or spleen. No hypermetabolic lymph nodes in the abdomen or pelvis.  SKELETON  No focal hypermetabolic activity to suggest skeletal metastasis.  IMPRESSION: Hypermetabolic central right upper lobe mass with involvement of the right hilum. Mild postobstructive pneumonitis noted in the peripheral right upper lobe.  No evidence hypermetabolic mediastinal lymph nodes or extrathoracic  metastatic disease.   Electronically Signed   By: Myles Rosenthal   On: 09/02/2013 16:21   Dg Chest 2 View    Recent Lab Findings: Lab Results  Component Value Date   WBC 12.0* 09/04/2013   HGB 14.3 09/04/2013   HCT 40.8 09/04/2013   PLT 281 09/04/2013   GLUCOSE 223* 09/04/2013   ALT 10 09/04/2013   AST 13 09/04/2013   NA 135 09/04/2013   K 4.3 09/04/2013   CL 98 09/04/2013   CREATININE 0.82 09/04/2013   BUN 10 09/04/2013   CO2 28 09/04/2013   INR 1.04 09/04/2013      Assessment / Plan:     The CT scan and PET demonstrates a highly suspicious mass or malignancy involving the right hilum, and questionable nodal enlargement. I have discussed with the patient the likely diagnosis of carcinoma of the lung,  Will proceed today with Bronch/ EBUS for tissue and staging The goals risks and alternatives of the planned surgical procedure Bronchoscopy, EBUS  have been discussed with the patient in detail. The risks of the procedure including death, infection, stroke, myocardial infarction, bleeding, blood transfusion have all been discussed specifically.  I have quoted [name] a 1 % of perioperative mortality and a complication rate as high as 10 %. The patient's questions have been answered.[name] is willing  to proceed with the planned procedure.    Delight Ovens MD   [address] [place] [place] [address] Office [phone]   Beeper [phone]  09/08/2013 7:20 AM

## 2013-09-08 NOTE — Brief Op Note (Addendum)
      [address]       [place] [address]             [phone]      09/08/2013  9:17 AM  PATIENT:  [name]  72 y.o. male  PRE-OPERATIVE DIAGNOSIS:  Right Hilar Mass  POST-OPERATIVE DIAGNOSIS:  Right Hilar Mass, $R nodes postive for non small cell lung cancer, orther BX pending  PROCEDURE:  Procedure(s): VIDEO BRONCHOSCOPY WITH ENDOBRONCHIAL ULTRASOUND (N/A) and BX of Intrabronchial lesion, BX #7,#4R,#10R nodes   SURGEON:  Surgeon(s) and Role:    * Delight Ovens, MD - Primary    ANESTHESIA:   general  EBL:     BLOOD ADMINISTERED:none  DRAINS: none   LOCAL MEDICATIONS USED:  NONE  SPECIMEN:  Source of Specimen:  rt upper lobe mass, fine needle aspiration #7,4R,10R nodes, Fine Needle Aspirate and Lavage/Washing  DISPOSITION OF SPECIMEN:  PATHOLOGY  COUNTS:  YES  noneDICTATION: .Other Dictation: Dictation Number [id number]  PLAN OF CARE: Discharge to home after PACU  PATIENT DISPOSITION:  PACU - hemodynamically stable.   Delay start of Pharmacological VTE agent (>24hrs) due to surgical blood loss or risk of bleeding: yes

## 2013-09-08 NOTE — Anesthesia Postprocedure Evaluation (Signed)
Anesthesia Post Note  Patient: David [name]  Procedure(s) Performed: Procedure(s) (LRB): VIDEO BRONCHOSCOPY WITH ENDOBRONCHIAL ULTRASOUND (N/A)  Anesthesia type: general  Patient location: PACU  Post pain: Pain level controlled  Post assessment: Patient's Cardiovascular Status Stable  Last Vitals:  Filed Vitals:   09/08/13 1125  BP:   Pulse:   Temp: 36.4 C  Resp:     Post vital signs: Reviewed and stable  Level of consciousness: sedated  Complications: No apparent anesthesia complications

## 2013-09-08 NOTE — Anesthesia Procedure Notes (Signed)
Procedure Name: Intubation Date/Time: 09/08/2013 7:42 AM Performed by: Jefm Miles E Pre-anesthesia Checklist: Patient identified, Timeout performed, Emergency Drugs available, Suction available and Patient being monitored Patient Re-evaluated:Patient Re-evaluated prior to inductionOxygen Delivery Method: Circle system utilized Preoxygenation: Pre-oxygenation with 100% oxygen Intubation Type: IV induction Ventilation: Mask ventilation without difficulty Laryngoscope Size: Mac and 3 Grade View: Grade I Tube type: Oral Tube size: 8.5 mm Number of attempts: 1 Airway Equipment and Method: Stylet Placement Confirmation: ETT inserted through vocal cords under direct vision,  positive ETCO2 and breath sounds checked- equal and bilateral Secured at: 23 cm Tube secured with: Tape Dental Injury: Teeth and Oropharynx as per pre-operative assessment

## 2013-09-09 NOTE — Op Note (Signed)
NAME:  [name], David             ACCOUNT NO.:  [id number]  MEDICAL RECORD NO.:  [id number]  LOCATION:  [facility]                         FACILITY:  [facility]  PHYSICIAN:  Sheliah Plane, MD    DATE OF BIRTH:  07-15-1941  DATE OF PROCEDURE:  09/08/2013 DATE OF DISCHARGE:  09/08/2013                              OPERATIVE REPORT   PREOPERATIVE DIAGNOSES:  Right upper lung mass, right hilar mass suspicious for carcinoma of the lung.  POSTOPERATIVE DIAGNOSES:  Right upper lung mass, right hilar mass suspicious for carcinoma of the lung.  SURGICAL PROCEDURE:  Bronchoscopy with biopsy and EBUS with transbronchial needle aspiration.  SURGEON:  Sheliah Plane, MD  BRIEF HISTORY:  The patient is a 72 year old male who presents with increasing wheezing and cough.  On chest x-ray, CT scan, PET scan, and MRI appears to have a right upper lobe right hilar mass highly suspicious and hypermetabolic for carcinoma of the lung.  There was no evidence of distant metastasis on PET scan or on MRI of the brain.  For further staging and to obtain a tissue diagnosis, bronchoscopy with biopsy and EBUS with biopsy was recommended to the patient who agreed and signed informed consent.  DESCRIPTION OF PROCEDURE:  The patient underwent general endotracheal anesthesia without incident.  After appropriate time-out was performed, a fiberoptic bronchoscope was passed through the endotracheal tube first examining the left tracheobronchial tree to the subsegmental level without endobronchial lesions.  The scope was then removed and the right tracheobronchial tree was examined.  In the R3 segmental bronchus was evidence of endobronchial lesion.  The scope was removed and the EBUS scope was replaced.  With positioning of the EBUS scope into the left mainstem bronchus, #7 nodes were identified and 4 separate needle aspirations were obtained.  The scope was then moved into the right mainstem bronchus and 4R lymph nodes  were identified.  These appeared larger on ultrasound and 4 separate needle aspirations were obtained in this area and in a similar fashion at the takeoff of the right upper lobe bronchus, needle aspirations of 10R lymph node were performed. After obtaining these biopsies, we went back with the standard bronchoscope and biopsied the endobronchial lesion.  The specimens were submitted to Pathology.  The initial smears of the 4R and 7 lymph nodes revealed evidence of non-small-cell lung cancer in the 4R lymph node. The remainder of the tissue was preserved for permanent examination. The bronchial washings from the right upper lobe were submitted to Pathology both for culture and cytology.  The tracheobronchial tree was suctioned of mucus and the scope was removed. The patient was awakened in the operating room, extubated, and transferred to the recovery room having tolerated the procedure without obvious complications.     Sheliah Plane, MD     EG/MEDQ  D:  09/09/2013  T:  09/09/2013  Job:  [id number]

## 2013-09-10 ENCOUNTER — Telehealth: Payer: Self-pay | Admitting: Internal Medicine

## 2013-09-10 ENCOUNTER — Encounter (HOSPITAL_COMMUNITY): Payer: Self-pay | Admitting: Cardiothoracic Surgery

## 2013-09-10 LAB — CULTURE, RESPIRATORY W GRAM STAIN: Culture: NO GROWTH

## 2013-09-10 NOTE — Telephone Encounter (Signed)
C/D 09/10/13 for appt. 09/11/13 °

## 2013-09-11 ENCOUNTER — Ambulatory Visit (HOSPITAL_BASED_OUTPATIENT_CLINIC_OR_DEPARTMENT_OTHER): Payer: Medicare Other | Admitting: Internal Medicine

## 2013-09-11 ENCOUNTER — Ambulatory Visit: Payer: Medicare Other | Attending: Internal Medicine | Admitting: Physical Therapy

## 2013-09-11 ENCOUNTER — Telehealth: Payer: Self-pay | Admitting: Internal Medicine

## 2013-09-11 ENCOUNTER — Encounter: Payer: Self-pay | Admitting: Radiation Oncology

## 2013-09-11 ENCOUNTER — Encounter: Payer: Self-pay | Admitting: *Deleted

## 2013-09-11 ENCOUNTER — Ambulatory Visit (INDEPENDENT_AMBULATORY_CARE_PROVIDER_SITE_OTHER): Payer: Self-pay | Admitting: Cardiothoracic Surgery

## 2013-09-11 ENCOUNTER — Encounter: Payer: Self-pay | Admitting: Cardiothoracic Surgery

## 2013-09-11 ENCOUNTER — Encounter: Payer: Self-pay | Admitting: Internal Medicine

## 2013-09-11 ENCOUNTER — Ambulatory Visit
Admission: RE | Admit: 2013-09-11 | Discharge: 2013-09-11 | Disposition: A | Discharge status: Home / Self Care | Payer: Medicare Other | Source: Ambulatory Visit | Attending: Radiation Oncology | Admitting: Radiation Oncology

## 2013-09-11 VITALS — BP 142/72 | HR 87 | Temp 97.8°F | Resp 18 | Ht 69.0 in | Wt 211.9 lb

## 2013-09-11 DIAGNOSIS — IMO0001 Reserved for inherently not codable concepts without codable children: Secondary | ICD-10-CM | POA: Insufficient documentation

## 2013-09-11 DIAGNOSIS — I2510 Atherosclerotic heart disease of native coronary artery without angina pectoris: Secondary | ICD-10-CM

## 2013-09-11 DIAGNOSIS — M25569 Pain in unspecified knee: Secondary | ICD-10-CM | POA: Insufficient documentation

## 2013-09-11 DIAGNOSIS — C3410 Malignant neoplasm of upper lobe, unspecified bronchus or lung: Secondary | ICD-10-CM | POA: Insufficient documentation

## 2013-09-11 DIAGNOSIS — J449 Chronic obstructive pulmonary disease, unspecified: Secondary | ICD-10-CM

## 2013-09-11 DIAGNOSIS — C3491 Malignant neoplasm of unspecified part of right bronchus or lung: Secondary | ICD-10-CM

## 2013-09-11 DIAGNOSIS — I10 Essential (primary) hypertension: Secondary | ICD-10-CM

## 2013-09-11 NOTE — Progress Notes (Signed)
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 male who is seen out of the courtesy of Dr. Tyrone Sage for an opinion concerning radiation therapy as part of management of patient's recently diagnosed non-small cell lung cancer. Patient presented recently with increasing wheezing and cough as well as chest congestion. Patient was seen by his primary care physician and a chest x-ray was performed which revealed a suspicious area in the right hilar region. A subsequent chest CT scan confirmed a mass in the right hilar region. The patient was seen by Dr. Tyrone Sage and proceeded to undergo bronchoscopy with biopsy and EBUS with transbronchial needle aspiration. Patient was noted to have an endobronchial lesion in the R3 segmental bronchus.  fine-needle aspiration of the 4R node revealed malignant cells consistent with squamous cell carcinoma.  fine-needle aspiration of the 10th R node also revealed malignant cells consistent with squamous cell carcinoma. Staging workup as documented below showed no distant metastasis. PET scan confirmed a hypermetabolic right central upper lobe mass with involvement of the right hilar region. In addition mild postobstructive pneumonitis was noted in the peripheral right upper lobe. Given the above findings the patient was not felt to be a surgical candidate. He is now seen in radiation oncology as well as medical oncology for consideration for definitive treatment.Marland Kitchen  PREVIOUS RADIATION THERAPY: No  PAST MEDICAL HISTORY:  has a past medical history of Coronary artery disease;  Hypercholesterolemia; GERD (gastroesophageal reflux disease); Tubular adenoma (2003); COPD (chronic obstructive pulmonary disease); Diabetes mellitus; Arthritis; Allergic rhinitis; Cancer; and Hypertension.    PAST SURGICAL HISTORY: Past Surgical History  Procedure Laterality Date  . Left rotator cuff repair    . Left knee arthroscopy   x2    for meniscal tear  . Coronary stent placement  2001  . Right knee arthroscopy    . Bilateral carpal tunnel release    . Right shoulder arthroscopy    . Coronary artery bypass graft      occlusion of the stent and underwent one-vessel CABG in 2002  . Coronary angioplasty    . Colonoscopy  09/13/2011    Procedure: COLONOSCOPY;  Surgeon: Malissa Hippo, MD;  Location: [facility];  Service: Endoscopy;  Laterality: N/A;  . Cataract extraction w/phaco  09/25/2011    Procedure: CATARACT EXTRACTION PHACO AND INTRAOCULAR LENS PLACEMENT (IOC);  Surgeon: Gemma Payor;  Location: [facility] ORS;  Service: Ophthalmology;  Laterality: Left;  CDE:10.79  . Cataract extraction w/phaco  10/05/2011    Procedure: CATARACT EXTRACTION PHACO AND INTRAOCULAR LENS PLACEMENT (IOC);  Surgeon: Gemma Payor;  Location: [facility];  Service: Ophthalmology;  Laterality: Right;  CDE: 8.45  . Video bronchoscopy with endobronchial ultrasound N/A 09/08/2013    Procedure: VIDEO BRONCHOSCOPY WITH ENDOBRONCHIAL ULTRASOUND;  Surgeon: Delight Ovens, MD;  Location: [facility] OR;  Service: Thoracic;  Laterality: N/A;    FAMILY HISTORY: family history includes Cancer in his brother and father; Heart failure in his mother. There is no history of Anesthesia problems, Hypotension, Malignant hyperthermia, or Pseudochol deficiency.  SOCIAL HISTORY:  reports that he quit smoking about 17 years ago. His smoking  use included Cigarettes. He has a 60 pack-year smoking history. He does not have any smokeless tobacco history on file. He reports that he drinks about 7.0 ounces of alcohol per week. He reports that he does  not use illicit drugs.  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 Patient lives in the [place] area.  ALLERGIES: Review of patient's allergies indicates no known allergies.  MEDICATIONS:  Current Outpatient Prescriptions  Medication Sig Dispense Refill  . carboxymethylcellulose (REFRESH PLUS) 0.5 % SOLN Place 1 drop into both eyes 3 (three) times daily as needed (dry eyes).      . EXFORGE 5-160 MG per tablet TAKE ONE TABLET BY MOUTH ONCE DAILY.  30 tablet  9  . hydroxychloroquine (PLAQUENIL) 200 MG tablet Take 200 mg by mouth 2 (two) times daily.        . metFORMIN (GLUCOPHAGE) 500 MG tablet Take 500 mg by mouth 2 (two) times daily with a meal.        . metoprolol succinate (TOPROL-XL) 25 MG 24 hr tablet Take 25 mg by mouth daily.      Marland Kitchen omeprazole (PRILOSEC) 20 MG capsule Take 20 mg by mouth every morning.       . pravastatin (PRAVACHOL) 20 MG tablet Take 20 mg by mouth daily.      . sitaGLIPtin (JANUVIA) 100 MG tablet Take 100 mg by mouth every morning.        No current facility-administered medications for this encounter.    REVIEW OF SYSTEMS:  A 15 point review of systems is documented in the electronic medical record. This was obtained by the nursing staff. However, I reviewed this with the patient to discuss relevant findings and make appropriate changes. Patient has some congestion and cough. He denies any hemoptysis. He denies any new bony pain headaches dizziness or blurred vision. He denies any significant weight loss or appetite changes.   PHYSICAL EXAM: This is a very pleasant 72 year old gentleman in no acute distress. He is accompanied by his wife on evaluation today.  Examination of the neck and supraclavicular region reveals no evidence of adenopathy. The axillary areas are free of adenopathy. Examination of the lungs reveals them to be clear. The heart has a regular rhythm and rate. Examination of the chest reveals a scar along the sternum area from prior  CABG.  On neurological examination motor strength is 5 out of 5 in the proximal and distal muscle groups of the upper lower extremities.   KPS = 90  100 - Normal; no complaints; no evidence of disease. 90   - Able to carry on normal activity; minor signs or symptoms of disease. 80   - Normal activity with effort; some signs or symptoms of disease. 62   - Cares for self; unable to carry on normal activity or to do active work. 60   - Requires occasional assistance, but is able to care for most of his personal needs. 50   - Requires considerable assistance and frequent medical care. 40   - Disabled; requires special care and assistance. 30   - Severely disabled; hospital admission is indicated although death not imminent. 20   - Very sick; hospital admission necessary; active supportive treatment necessary. 10   - Moribund; fatal processes progressing rapidly. 0     - Dead  Karnofsky DA, Abelmann WH, Craver LS and Burchenal [facility] [phone]) The use of the nitrogen mustards in the palliative treatment of carcinoma: with particular reference to  bronchogenic carcinoma Cancer 1 634-56  LABORATORY DATA:  Lab Results  Component Value Date   WBC 12.0* 09/04/2013   HGB 14.3 09/04/2013   HCT 40.8 09/04/2013   MCV 89.9 09/04/2013   PLT 281 09/04/2013   Lab Results  Component Value Date   NA 135 09/04/2013   K 4.3 09/04/2013   CL 98 09/04/2013   CO2 28 09/04/2013   Lab Results  Component Value Date   ALT 10 09/04/2013   AST 13 09/04/2013   ALKPHOS 78 09/04/2013   BILITOT 0.7 09/04/2013     RADIOGRAPHY: Dg Chest 2 View Within Previous 72 Hours.  Films Obtained On Friday Are Acceptable For Monday And Tuesday Cases  09/08/2013   CLINICAL DATA:  Preoperative films for biopsy of lung mass.  EXAM: CHEST  2 VIEW  COMPARISON:  CT chest 08/19/2013. PET scan 09/02/2013.  FINDINGS: A posterior right upper lobe lung mass is again noted. Emphysematous changes are evident. No additional airspace disease is present. The  heart size is normal. The visualized soft tissues and bony thorax are unremarkable.  IMPRESSION: 1. Posterior right upper lobe lung mass. 2. Emphysema. 3. No acute cardiopulmonary disease.   Electronically Signed   By: Gennette Pac M.D.   On: 09/08/2013 07:15   Dg Chest 2 View  08/14/2013   CLINICAL DATA:  Cough, COPD, former smoker, coronary artery disease post CABG, hypertension, diabetes  EXAM: CHEST  2 VIEW  COMPARISON:  05/24/2012  FINDINGS: Normal heart size post median sternotomy.  Atherosclerotic calcification aorta.  Enlargement of right hilum new since previous exam with subsegmental atelectasis in right mid lung question right hilar mass versus adenopathy.  Underlying emphysematous and bronchitic changes.  Chronic interstitial changes at right base.  No pleural effusion or pneumothorax.  No acute osseous findings.  IMPRESSION: COPD changes with new right hilar enlargement question mass versus adenopathy ; evaluation by CT chest with IV contrast recommended.  Subsegmental atelectasis right mid lung with chronic right basilar interstitial disease.  Findings called to [facility] RN at Dr. Elease Hashimoto office on 08/04/2013 at 1228 hr.   Electronically Signed   By: Ulyses Southward M.D.   On: 08/14/2013 12:29   Ct Chest W Contrast  08/19/2013   *RADIOLOGY REPORT*  Clinical Data: Cough, abnormal chest x-ray.  CT CHEST WITH CONTRAST  Technique:  Multidetector CT imaging of the chest was performed following the standard protocol during bolus administration of intravenous contrast.  Contrast: 80mL OMNIPAQUE IOHEXOL 300 MG/ML  SOLN intravenously.  Comparison: Chest radiograph of August 14, 2013.  Findings: Irregular right hilar mass measuring 4.8 x 3.7 x 3.4 cm is noted concerning for malignancy.  It results in narrowing of the right upper lobe bronchus.  The thoracic aorta appears normal. Status post coronary artery bypass graft.  Pre tracheal lymph node measuring 1 cm is noted.  Precarinal lymph node measuring 14 x  10 mm is noted.  Visualized portion of upper abdomen appears normal. Mild emphysematous change is noted in the upper lobes bilaterally. Right upper lobe ill-defined opacity is noted which may represent postobstructive pneumonia or atelectasis.  Subpleural peri septal thickening is noted suggesting possible scarring or fibrosis.  IMPRESSION: 4.8 cm right hilar mass is noted consistent with malignancy, with probable associated right upper lobe postobstructive pneumonia or atelectasis.  Smaller lymph nodes are noted in the pretracheal and precarinal regions.   Original Report Authenticated By: Lupita Raider.,  M.D.   Mr Lodema Pilot Contrast  09/02/2013  CLINICAL DATA:  72 year old male with recent diagnosis of lung mass. Staging. Initial encounter.  BUN and creatinine were obtained on site at [facility] at  [address]  Results:  BUN 12 mg/dL,  Creatinine 0.9 mg/dL.  Estimated GFR 83.  EXAM: MRI HEAD WITHOUT AND WITH CONTRAST  TECHNIQUE: Multiplanar, multiecho pulse sequences of the brain and surrounding structures were obtained according to standard protocol without and with intravenous contrast  CONTRAST:  19mL MULTIHANCE GADOBENATE DIMEGLUMINE 529 MG/ML IV SOLN  COMPARISON:  Paranasal sinus CT 04/10/2006.  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 No abnormal enhancement identified. No midline shift, mass effect, or intracranial mass lesion identified.  No restricted diffusion to suggest acute infarction. No ventriculomegaly, extra-axial collection or acute intracranial hemorrhage. Cervicomedullary junction and pituitary are within normal limits. Negative visualized cervical spine. Normal bone marrow signal.  Minimal to mild for age scattered nonspecific cerebral white matter T2 and FLAIR hyperintensity. Normal visualized internal auditory structures. Trace left mastoid fluid.  Postoperative changes to the globes. Mild ethmoid sinus mucosal thickening.  Negative scalp soft tissues.  IMPRESSION: No acute or metastatic intracranial abnormality. Negative for age MRI appearance of the brain.   Electronically Signed   By: Augusto Gamble M.D.   On: 09/02/2013 15:51   Nm Pet Image Initial (pi) Skull Base To Thigh  09/02/2013   CLINICAL DATA:  Initial treatment strategy for right lung mass.  EXAM: NUCLEAR MEDICINE PET SKULL BASE TO THIGH  FASTING BLOOD GLUCOSE:  Value:  207 mg/dl  TECHNIQUE: 78.2 mCi N-56 FDG was injected intravenously. CT data was obtained and used for attenuation correction and anatomic localization only. (This was not acquired as a diagnostic CT examination.) Additional exam technical data entered on technologist worksheet.  COMPARISON:  Chest CT on 08/19/2013  FINDINGS: NECK  No hypermetabolic lymph nodes in the neck.  CHEST  Hypermetabolic mass is seen in the central right upper lobe which involves the right hilum. This has a maximum SUV of 18.1. Mild hypermetabolic postobstructive pneumonitis seen in the peripheral right upper lobe.  No hypermetabolic mediastinal or left hilar lymph nodes are identified.  ABDOMEN/PELVIS  No abnormal hypermetabolic activity within the liver, pancreas, adrenal glands, or spleen. No hypermetabolic lymph nodes in the abdomen or pelvis.  SKELETON  No focal hypermetabolic activity to suggest skeletal metastasis.  IMPRESSION: Hypermetabolic central right upper lobe mass with involvement of the right hilum. Mild postobstructive pneumonitis noted in the peripheral right upper lobe.  No evidence hypermetabolic mediastinal lymph nodes or extrathoracic metastatic disease.   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  The patient would be a good candidate for a definitive course of radiation along with radiosensitizing chemotherapy. I discussed the overall treatment course side effects and potential toxicities of radiation therapy  in this situation with the patient and his wife. The patient appears to understand and wishes to proceed with planned course of treatment.  PLAN: Simulation and planning on October 13 with treatments to begin October 20. Anticipate 6-1/2-7 weeks of radiation therapy as part of the patient's overall management.  I spent 40 minutes minutes face to face with the patient and more than 50% of that time was spent in counseling and/or coordination of care.   ------------------------------------------------ -----------------------------------  Billie Lade, MD, PhD

## 2013-09-11 NOTE — Progress Notes (Signed)
[address]       [place] [address]             [phone]                  David [name] [facility] Medical Record #[id number] Date of Birth: 27-Jan-1941  David Hampshire, MD David Ruths, MD  Chief Complaint:   PostOp Follow Up Visit 09/08/2013  PREOPERATIVE DIAGNOSES: Right upper lung mass, right hilar mass  suspicious for carcinoma of the lung.  POSTOPERATIVE DIAGNOSES: Right upper lung mass, right hilar mass  suspicious for carcinoma of the lung.  SURGICAL PROCEDURE: Bronchoscopy with biopsy and EBUS with  transbronchial needle aspiration.  SURGEON: Sheliah Plane, MD  Diagnosis FINE NEEDLE ASPIRATION, ENDOSCOPIC EBUS, 4R NODE, (SPECIMEN 3 OF 4, COLLECTED ON 09/08/13): MALIGNANT CELLS PRESENT SEE COMMENT COMMENT: THE MALIGNANT CELLS ARE CONSISTENT WITH SQUAMOUS CELL CARCINOMA.  History of Present Illness:      Doing well post op, returns today to Thoracic oncology clinic to discuss path and treatment options. No complications  From bx .        History  Smoking status  . Former Smoker -- 1.50 packs/day for 40 years  . Types: Cigarettes  . Quit date: 09/19/1995  Smokeless tobacco  . Not on file       No Known Allergies  Current Outpatient Prescriptions  Medication Sig Dispense Refill  . carboxymethylcellulose (REFRESH PLUS) 0.5 % SOLN Place 1 drop into both eyes 3 (three) times daily as needed (dry eyes).      . EXFORGE 5-160 MG per tablet TAKE ONE TABLET BY MOUTH ONCE DAILY.  30 tablet  9  . hydroxychloroquine (PLAQUENIL) 200 MG tablet Take 200 mg by mouth 2 (two) times daily.        . metFORMIN (GLUCOPHAGE) 500 MG tablet Take 500 mg by mouth 2 (two) times daily with a meal.        . metoprolol succinate (TOPROL-XL) 25 MG 24 hr tablet Take 25 mg by mouth daily.      Marland Kitchen omeprazole (PRILOSEC) 20 MG capsule Take 20 mg by mouth every morning.       . pravastatin (PRAVACHOL) 20 MG tablet Take 20 mg by mouth daily.      .  sitaGLIPtin (JANUVIA) 100 MG tablet Take 100 mg by mouth every morning.        No current facility-administered medications for this visit.       Physical Exam: There were no vitals taken for this visit.  General appearance: alert and cooperative Neurologic: intact Heart: regular rate and rhythm, S1, S2 normal, no murmur, click, rub or gallop Lungs: clear to auscultation bilaterally and normal percussion bilaterally Abdomen: soft, non-tender; bowel sounds normal; no masses,  no organomegaly Extremities: extremities normal, atraumatic, no cyanosis or edema and Homans sign is negative, no sign of DVT   Diagnostic Studies & Laboratory data:         Recent Radiology Findings:   Recent Labs: Lab Results  Component Value Date   WBC 12.0* 09/04/2013   HGB 14.3 09/04/2013   HCT 40.8 09/04/2013   PLT 281 09/04/2013   GLUCOSE 223* 09/04/2013   ALT 10 09/04/2013   AST 13 09/04/2013   NA 135 09/04/2013   K 4.3 09/04/2013   CL 98 09/04/2013   CREATININE 0.82 09/04/2013   BUN 10 09/04/2013   CO2 28 09/04/2013   INR 1.04 09/04/2013  Assessment / Plan:   Stable following rt lung bx Found to be  pT2a,p N2,cMo Stage IIIa Ca of the lung Discussed treatment with radiation and Chemo at [facility] conference and discussed with pateint and wife To seen Dr Arbutus Ped and Innards today   Anvi Mangal B 09/11/2013 4:34 PM

## 2013-09-11 NOTE — Progress Notes (Signed)
[facility] Clinical Social Work  Clinical Social Work met with patient and patient's spouse to offer support and assess for psychosocial needs.  Medical oncologist reviewed patient's diagnosis and treatment plan with patient and spouse.  David [name] was agreeable to plan and may possibly seek second opinion.  The patients shared he is eager to begin treatment as soon as possible.  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  He reports enjoying work and hopes to continue this routine. CSW briefly reviewed CSW and support services at [facility].     Kathrin Penner, MSW, LCSW Clinical Social Worker [facility] [phone]

## 2013-09-11 NOTE — Progress Notes (Signed)
[facility] Telephone:([phone]) [phone]   Fax:(336) [phone] Multidisciplinary thoracic oncology clinic (MTOC)   CONSULT NOTE  REFERRING PHYSICIAN: Dr. Sheliah Plane  REASON FOR CONSULTATION:  72 years old white male recently diagnosed with lung cancer.  HPI David [name] is a 72 y.o. male was past medical history significant for multiple medical problems including history of car disease status post CABG 13 years ago, hypertension, dyslipidemia, GERD, COPD, diabetes mellitus, arthritis and history of smoking and but quit in 1996. The patient was seen by his primary care physician for routine annual evaluation and complained of increasing cough and shortness breath as well as wheezes. Chest x-ray was performed on 08/14/2013 and it showed COPD changes with new right hilar enlargement questionable mass versus adenopathy. This was followed by CT scan of the chest with contrast on 08/19/2013 and it showed irregular right hilar mass measuring 4.8 x 3.7 x 3.4 cm is noted concerning for malignancy. It results in narrowing of the  right upper lobe bronchus. The thoracic aorta appears normal. Status post coronary artery bypass graft. Pre tracheal lymph node  measuring 1 cm is noted. Precarinal lymph node measuring 14 x 10 mm is noted. Visualized portion of upper abdomen appears normal. The patient was referred to Dr. Tyrone Sage and he had MRI of the brain performed on 09/02/2013 that showed no evidence for metastatic disease to the brain. He also had a PET scan on 09/02/2013 and it showed Hypermetabolic central right upper lobe mass with involvement of the right hilum. Mild postobstructive pneumonitis noted in the peripheral right upper lobe. No evidence hypermetabolic mediastinal lymph nodes or extrathoracic metastatic disease. On 09/08/2013 the patient underwent bronchoscopy with biopsy and EBUS with transbronchial needle aspiration under the care of Dr. Tyrone Sage.  The final cytology  (Accession: [id number]) the fine needle aspiration, endoscopic EBUS of the 4R lymph node showed malignant cells consistent with squamous cell carcinoma. Dr. Tyrone Sage kindly referred the patient to me today for further evaluation and recommendation regarding treatment of his condition. When seen today the patient continues to complain of hoarseness of his voice after the bronchoscopy as well as wheezes and cough productive of clear sputum but no significant shortness of breath. The patient denied having any significant weight loss or night sweats. He has no nausea or vomiting. He denied having any significant visual changes or headache. His family history significant for several family members with malignancy including his father with lung cancer, brother also had lung cancer, nephew with sarcoma and a niece with leukemia.  The patient is married and has 2 children a son and a daughter. He was accompanied by his wife David [name]. The patient lives in [place] and he works as an Advertising account planner. He has a history of smoking one pack per day for around 35 years and quit in 1996. He also drink glass of wine every day. He has no history of drug abuse. HPI  Past Medical History  Diagnosis Date  . Coronary artery disease     s/p PCI 2000 and single vessel CABG 2001  . Hypercholesterolemia   . GERD (gastroesophageal reflux disease)   . Tubular adenoma 2003  . COPD (chronic obstructive pulmonary disease)   . Diabetes mellitus   . Arthritis   . Allergic rhinitis   . Cancer     lung mass  . Hypertension     Dr Excell Seltzer    Past Surgical History  Procedure Laterality Date  . Left rotator cuff repair    .  Left knee arthroscopy   x2    for meniscal tear  . Coronary stent placement  2001  . Right knee arthroscopy    . Bilateral carpal tunnel release    . Right shoulder arthroscopy    . Coronary artery bypass graft      occlusion of the stent and underwent one-vessel CABG in 2002  . Coronary  angioplasty    . Colonoscopy  09/13/2011    Procedure: COLONOSCOPY;  Surgeon: Malissa Hippo, MD;  Location: [facility];  Service: Endoscopy;  Laterality: N/A;  . Cataract extraction w/phaco  09/25/2011    Procedure: CATARACT EXTRACTION PHACO AND INTRAOCULAR LENS PLACEMENT (IOC);  Surgeon: Gemma Payor;  Location: [facility] ORS;  Service: Ophthalmology;  Laterality: Left;  CDE:10.79  . Cataract extraction w/phaco  10/05/2011    Procedure: CATARACT EXTRACTION PHACO AND INTRAOCULAR LENS PLACEMENT (IOC);  Surgeon: Gemma Payor;  Location: [facility];  Service: Ophthalmology;  Laterality: Right;  CDE: 8.45  . Video bronchoscopy with endobronchial ultrasound N/A 09/08/2013    Procedure: VIDEO BRONCHOSCOPY WITH ENDOBRONCHIAL ULTRASOUND;  Surgeon: Delight Ovens, MD;  Location: [facility] OR;  Service: Thoracic;  Laterality: N/A;    Family History  Problem Relation Age of Onset  . Heart failure Mother   . Cancer Father     lung canger  . Cancer Brother     lung cancer  . Anesthesia problems Neg Hx   . Hypotension Neg Hx   . Malignant hyperthermia Neg Hx   . Pseudochol deficiency Neg Hx     Social History History  Substance Use Topics  . Smoking status: Former Smoker -- 1.50 packs/day for 40 years    Types: Cigarettes    Quit date: 09/19/1995  . Smokeless tobacco: Not on file  . Alcohol Use: 7.0 oz/week    14 drink(s) per week     Comment: scotch 1 shot daily    No Known Allergies  Current Outpatient Prescriptions  Medication Sig Dispense Refill  . carboxymethylcellulose (REFRESH PLUS) 0.5 % SOLN Place 1 drop into both eyes 3 (three) times daily as needed (dry eyes).      . EXFORGE 5-160 MG per tablet TAKE ONE TABLET BY MOUTH ONCE DAILY.  30 tablet  9  . hydroxychloroquine (PLAQUENIL) 200 MG tablet Take 200 mg by mouth 2 (two) times daily.        . metFORMIN (GLUCOPHAGE) 500 MG tablet Take 500 mg by mouth 2 (two) times daily with a meal.        . metoprolol succinate (TOPROL-XL) 25 MG 24 hr tablet  Take 25 mg by mouth daily.      Marland Kitchen omeprazole (PRILOSEC) 20 MG capsule Take 20 mg by mouth every morning.       . pravastatin (PRAVACHOL) 20 MG tablet Take 20 mg by mouth daily.      . sitaGLIPtin (JANUVIA) 100 MG tablet Take 100 mg by mouth every morning.        No current facility-administered medications for this visit.    Review of Systems  Constitutional: negative Eyes: negative Ears, nose, mouth, throat, and face: negative Respiratory: positive for cough, sputum and wheezing Cardiovascular: negative Gastrointestinal: negative Genitourinary:negative Integument/breast: negative Hematologic/lymphatic: negative Musculoskeletal:negative Neurological: negative Behavioral/Psych: negative Endocrine: negative Allergic/Immunologic: negative  Physical Exam  UJW:JXBJY, healthy, no distress, well nourished and well developed SKIN: skin color, texture, turgor are normal, no rashes or significant lesions HEAD: Normocephalic, No masses, lesions, tenderness or abnormalities EYES: normal, PERRLA EARS: External  ears normal OROPHARYNX:no exudate, no erythema and lips, buccal mucosa, and tongue normal  NECK: supple, no adenopathy, thyroid normal size, non-tender, without nodularity LYMPH:  no palpable lymphadenopathy, no hepatosplenomegaly LUNGS: clear to auscultation , and palpation HEART: regular rate & rhythm, no murmurs and no gallops ABDOMEN:abdomen soft, non-tender, normal bowel sounds and no masses or organomegaly BACK: Back symmetric, no curvature., No CVA tenderness EXTREMITIES:no joint deformities, effusion, or inflammation, no edema, no skin discoloration, no clubbing  NEURO: alert & oriented x 3 with fluent speech, no focal motor/sensory deficits, gait normal  PERFORMANCE STATUS: ECOG 1  LABORATORY DATA: Lab Results  Component Value Date   WBC 12.0* 09/04/2013   HGB 14.3 09/04/2013   HCT 40.8 09/04/2013   MCV 89.9 09/04/2013   PLT 281 09/04/2013      Chemistry        Component Value Date/Time   NA 135 09/04/2013 1419   K 4.3 09/04/2013 1419   CL 98 09/04/2013 1419   CO2 28 09/04/2013 1419   BUN 10 09/04/2013 1419   CREATININE 0.82 09/04/2013 1419      Component Value Date/Time   CALCIUM 9.4 09/04/2013 1419   ALKPHOS 78 09/04/2013 1419   AST 13 09/04/2013 1419   ALT 10 09/04/2013 1419   BILITOT 0.7 09/04/2013 1419       RADIOGRAPHIC STUDIES: Dg Chest 2 View Within Previous 72 Hours.  Films Obtained On Friday Are Acceptable For Monday And Tuesday Cases  09/08/2013   CLINICAL DATA:  Preoperative films for biopsy of lung mass.  EXAM: CHEST  2 VIEW  COMPARISON:  CT chest 08/19/2013. PET scan 09/02/2013.  FINDINGS: A posterior right upper lobe lung mass is again noted. Emphysematous changes are evident. No additional airspace disease is present. The heart size is normal. The visualized soft tissues and bony thorax are unremarkable.  IMPRESSION: 1. Posterior right upper lobe lung mass. 2. Emphysema. 3. No acute cardiopulmonary disease.   Electronically Signed   By: Gennette Pac M.D.   On: 09/08/2013 07:15   Dg Chest 2 View  08/14/2013   CLINICAL DATA:  Cough, COPD, former smoker, coronary artery disease post CABG, hypertension, diabetes  EXAM: CHEST  2 VIEW  COMPARISON:  05/24/2012  FINDINGS: Normal heart size post median sternotomy.  Atherosclerotic calcification aorta.  Enlargement of right hilum new since previous exam with subsegmental atelectasis in right mid lung question right hilar mass versus adenopathy.  Underlying emphysematous and bronchitic changes.  Chronic interstitial changes at right base.  No pleural effusion or pneumothorax.  No acute osseous findings.  IMPRESSION: COPD changes with new right hilar enlargement question mass versus adenopathy ; evaluation by CT chest with IV contrast recommended.  Subsegmental atelectasis right mid lung with chronic right basilar interstitial disease.  Findings called to [facility] RN at Dr. Elease Hashimoto office on 08/04/2013 at  1228 hr.   Electronically Signed   By: Ulyses Southward M.D.   On: 08/14/2013 12:29   Ct Chest W Contrast  08/19/2013   *RADIOLOGY REPORT*  Clinical Data: Cough, abnormal chest x-ray.  CT CHEST WITH CONTRAST  Technique:  Multidetector CT imaging of the chest was performed following the standard protocol during bolus administration of intravenous contrast.  Contrast: 80mL OMNIPAQUE IOHEXOL 300 MG/ML  SOLN intravenously.  Comparison: Chest radiograph of August 14, 2013.  Findings: Irregular right hilar mass measuring 4.8 x 3.7 x 3.4 cm is noted concerning for malignancy.  It results in narrowing of the right upper lobe bronchus.  The thoracic aorta appears normal. Status post coronary artery bypass graft.  Pre tracheal lymph node measuring 1 cm is noted.  Precarinal lymph node measuring 14 x 10 mm is noted.  Visualized portion of upper abdomen appears normal. Mild emphysematous change is noted in the upper lobes bilaterally. Right upper lobe ill-defined opacity is noted which may represent postobstructive pneumonia or atelectasis.  Subpleural peri septal thickening is noted suggesting possible scarring or fibrosis.  IMPRESSION: 4.8 cm right hilar mass is noted consistent with malignancy, with probable associated right upper lobe postobstructive pneumonia or atelectasis.  Smaller lymph nodes are noted in the pretracheal and precarinal regions.   Original Report Authenticated By: Lupita Raider.,  M.D.   Mr Laqueta Jean Wo Contrast  09/02/2013   CLINICAL DATA:  72 year old male with recent diagnosis of lung mass. Staging. Initial encounter.  BUN and creatinine were obtained on site at [facility] at  [address]  Results:  BUN 12 mg/dL,  Creatinine 0.9 mg/dL.  Estimated GFR 83.  EXAM: MRI HEAD WITHOUT AND WITH CONTRAST  TECHNIQUE: Multiplanar, multiecho pulse sequences of the brain and surrounding structures were obtained according to standard protocol without and with intravenous contrast  CONTRAST:   19mL MULTIHANCE GADOBENATE DIMEGLUMINE 529 MG/ML IV SOLN  COMPARISON:  Paranasal sinus CT 04/10/2006.  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 No abnormal enhancement identified. No midline shift, mass effect, or intracranial mass lesion identified.  No restricted diffusion to suggest acute infarction. No ventriculomegaly, extra-axial collection or acute intracranial hemorrhage. Cervicomedullary junction and pituitary are within normal limits. Negative visualized cervical spine. Normal bone marrow signal.  Minimal to mild for age scattered nonspecific cerebral white matter T2 and FLAIR hyperintensity. Normal visualized internal auditory structures. Trace left mastoid fluid.  Postoperative changes to the globes. Mild ethmoid sinus mucosal thickening. Negative scalp soft tissues.  IMPRESSION: No acute or metastatic intracranial abnormality. Negative for age MRI appearance of the brain.   Electronically Signed   By: Augusto Gamble M.D.   On: 09/02/2013 15:51   Nm Pet Image Initial (pi) Skull Base To Thigh  09/02/2013   CLINICAL DATA:  Initial treatment strategy for right lung mass.  EXAM: NUCLEAR MEDICINE PET SKULL BASE TO THIGH  FASTING BLOOD GLUCOSE:  Value:  207 mg/dl  TECHNIQUE: 40.9 mCi W-11 FDG was injected intravenously. CT data was obtained and used for attenuation correction and anatomic localization only. (This was not acquired as a diagnostic CT examination.) Additional exam technical data entered on technologist worksheet.  COMPARISON:  Chest CT on 08/19/2013  FINDINGS: NECK  No hypermetabolic lymph nodes in the neck.  CHEST  Hypermetabolic mass is seen in the central right upper lobe which involves the right hilum. This has a maximum SUV of 18.1. Mild hypermetabolic postobstructive pneumonitis seen in the peripheral right upper lobe.  No hypermetabolic mediastinal or left hilar lymph nodes are identified.  ABDOMEN/PELVIS  No abnormal hypermetabolic activity  within the liver, pancreas, adrenal glands, or spleen. No hypermetabolic lymph nodes in the abdomen or pelvis.  SKELETON  No focal hypermetabolic activity to suggest skeletal metastasis.  IMPRESSION: Hypermetabolic central right upper lobe mass with involvement of the right hilum. Mild postobstructive pneumonitis noted in the peripheral right upper lobe.  No evidence hypermetabolic mediastinal lymph nodes or extrathoracic metastatic disease.   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   PLAN: I have a lengthy discussion with the patient and his wife today about his disease stage, prognosis and treatment options. The patient is not currently a surgical candidate for resection because of the N2 disease. I recommended for the patient treatment with a course of concurrent chemoradiation with weekly carboplatin for AUC of 2 and paclitaxel 45 mg/M2 for pain.6-7 weeks depending on the dose of radiation. This would be followed by restaging scan and reevaluation for surgical resection if he has resolution of the N2 disease. I discussed with the patient the adverse effect of the chemotherapy including but not limited to alopecia, myelosuppression, nausea and vomiting, peripheral neuropathy, liver or renal dysfunction. The patient will be seen later today by Dr. Roselind Messier for evaluation and discussion of the radiotherapy option. The patient and his wife are interested in proceeding with the treatment but did have a lot of pressure from several family members to seek a second opinion before proceeding with the treatment. I will arrange for the patient to see either Dr. Bartholomew Boards or Dr. Janann August at [facility] for a second opinion. I will tentatively arrange for the patient to start his concurrent chemoradiation on 09/22/2013 his final decision is to proceed with concurrent chemoradiation as planned. The patient would come back for followup visit in 3 weeks for evaluation and management any adverse effect of his treatment. He was advised to call immediately if he has any concerning symptoms in the interval. The patient would have a chemotherapy education class before starting the first dose of his systemic chemotherapy.  The patient voices understanding of current disease status and treatment options and is in agreement with the current care plan.  All questions were answered. The patient knows to call the clinic with any problems, questions or concerns. We can certainly see the patient much sooner if necessary.  Thank you so much for allowing me to participate in the care of David [name]. I will continue to follow up the patient with you and assist in his care.  I spent 50 minutes counseling the patient face to face. The total time spent in the appointment was 70 minutes.  Kristopher Delk K. 09/11/2013, 4:24 PM

## 2013-09-11 NOTE — Telephone Encounter (Signed)
gv pt appt schedule for October thru December. Message to chemo scheduler to add 1st tx for 10/20. Pt aware he will be contacted re appt for 10/20.

## 2013-09-12 ENCOUNTER — Telehealth: Payer: Self-pay | Admitting: *Deleted

## 2013-09-12 ENCOUNTER — Encounter: Payer: Self-pay | Admitting: *Deleted

## 2013-09-12 NOTE — Progress Notes (Signed)
   Thoracic Treatment Summary Name:Micajah T Yearby Date:09/12/2013 DOB:June 03, 1941 Your Medical Team Medical Oncologist: Dr. Arbutus Ped Radiation Oncologist: Dr. Roselind Messier Surgeon: Dr. Tyrone Sage Type and Stage of Lung Cancer Non-Small Cell Carcinoma:  Squamous Cell Clinical Stage: IIB/IIIA   Clinical stage is based on radiology exams.  Pathological stage will be determined after surgery.  Staging is based on the size of the tumor, involvement of lymph nodes or not, and whether or not the cancer center has spread. Recommendations Recommendations: Concurrent chemoradiation therapy  These recommendations are based on information available as of today's consult.  This is subject to change depending further testing or exams. Next Steps Next Step: 1. Radiation appointment for [facility] 09/15/13 2. Medical Oncology appointment for 1st chemo 09/22/13 3. Family is thinking about second opinion and will call if they choose to do this  Barriers to Care What do you perceive as a potential barrier that may prevent you from receiving your treatment plan? Nothing perceived at this time Resources: Lung cancer booklet from [employer] given and explained [employer] www.cacner.org 415-627-3319 Cancer Care www.cancercare.org Lung Cancer Alliance www.lungcanceralliance.org Questions Willette Pa, RN BSN Thoracic Oncology Nurse Navigator at [phone]  Annabelle Harman is a nurse navigator that is available to assist you through your cancer journey.  She can answer your questions and/or provide resources regarding your treatment plan, emotional support, or financial concerns.

## 2013-09-12 NOTE — Telephone Encounter (Signed)
Family stated they did not want to seek another opinion at this time.

## 2013-09-15 ENCOUNTER — Ambulatory Visit
Admission: RE | Admit: 2013-09-15 | Discharge: 2013-09-15 | Disposition: A | Discharge status: Home / Self Care | Payer: Medicare Other | Source: Ambulatory Visit | Attending: Radiation Oncology | Admitting: Radiation Oncology

## 2013-09-15 DIAGNOSIS — Z79899 Other long term (current) drug therapy: Secondary | ICD-10-CM | POA: Insufficient documentation

## 2013-09-15 DIAGNOSIS — R5381 Other malaise: Secondary | ICD-10-CM | POA: Insufficient documentation

## 2013-09-15 DIAGNOSIS — C3410 Malignant neoplasm of upper lobe, unspecified bronchus or lung: Secondary | ICD-10-CM | POA: Insufficient documentation

## 2013-09-15 DIAGNOSIS — Z510 Encounter for antineoplastic radiation therapy: Secondary | ICD-10-CM | POA: Insufficient documentation

## 2013-09-15 DIAGNOSIS — L988 Other specified disorders of the skin and subcutaneous tissue: Secondary | ICD-10-CM | POA: Insufficient documentation

## 2013-09-15 DIAGNOSIS — Y842 Radiological procedure and radiotherapy as the cause of abnormal reaction of the patient, or of later complication, without mention of misadventure at the time of the procedure: Secondary | ICD-10-CM | POA: Insufficient documentation

## 2013-09-16 ENCOUNTER — Encounter: Payer: Self-pay | Admitting: *Deleted

## 2013-09-16 ENCOUNTER — Telehealth: Payer: Self-pay | Admitting: Internal Medicine

## 2013-09-16 ENCOUNTER — Other Ambulatory Visit: Payer: Medicare Other

## 2013-09-16 NOTE — Telephone Encounter (Signed)
Per MM ok to start tx 10/21 (tuesday) and then back to Mondays. Only available slot on 10/20 is 7am and pt needs lb. Pt given schedule for October thru December while in [place] today.

## 2013-09-16 NOTE — Progress Notes (Signed)
  Radiation Oncology         [phone] ________________________________  Name: David [name] MRN: [id number]  Date: 09/15/2013  DOB: 22-Nov-1941  SIMULATION AND TREATMENT PLANNING NOTE  DIAGNOSIS:  Stage IIIa (T2a, N2, M0) squamous cell carcinoma of the lung   NARRATIVE:  The patient was brought to the CT Simulation planning suite.  Identity was confirmed.  All relevant records and images related to the planned course of therapy were reviewed.  The patient freely provided informed written consent to proceed with treatment after reviewing the details related to the planned course of therapy. The consent form was witnessed and verified by the simulation staff.  Then, the patient was set-up in a stable reproducible  supine position for radiation therapy.  CT images were obtained.  Surface markings were placed.  The CT images were loaded into the planning software.  Then the target and avoidance structures were contoured.  Treatment planning then occurred.  The radiation prescription was entered and confirmed.  Then, I designed and supervised the construction of a total of 1 medically necessary complex treatment devices.  I have requested : 3D Simulation  I have requested a DVH of the following structures: GTV, PTV, lungs, heart, esophagus.  I have ordered:dose calc.  PLAN:  The patient will receive 63 Gy in 35 fractions.  ________________________________  Special treatment procedure note  The patient will be receiving radiosensitizing chemotherapy throughout his course of chest radiation treatments. Given the increased potential for toxicities as well as the necessity for close monitoring of the patient and bloodwork, this constitutes a special treatment procedure. -----------------------------------  Billie Lade, PhD, MD

## 2013-09-22 ENCOUNTER — Ambulatory Visit: Payer: Medicare Other

## 2013-09-22 ENCOUNTER — Ambulatory Visit
Admission: RE | Admit: 2013-09-22 | Discharge: 2013-09-22 | Disposition: A | Discharge status: Home / Self Care | Payer: Medicare Other | Source: Ambulatory Visit | Attending: Radiation Oncology | Admitting: Radiation Oncology

## 2013-09-22 NOTE — Progress Notes (Signed)
  Radiation Oncology         [phone] ________________________________  Name: David [name] MRN: [id number]  Date: 09/22/2013  DOB: 11-18-1941  Simulation Verification Note  Status: outpatient  NARRATIVE: The patient was brought to the treatment unit and placed in the planned treatment position. The clinical setup was verified. Then port films were obtained and uploaded to the radiation oncology medical record software.  The treatment beams were carefully compared against the planned radiation fields. The position location and shape of the radiation fields was reviewed. They targeted volume of tissue appears to be appropriately covered by the radiation beams. Organs at risk appear to be excluded as planned.  Based on my personal review, I approved the simulation verification. The patient's treatment will proceed as planned.  -----------------------------------  Billie Lade, PhD, MD

## 2013-09-23 ENCOUNTER — Ambulatory Visit (HOSPITAL_BASED_OUTPATIENT_CLINIC_OR_DEPARTMENT_OTHER): Payer: Medicare Other

## 2013-09-23 ENCOUNTER — Other Ambulatory Visit (HOSPITAL_BASED_OUTPATIENT_CLINIC_OR_DEPARTMENT_OTHER): Payer: Medicare Other | Admitting: Lab

## 2013-09-23 ENCOUNTER — Ambulatory Visit
Admission: RE | Admit: 2013-09-23 | Discharge: 2013-09-23 | Disposition: A | Discharge status: Home / Self Care | Payer: Medicare Other | Source: Ambulatory Visit | Attending: Radiation Oncology | Admitting: Radiation Oncology

## 2013-09-23 DIAGNOSIS — C3491 Malignant neoplasm of unspecified part of right bronchus or lung: Secondary | ICD-10-CM

## 2013-09-23 DIAGNOSIS — Z5111 Encounter for antineoplastic chemotherapy: Secondary | ICD-10-CM

## 2013-09-23 DIAGNOSIS — C3410 Malignant neoplasm of upper lobe, unspecified bronchus or lung: Secondary | ICD-10-CM

## 2013-09-23 LAB — COMPREHENSIVE METABOLIC PANEL (CC13)
Anion Gap: 13 mEq/L — ABNORMAL HIGH (ref 3–11)
BUN: 10.5 mg/dL (ref 7.0–26.0)
CO2: 23 mEq/L (ref 22–29)
Calcium: 10 mg/dL (ref 8.4–10.4)
Chloride: 102 mEq/L (ref 98–109)
Creatinine: 0.9 mg/dL (ref 0.7–1.3)
Glucose: 203 mg/dl — ABNORMAL HIGH (ref 70–140)
Total Bilirubin: 0.59 mg/dL (ref 0.20–1.20)
Total Protein: 7.5 g/dL (ref 6.4–8.3)

## 2013-09-23 LAB — CBC WITH DIFFERENTIAL/PLATELET
Basophils Absolute: 0 10*3/uL (ref 0.0–0.1)
EOS%: 0.5 % (ref 0.0–7.0)
Eosinophils Absolute: 0.1 10*3/uL (ref 0.0–0.5)
HGB: 14.5 g/dL (ref 13.0–17.1)
LYMPH%: 10.8 % — ABNORMAL LOW (ref 14.0–49.0)
MCH: 30.1 pg (ref 27.2–33.4)
MCV: 88.8 fL (ref 79.3–98.0)
MONO%: 8.4 % (ref 0.0–14.0)
NEUT#: 8.6 10*3/uL — ABNORMAL HIGH (ref 1.5–6.5)
NEUT%: 80 % — ABNORMAL HIGH (ref 39.0–75.0)
Platelets: 314 10*3/uL (ref 140–400)

## 2013-09-23 MED ORDER — RADIAPLEXRX EX GEL
Freq: Once | CUTANEOUS | Status: AC
  Administered 2013-09-23: 17:00:00 via TOPICAL

## 2013-09-23 MED ORDER — SODIUM CHLORIDE 0.9 % IV SOLN
231.6000 mg | Freq: Once | INTRAVENOUS | Status: AC
  Administered 2013-09-23: 230 mg via INTRAVENOUS
  Filled 2013-09-23: qty 23

## 2013-09-23 MED ORDER — PACLITAXEL CHEMO INJECTION 300 MG/50ML
45.0000 mg/m2 | Freq: Once | INTRAVENOUS | Status: AC
  Administered 2013-09-23: 96 mg via INTRAVENOUS
  Filled 2013-09-23: qty 16

## 2013-09-23 MED ORDER — DIPHENHYDRAMINE HCL 50 MG/ML IJ SOLN
50.0000 mg | Freq: Once | INTRAMUSCULAR | Status: AC
  Administered 2013-09-23: 50 mg via INTRAVENOUS

## 2013-09-23 MED ORDER — SUCRALFATE 1 GM/10ML PO SUSP: 1.0000 g | Freq: Four times a day (QID) | ORAL | Status: DC

## 2013-09-23 MED ORDER — SODIUM CHLORIDE 0.9 % IV SOLN
Freq: Once | INTRAVENOUS | Status: AC
  Administered 2013-09-23: 12:00:00 via INTRAVENOUS

## 2013-09-23 MED ORDER — FAMOTIDINE IN NACL 20-0.9 MG/50ML-% IV SOLN
20.0000 mg | Freq: Once | INTRAVENOUS | Status: AC
  Administered 2013-09-23: 20 mg via INTRAVENOUS

## 2013-09-23 MED ORDER — DIPHENHYDRAMINE HCL 50 MG/ML IJ SOLN
INTRAMUSCULAR | Status: AC
  Filled 2013-09-23: qty 1

## 2013-09-23 MED ORDER — FAMOTIDINE IN NACL 20-0.9 MG/50ML-% IV SOLN
INTRAVENOUS | Status: AC
  Filled 2013-09-23: qty 50

## 2013-09-23 MED ORDER — DEXAMETHASONE SODIUM PHOSPHATE 20 MG/5ML IJ SOLN
20.0000 mg | Freq: Once | INTRAMUSCULAR | Status: AC
  Administered 2013-09-23: 20 mg via INTRAVENOUS

## 2013-09-23 MED ORDER — DEXAMETHASONE SODIUM PHOSPHATE 20 MG/5ML IJ SOLN
INTRAMUSCULAR | Status: AC
  Filled 2013-09-23: qty 5

## 2013-09-23 MED ORDER — ONDANSETRON 16 MG/50ML IVPB (CHCC)
INTRAVENOUS | Status: AC
  Filled 2013-09-23: qty 16

## 2013-09-23 MED ORDER — PROCHLORPERAZINE MALEATE 10 MG PO TABS: 10.0000 mg | ORAL_TABLET | Freq: Four times a day (QID) | ORAL | Status: DC | PRN

## 2013-09-23 MED ORDER — ONDANSETRON 16 MG/50ML IVPB (CHCC)
16.0000 mg | Freq: Once | INTRAVENOUS | Status: AC
  Administered 2013-09-23: 16 mg via INTRAVENOUS

## 2013-09-23 NOTE — Progress Notes (Signed)
[facility]    Radiation Oncology [address] [place]     Maryln Gottron, M.D. [place], [place] [address]               Billie Lade, M.D., Ph.D. Phone: [phone]      Molli Hazard A. Kathrynn Running, M.D. Fax: [phone]      Radene Gunning, M.D., Ph.D.         Lurline Hare, M.D.         [name] Jack, M.D Weekly Treatment Management Note  Name: David [name]     MRN: [id number]        CSN: [id number] Date: 09/23/2013      DOB: June 08, 1941  CC: [name], MD         [place]    Status: Outpatient  Diagnosis: The encounter diagnosis was Malignant neoplasm of upper lobe, bronchus or lung, right.  Current Dose: 1.8 Gy  Current Fraction: 1  Planned Dose: 63 Gy  Narrative: David [name] was seen today for weekly treatment management. The chart was checked and CBCT  were reviewed.  He is tolerating the treatments well thus far. He did receive chemotherapy earlier today and tolerated this well.  Review of patient's allergies indicates no known allergies.  Current Outpatient Prescriptions  Medication Sig Dispense Refill  . carboxymethylcellulose (REFRESH PLUS) 0.5 % SOLN Place 1 drop into both eyes 3 (three) times daily as needed (dry eyes).      Marland Kitchen dextromethorphan (DELSYM) 30 MG/5ML liquid Take 60 mg by mouth as needed for cough.      . EXFORGE 5-160 MG per tablet TAKE ONE TABLET BY MOUTH ONCE DAILY.  30 tablet  9  . hyaluronate sodium (RADIAPLEXRX) GEL Apply 1 application topically 2 (two) times daily.      . hydroxychloroquine (PLAQUENIL) 200 MG tablet Take 200 mg by mouth 2 (two) times daily.        . metFORMIN (GLUCOPHAGE) 500 MG tablet Take 500 mg by mouth 2 (two) times daily with a meal.        . metoprolol succinate (TOPROL-XL) 25 MG 24 hr tablet Take 25 mg by mouth daily.      . non-metallic deodorant Thornton Papas) MISC Apply 1 application topically daily as needed.      Marland Kitchen omeprazole (PRILOSEC) 20 MG capsule Take 20 mg by mouth every morning.       .  pravastatin (PRAVACHOL) 20 MG tablet Take 20 mg by mouth daily.      . sitaGLIPtin (JANUVIA) 100 MG tablet Take 100 mg by mouth every morning.       . prochlorperazine (COMPAZINE) 10 MG tablet Take 1 tablet (10 mg total) by mouth every 6 (six) hours as needed.  30 tablet  1   No current facility-administered medications for this encounter.   Labs:  Lab Results  Component Value Date   WBC 10.7* 09/23/2013   HGB 14.5 09/23/2013   HCT 42.7 09/23/2013   MCV 88.8 09/23/2013   PLT 314 09/23/2013   Lab Results  Component Value Date   CREATININE 0.9 09/23/2013   BUN 10.5 09/23/2013   NA 137 09/23/2013   K 4.3 09/23/2013   CL 98 09/04/2013   CO2 23 09/23/2013   Lab Results  Component Value Date   ALT 9 09/23/2013   AST 14 09/23/2013   BILITOT 0.59 09/23/2013    Physical Examination:  height is 5\' 9"  (1.753 m) and weight is 207 lb 14.4  oz (94.303 kg). His temperature is 98.4 F (36.9 C). His blood pressure is 135/75 and his pulse is 98. His oxygen saturation is 96%.    Wt Readings from Last 3 Encounters:  09/23/13 207 lb 14.4 oz (94.303 kg)  09/11/13 211 lb 14.4 oz (96.117 kg)  09/04/13 211 lb 11.2 oz (96.026 kg)     Lungs - Normal respiratory effort, chest expands symmetrically. Lungs are clear to auscultation, no crackles or wheezes.  Heart has regular rhythm and rate  Abdomen is soft and non tender with normal bowel sounds  Assessment:  Patient tolerating treatments well  Plan: Continue treatment per original radiation prescription

## 2013-09-23 NOTE — Progress Notes (Addendum)
David [name] here with his wife for weekly under treat visit.  He has had 1 fraction to his right chest. He denies pain.  He does report wheezing which he had before radiation started.  He also has a dry cough and has been taking delsym cough medicine.  He denies shortness of breath.  He had his first chemotherapy treatment today.  He was given the radiation therapy and you book and discussed the potential side effects of treatment including pain, shortness of breath, cough, skin changes, throat changes and fatigue.  He was given radiaplex gel and was instructed to apply it twice a day, after treatment and at bedtime.  He was oriented to the clinic and was educated about under treat day with Dr. Roselind Messier on Tuesday's.

## 2013-09-23 NOTE — Patient Instructions (Signed)
[facility] Discharge Instructions for Patients Receiving Chemotherapy  Today you received the following chemotherapy agents Taxol and Carboplatin.  To help prevent nausea and vomiting after your treatment, we encourage you to take your nausea medication.   If you develop nausea and vomiting that is not controlled by your nausea medication, call the clinic.   BELOW ARE SYMPTOMS THAT SHOULD BE REPORTED IMMEDIATELY:  *FEVER GREATER THAN 100.5 F  *CHILLS WITH OR WITHOUT FEVER  NAUSEA AND VOMITING THAT IS NOT CONTROLLED WITH YOUR NAUSEA MEDICATION  *UNUSUAL SHORTNESS OF BREATH  *UNUSUAL BRUISING OR BLEEDING  TENDERNESS IN MOUTH AND THROAT WITH OR WITHOUT PRESENCE OF ULCERS  *URINARY PROBLEMS  *BOWEL PROBLEMS  UNUSUAL RASH Items with * indicate a potential emergency and should be followed up as soon as possible.  Feel free to call the clinic you have any questions or concerns. The clinic phone number is [phone].   Paclitaxel injection What is this medicine? PACLITAXEL (PAK li TAX el) is a chemotherapy drug. It targets fast dividing cells, like cancer cells, and causes these cells to die. This medicine is used to treat ovarian cancer, breast cancer, and other cancers. This medicine may be used for other purposes; ask your health care provider or pharmacist if you have questions. What should I tell my health care provider before I take this medicine? They need to know if you have any of these conditions: -blood disorders -irregular heartbeat -infection (especially a virus infection such as chickenpox, cold sores, or herpes) -liver disease -previous or ongoing radiation therapy -an unusual or allergic reaction to paclitaxel, alcohol, polyoxyethylated castor oil, other chemotherapy agents, other medicines, foods, dyes, or preservatives -pregnant or trying to get pregnant -breast-feeding How should I use this medicine? This drug is given as an infusion into  a vein. It is administered in a hospital or clinic by a specially trained health care professional. Talk to your pediatrician regarding the use of this medicine in children. Special care may be needed. Overdosage: If you think you have taken too much of this medicine contact a poison control center or emergency room at once. NOTE: This medicine is only for you. Do not share this medicine with others. What if I miss a dose? It is important not to miss your dose. Call your doctor or health care professional if you are unable to keep an appointment. What may interact with this medicine? Do not take this medicine with any of the following medications: -disulfiram -metronidazole This medicine may also interact with the following medications: -cyclosporine -dexamethasone -diazepam -ketoconazole -medicines to increase blood counts like filgrastim, pegfilgrastim, sargramostim -other chemotherapy drugs like cisplatin, doxorubicin, epirubicin, etoposide, teniposide, vincristine -quinidine -testosterone -vaccines -verapamil Talk to your doctor or health care professional before taking any of these medicines: -acetaminophen -aspirin -ibuprofen -ketoprofen -naproxen This list may not describe all possible interactions. Give your health care provider a list of all the medicines, herbs, non-prescription drugs, or dietary supplements you use. Also tell them if you smoke, drink alcohol, or use illegal drugs. Some items may interact with your medicine. What should I watch for while using this medicine? Your condition will be monitored carefully while you are receiving this medicine. You will need important blood work done while you are taking this medicine. This drug may make you feel generally unwell. This is not uncommon, as chemotherapy can affect healthy cells as well as cancer cells. Report any side effects. Continue your course of treatment even though you feel  ill unless your doctor tells you to  stop. In some cases, you may be given additional medicines to help with side effects. Follow all directions for their use. Call your doctor or health care professional for advice if you get a fever, chills or sore throat, or other symptoms of a cold or flu. Do not treat yourself. This drug decreases your body's ability to fight infections. Try to avoid being around people who are sick. This medicine may increase your risk to bruise or bleed. Call your doctor or health care professional if you notice any unusual bleeding. Be careful brushing and flossing your teeth or using a toothpick because you may get an infection or bleed more easily. If you have any dental work done, tell your dentist you are receiving this medicine. Avoid taking products that contain aspirin, acetaminophen, ibuprofen, naproxen, or ketoprofen unless instructed by your doctor. These medicines may hide a fever. Do not become pregnant while taking this medicine. Women should inform their doctor if they wish to become pregnant or think they might be pregnant. There is a potential for serious side effects to an unborn child. Talk to your health care professional or pharmacist for more information. Do not breast-feed an infant while taking this medicine. Men are advised not to father a child while receiving this medicine. What side effects may I notice from receiving this medicine? Side effects that you should report to your doctor or health care professional as soon as possible: -allergic reactions like skin rash, itching or hives, swelling of the face, lips, or tongue -low blood counts - This drug may decrease the number of white blood cells, red blood cells and platelets. You may be at increased risk for infections and bleeding. -signs of infection - fever or chills, cough, sore throat, pain or difficulty passing urine -signs of decreased platelets or bleeding - bruising, pinpoint red spots on the skin, black, tarry stools,  nosebleeds -signs of decreased red blood cells - unusually weak or tired, fainting spells, lightheadedness -breathing problems -chest pain -high or low blood pressure -mouth sores -nausea and vomiting -pain, swelling, redness or irritation at the injection site -pain, tingling, numbness in the hands or feet -slow or irregular heartbeat -swelling of the ankle, feet, hands Side effects that usually do not require medical attention (report to your doctor or health care professional if they continue or are bothersome): -bone pain -complete hair loss including hair on your head, underarms, pubic hair, eyebrows, and eyelashes -changes in the color of fingernails -diarrhea -loosening of the fingernails -loss of appetite -muscle or joint pain -red flush to skin -sweating This list may not describe all possible side effects. Call your doctor for medical advice about side effects. You may report side effects to FDA at 1-800-FDA-1088. Where should I keep my medicine? This drug is given in a hospital or clinic and will not be stored at home. NOTE: This sheet is a summary. It may not cover all possible information. If you have questions about this medicine, talk to your doctor, pharmacist, or health care provider.  2013, [employer] Standard. (11/02/2008 11:54:26 AM)  CARBOPLATIN (KAR boe pla tin) is a chemotherapy drug. It targets fast dividing cells, like cancer cells, and causes these cells to die. This medicine is used to treat ovarian cancer and many other cancers. This medicine may be used for other purposes; ask your health care provider or pharmacist if you have questions. What should I tell my health care provider before I take  this medicine? They need to know if you have any of these conditions: -blood disorders -hearing problems -kidney disease -recent or ongoing radiation therapy -an unusual or allergic reaction to carboplatin, cisplatin, other chemotherapy, other medicines,  foods, dyes, or preservatives -pregnant or trying to get pregnant -breast-feeding How should I use this medicine? This drug is usually given as an infusion into a vein. It is administered in a hospital or clinic by a specially trained health care professional. Talk to your pediatrician regarding the use of this medicine in children. Special care may be needed. Overdosage: If you think you have taken too much of this medicine contact a poison control center or emergency room at once. NOTE: This medicine is only for you. Do not share this medicine with others. What if I miss a dose? It is important not to miss a dose. Call your doctor or health care professional if you are unable to keep an appointment. What may interact with this medicine? -medicines for seizures -medicines to increase blood counts like filgrastim, pegfilgrastim, sargramostim -some antibiotics like amikacin, gentamicin, neomycin, streptomycin, tobramycin -vaccines Talk to your doctor or health care professional before taking any of these medicines: -acetaminophen -aspirin -ibuprofen -ketoprofen -naproxen This list may not describe all possible interactions. Give your health care provider a list of all the medicines, herbs, non-prescription drugs, or dietary supplements you use. Also tell them if you smoke, drink alcohol, or use illegal drugs. Some items may interact with your medicine. What should I watch for while using this medicine? Your condition will be monitored carefully while you are receiving this medicine. You will need important blood work done while you are taking this medicine. This drug may make you feel generally unwell. This is not uncommon, as chemotherapy can affect healthy cells as well as cancer cells. Report any side effects. Continue your course of treatment even though you feel ill unless your doctor tells you to stop. In some cases, you may be given additional medicines to help with side effects.  Follow all directions for their use. Call your doctor or health care professional for advice if you get a fever, chills or sore throat, or other symptoms of a cold or flu. Do not treat yourself. This drug decreases your body's ability to fight infections. Try to avoid being around people who are sick. This medicine may increase your risk to bruise or bleed. Call your doctor or health care professional if you notice any unusual bleeding. Be careful brushing and flossing your teeth or using a toothpick because you may get an infection or bleed more easily. If you have any dental work done, tell your dentist you are receiving this medicine. Avoid taking products that contain aspirin, acetaminophen, ibuprofen, naproxen, or ketoprofen unless instructed by your doctor. These medicines may hide a fever. Do not become pregnant while taking this medicine. Women should inform their doctor if they wish to become pregnant or think they might be pregnant. There is a potential for serious side effects to an unborn child. Talk to your health care professional or pharmacist for more information. Do not breast-feed an infant while taking this medicine. What side effects may I notice from receiving this medicine? Side effects that you should report to your doctor or health care professional as soon as possible: -allergic reactions like skin rash, itching or hives, swelling of the face, lips, or tongue -signs of infection - fever or chills, cough, sore throat, pain or difficulty passing urine -signs of decreased platelets  or bleeding - bruising, pinpoint red spots on the skin, black, tarry stools, nosebleeds -signs of decreased red blood cells - unusually weak or tired, fainting spells, lightheadedness -breathing problems -changes in hearing -changes in vision -chest pain -high blood pressure -low blood counts - This drug may decrease the number of white blood cells, red blood cells and platelets. You may be at  increased risk for infections and bleeding. -nausea and vomiting -pain, swelling, redness or irritation at the injection site -pain, tingling, numbness in the hands or feet -problems with balance, talking, walking -trouble passing urine or change in the amount of urine Side effects that usually do not require medical attention (report to your doctor or health care professional if they continue or are bothersome): -hair loss -loss of appetite -metallic taste in the mouth or changes in taste This list may not describe all possible side effects. Call your doctor for medical advice about side effects. You may report side effects to FDA at 1-800-FDA-1088. Where should I keep my medicine? This drug is given in a hospital or clinic and will not be stored at home. NOTE: This sheet is a summary. It may not cover all possible information. If you have questions about this medicine, talk to your doctor, pharmacist, or health care provider.  2012, [employer] Standard. (02/25/2008 2:38:05 PM)

## 2013-09-24 ENCOUNTER — Ambulatory Visit
Admission: RE | Admit: 2013-09-24 | Discharge: 2013-09-24 | Disposition: A | Discharge status: Home / Self Care | Payer: Medicare Other | Source: Ambulatory Visit | Attending: Radiation Oncology | Admitting: Radiation Oncology

## 2013-09-24 ENCOUNTER — Telehealth: Payer: Self-pay | Admitting: *Deleted

## 2013-09-24 NOTE — Telephone Encounter (Signed)
Spoke with pt on cell phone today.  Pt stated he was doing fine.  Denied nausea/vomiting, stated bowel and bladder function fine.  Has antiemetic available and has taken med  prophylactic to prevent nausea as instructed by md.  Pt understood to drink lots of water and other fluids as tolerated - at least 64 oz per day.  Confirmed next f/u appt with pt for 10/27.  Pt voiced understanding and understood to call office if any new problems occur.

## 2013-09-25 ENCOUNTER — Ambulatory Visit
Admission: RE | Admit: 2013-09-25 | Discharge: 2013-09-25 | Disposition: A | Discharge status: Home / Self Care | Payer: Medicare Other | Source: Ambulatory Visit | Attending: Radiation Oncology | Admitting: Radiation Oncology

## 2013-09-26 ENCOUNTER — Ambulatory Visit
Admission: RE | Admit: 2013-09-26 | Discharge: 2013-09-26 | Disposition: A | Discharge status: Home / Self Care | Payer: Medicare Other | Source: Ambulatory Visit | Attending: Radiation Oncology | Admitting: Radiation Oncology

## 2013-09-29 ENCOUNTER — Other Ambulatory Visit (HOSPITAL_BASED_OUTPATIENT_CLINIC_OR_DEPARTMENT_OTHER): Payer: Medicare Other | Admitting: Lab

## 2013-09-29 ENCOUNTER — Ambulatory Visit (HOSPITAL_BASED_OUTPATIENT_CLINIC_OR_DEPARTMENT_OTHER): Payer: Medicare Other

## 2013-09-29 ENCOUNTER — Ambulatory Visit (HOSPITAL_BASED_OUTPATIENT_CLINIC_OR_DEPARTMENT_OTHER): Payer: Medicare Other | Admitting: Internal Medicine

## 2013-09-29 ENCOUNTER — Ambulatory Visit
Admission: RE | Admit: 2013-09-29 | Discharge: 2013-09-29 | Disposition: A | Discharge status: Home / Self Care | Payer: Medicare Other | Source: Ambulatory Visit | Attending: Radiation Oncology | Admitting: Radiation Oncology

## 2013-09-29 ENCOUNTER — Ambulatory Visit: Payer: Medicare Other | Admitting: Nutrition

## 2013-09-29 ENCOUNTER — Encounter: Payer: Self-pay | Admitting: Internal Medicine

## 2013-09-29 ENCOUNTER — Other Ambulatory Visit: Payer: Medicare Other | Admitting: Lab

## 2013-09-29 DIAGNOSIS — C3491 Malignant neoplasm of unspecified part of right bronchus or lung: Secondary | ICD-10-CM

## 2013-09-29 DIAGNOSIS — J449 Chronic obstructive pulmonary disease, unspecified: Secondary | ICD-10-CM

## 2013-09-29 DIAGNOSIS — Z5111 Encounter for antineoplastic chemotherapy: Secondary | ICD-10-CM

## 2013-09-29 DIAGNOSIS — C3410 Malignant neoplasm of upper lobe, unspecified bronchus or lung: Secondary | ICD-10-CM

## 2013-09-29 DIAGNOSIS — I2510 Atherosclerotic heart disease of native coronary artery without angina pectoris: Secondary | ICD-10-CM

## 2013-09-29 LAB — COMPREHENSIVE METABOLIC PANEL (CC13)
ALT: 11 U/L (ref 0–55)
Anion Gap: 10 mEq/L (ref 3–11)
CO2: 20 mEq/L — ABNORMAL LOW (ref 22–29)
Calcium: 10.2 mg/dL (ref 8.4–10.4)
Chloride: 104 mEq/L (ref 98–109)
Creatinine: 1 mg/dL (ref 0.7–1.3)
Sodium: 134 mEq/L — ABNORMAL LOW (ref 136–145)
Total Bilirubin: 0.95 mg/dL (ref 0.20–1.20)
Total Protein: 7.6 g/dL (ref 6.4–8.3)

## 2013-09-29 LAB — CBC WITH DIFFERENTIAL/PLATELET
BASO%: 0.5 % (ref 0.0–2.0)
Eosinophils Absolute: 0.1 10*3/uL (ref 0.0–0.5)
HCT: 42.4 % (ref 38.4–49.9)
MCHC: 33.3 g/dL (ref 32.0–36.0)
MONO#: 0.7 10*3/uL (ref 0.1–0.9)
NEUT#: 10.3 10*3/uL — ABNORMAL HIGH (ref 1.5–6.5)
NEUT%: 87.8 % — ABNORMAL HIGH (ref 39.0–75.0)
RBC: 4.7 10*6/uL (ref 4.20–5.82)
WBC: 11.8 10*3/uL — ABNORMAL HIGH (ref 4.0–10.3)
lymph#: 0.6 10*3/uL — ABNORMAL LOW (ref 0.9–3.3)

## 2013-09-29 MED ORDER — SODIUM CHLORIDE 0.9 % IV SOLN
Freq: Once | INTRAVENOUS | Status: AC
  Administered 2013-09-29: 11:00:00 via INTRAVENOUS

## 2013-09-29 MED ORDER — ONDANSETRON 16 MG/50ML IVPB (CHCC)
16.0000 mg | Freq: Once | INTRAVENOUS | Status: AC
  Administered 2013-09-29: 16 mg via INTRAVENOUS

## 2013-09-29 MED ORDER — SODIUM CHLORIDE 0.9 % IV SOLN
231.6000 mg | Freq: Once | INTRAVENOUS | Status: AC
  Administered 2013-09-29: 230 mg via INTRAVENOUS
  Filled 2013-09-29: qty 23

## 2013-09-29 MED ORDER — DEXAMETHASONE SODIUM PHOSPHATE 20 MG/5ML IJ SOLN
INTRAMUSCULAR | Status: AC
  Filled 2013-09-29: qty 5

## 2013-09-29 MED ORDER — FAMOTIDINE IN NACL 20-0.9 MG/50ML-% IV SOLN
INTRAVENOUS | Status: AC
  Filled 2013-09-29: qty 50

## 2013-09-29 MED ORDER — FAMOTIDINE IN NACL 20-0.9 MG/50ML-% IV SOLN
20.0000 mg | Freq: Once | INTRAVENOUS | Status: AC
  Administered 2013-09-29: 20 mg via INTRAVENOUS

## 2013-09-29 MED ORDER — DEXAMETHASONE SODIUM PHOSPHATE 20 MG/5ML IJ SOLN
20.0000 mg | Freq: Once | INTRAMUSCULAR | Status: AC
  Administered 2013-09-29: 20 mg via INTRAVENOUS

## 2013-09-29 MED ORDER — ONDANSETRON 16 MG/50ML IVPB (CHCC)
INTRAVENOUS | Status: AC
  Filled 2013-09-29: qty 16

## 2013-09-29 MED ORDER — PACLITAXEL CHEMO INJECTION 300 MG/50ML
45.0000 mg/m2 | Freq: Once | INTRAVENOUS | Status: AC
  Administered 2013-09-29: 96 mg via INTRAVENOUS
  Filled 2013-09-29: qty 16

## 2013-09-29 MED ORDER — DIPHENHYDRAMINE HCL 50 MG/ML IJ SOLN
INTRAMUSCULAR | Status: AC
  Filled 2013-09-29: qty 1

## 2013-09-29 MED ORDER — DIPHENHYDRAMINE HCL 50 MG/ML IJ SOLN
50.0000 mg | Freq: Once | INTRAMUSCULAR | Status: AC
  Administered 2013-09-29: 50 mg via INTRAVENOUS

## 2013-09-29 NOTE — Patient Instructions (Signed)
CURRENT THERAPY: Concurrent chemoradiation with weekly carboplatin for AUC of 2 and paclitaxel 45 mg/M2 status post 1 cycle.  CHEMOTHERAPY INTENT: Control/curative  CURRENT # OF CHEMOTHERAPY CYCLES:2  CURRENT ANTIEMETICS: Zofran, dexamethasone and Compazine  CURRENT SMOKING STATUS: Former smoker  ORAL CHEMOTHERAPY AND CONSENT: None  CURRENT BISPHOSPHONATES USE: None  PAIN MANAGEMENT: 0/10  NARCOTICS INDUCED CONSTIPATION: None  LIVING WILL AND CODE STATUS: Full code

## 2013-09-29 NOTE — Progress Notes (Signed)
72 year old male diagnosed with lung cancer.  He is a patient of Dr. Shirline Frees.  Past medical history includes a hypertension, CAD, hypercholesterolemia, GERD, and diabetes.  Medications include, Glucophage, Prilosec, Pravachol, Compazine, Januvia, and Carafate.  Labs were reviewed.  Height: 69 inches. Weight: 204.2 pounds. Usual body weight 215 pounds September 2013. BMI: 30.14.  Patient requested to speak with me today.  He has questions on how to increase protein in his diet.  Patient reports he thinks it would help increased energy.  Nutrition diagnosis:  Food and nutrition related knowledge deficit related to diagnosis of lung cancer and associated treatments as evidenced by no prior need for nutrition related information.  Intervention: Patient was educated on foods containing higher amounts of protein.  He was encouraged to consume protein foods 6 times daily.  Weight stabilization was recommended.  Patient was provided with a fact sheet on ways to increase calories and protein.  We discussed several different brands of protein bars.  I also educated him on snacks to take with him to work.  Contact information was given.  Teach back method used.  Monitoring, evaluation, goals: Patient will tolerate adequate calories and protein to promote weight maintenance.  Next visit: Patient will contact me with any questions or concerns.

## 2013-09-29 NOTE — Progress Notes (Signed)
[facility] [phone]   [phone]  OFFICE PROGRESS NOTE  [name], MD [address] [place] [address]  DIAGNOSIS: Stage IIIA (T2a, N2, M0) non-small cell lung cancer, squamous cell carcinoma presented with large right upper lobe mass as well as mediastinal lymphadenopathy diagnosed in October of 2014  PRIOR THERAPY: None  CURRENT THERAPY: Concurrent chemoradiation with weekly carboplatin for AUC of 2 and paclitaxel 45 mg/M2 status post 1 cycle.  CHEMOTHERAPY INTENT: Control/curative  CURRENT # OF CHEMOTHERAPY CYCLES:2  CURRENT ANTIEMETICS: Zofran, dexamethasone and Compazine  CURRENT SMOKING STATUS: Former smoker  ORAL CHEMOTHERAPY AND CONSENT: None  CURRENT BISPHOSPHONATES USE: None  PAIN MANAGEMENT: 0/10  NARCOTICS INDUCED CONSTIPATION: None  LIVING WILL AND CODE STATUS: Full code   INTERVAL HISTORY: David [name] 72 y.o. male returns to the clinic today for followup visit accompanied by his daughter David [name]. The patient tolerated the first week of his concurrent chemoradiation fairly well with no significant adverse effects. He denied having any significant fever or chills. He denied having any nausea or vomiting. The patient denied having any significant chest pain, shortness of breath, cough or hemoptysis. He has mild sore throat. He has no significant weight loss or night sweats.  MEDICAL HISTORY: Past Medical History  Diagnosis Date  . Coronary artery disease     s/p PCI 2000 and single vessel CABG 2001  . Hypercholesterolemia   . GERD (gastroesophageal reflux disease)   . Tubular adenoma 2003  . COPD (chronic obstructive pulmonary disease)   . Diabetes mellitus   . Arthritis   . Allergic rhinitis   . Cancer     lung mass  . Hypertension     Dr cooper    ALLERGIES:  has No Known Allergies.  MEDICATIONS:  Current Outpatient Prescriptions  Medication Sig Dispense Refill    . carboxymethylcellulose (REFRESH PLUS) 0.5 % SOLN Place 1 drop into both eyes 3 (three) times daily as needed (dry eyes).      Marland Kitchen dextromethorphan (DELSYM) 30 MG/5ML liquid Take 60 mg by mouth as needed for cough.      . EXFORGE 5-160 MG per tablet TAKE ONE TABLET BY MOUTH ONCE DAILY.  30 tablet  9  . hyaluronate sodium (RADIAPLEXRX) GEL Apply 1 application topically 2 (two) times daily.      . hydroxychloroquine (PLAQUENIL) 200 MG tablet Take 200 mg by mouth 2 (two) times daily.        . metFORMIN (GLUCOPHAGE) 500 MG tablet Take 500 mg by mouth 2 (two) times daily with a meal.        . metoprolol succinate (TOPROL-XL) 25 MG 24 hr tablet Take 25 mg by mouth daily.      . non-metallic deodorant Thornton Papas) MISC Apply 1 application topically daily as needed.      Marland Kitchen omeprazole (PRILOSEC) 20 MG capsule Take 20 mg by mouth every morning.       . pravastatin (PRAVACHOL) 20 MG tablet Take 20 mg by mouth daily.      . sitaGLIPtin (JANUVIA) 100 MG tablet Take 100 mg by mouth every morning.       . sucralfate (CARAFATE) 1 GM/10ML suspension Take 10 mLs (1 g total) by mouth 4 (four) times daily.  420 mL  1  . prochlorperazine (COMPAZINE) 10 MG tablet Take 1 tablet (10 mg total) by mouth every 6 (six) hours as needed.  30 tablet  1  No current facility-administered medications for this visit.    SURGICAL HISTORY:  Past Surgical History  Procedure Laterality Date  . Left rotator cuff repair    . Left knee arthroscopy   x2    for meniscal tear  . Coronary stent placement  2001  . Right knee arthroscopy    . Bilateral carpal tunnel release    . Right shoulder arthroscopy    . Coronary artery bypass graft      occlusion of the stent and underwent one-vessel CABG in 2002  . Coronary angioplasty    . Colonoscopy  09/13/2011    Procedure: COLONOSCOPY;  Surgeon: Malissa Hippo, MD;  Location: [name];  Service: Endoscopy;  Laterality: N/A;  . Cataract extraction w/phaco  09/25/2011    Procedure:  CATARACT EXTRACTION PHACO AND INTRAOCULAR LENS PLACEMENT (IOC);  Surgeon: Gemma Payor;  Location: [facility];  Service: Ophthalmology;  Laterality: Left;  CDE:10.79  . Cataract extraction w/phaco  10/05/2011    Procedure: CATARACT EXTRACTION PHACO AND INTRAOCULAR LENS PLACEMENT (IOC);  Surgeon: Gemma Payor;  Location: [facility];  Service: Ophthalmology;  Laterality: Right;  CDE: 8.45  . Video bronchoscopy with endobronchial ultrasound N/A 09/08/2013    Procedure: VIDEO BRONCHOSCOPY WITH ENDOBRONCHIAL ULTRASOUND;  Surgeon: Delight Ovens, MD;  Location: [facility] OR;  Service: Thoracic;  Laterality: N/A;    REVIEW OF SYSTEMS:  Constitutional: negative Eyes: negative Ears, nose, mouth, throat, and face: negative Respiratory: negative Cardiovascular: negative Gastrointestinal: negative Genitourinary:negative Integument/breast: negative Hematologic/lymphatic: negative Musculoskeletal:negative Neurological: negative Behavioral/Psych: negative Endocrine: negative Allergic/Immunologic: negative   PHYSICAL EXAMINATION: General appearance: alert, cooperative, fatigued and no distress Head: Normocephalic, without obvious abnormality, atraumatic Neck: no adenopathy, no JVD, supple, symmetrical, trachea midline and thyroid not enlarged, symmetric, no tenderness/mass/nodules Lymph nodes: Cervical, supraclavicular, and axillary nodes normal. Resp: clear to auscultation bilaterally Back: symmetric, no curvature. ROM normal. No CVA tenderness. Cardio: regular rate and rhythm, S1, S2 normal, no murmur, click, rub or gallop GI: soft, non-tender; bowel sounds normal; no masses,  no organomegaly Extremities: extremities normal, atraumatic, no cyanosis or edema Neurologic: Alert and oriented X 3, normal strength and tone. Normal symmetric reflexes. Normal coordination and gait  ECOG PERFORMANCE STATUS: 1 - Symptomatic but completely ambulatory  Blood pressure 114/64, pulse 106, temperature 98.2 F (36.8 C),  temperature source Oral, resp. rate 18, height 5\' 9"  (1.753 m), weight 204 lb 3.2 oz (92.625 kg).  LABORATORY DATA: Lab Results  Component Value Date   WBC 11.8* 09/29/2013   HGB 14.1 09/29/2013   HCT 42.4 09/29/2013   MCV 90.1 09/29/2013   PLT 281 09/29/2013      Chemistry      Component Value Date/Time   NA 137 09/23/2013 1107   NA 135 09/04/2013 1419   K 4.3 09/23/2013 1107   K 4.3 09/04/2013 1419   CL 98 09/04/2013 1419   CO2 23 09/23/2013 1107   CO2 28 09/04/2013 1419   BUN 10.5 09/23/2013 1107   BUN 10 09/04/2013 1419   CREATININE 0.9 09/23/2013 1107   CREATININE 0.82 09/04/2013 1419      Component Value Date/Time   CALCIUM 10.0 09/23/2013 1107   CALCIUM 9.4 09/04/2013 1419   ALKPHOS 80 09/23/2013 1107   ALKPHOS 78 09/04/2013 1419   AST 14 09/23/2013 1107   AST 13 09/04/2013 1419   ALT 9 09/23/2013 1107   ALT 10 09/04/2013 1419   BILITOT 0.59 09/23/2013 1107   BILITOT 0.7 09/04/2013 1419  RADIOGRAPHIC STUDIES: Dg Chest 2 View Within Previous 72 Hours.  Films Obtained On Friday Are Acceptable For Monday And Tuesday Cases  09/08/2013   CLINICAL DATA:  Preoperative films for biopsy of lung mass.  EXAM: CHEST  2 VIEW  COMPARISON:  CT chest 08/19/2013. PET scan 09/02/2013.  FINDINGS: A posterior right upper lobe lung mass is again noted. Emphysematous changes are evident. No additional airspace disease is present. The heart size is normal. The visualized soft tissues and bony thorax are unremarkable.  IMPRESSION: 1. Posterior right upper lobe lung mass. 2. Emphysema. 3. No acute cardiopulmonary disease.   Electronically Signed   By: Gennette Pac M.D.   On: 09/08/2013 07:15   Mr Laqueta Jean ZO Contrast  09/02/2013   CLINICAL DATA:  72 year old male with recent diagnosis of lung mass. Staging. Initial encounter.  BUN and creatinine were obtained on site at [facility] at  [address]  Results:  BUN 12 mg/dL,  Creatinine 0.9 mg/dL.  Estimated GFR 83.  EXAM: MRI  HEAD WITHOUT AND WITH CONTRAST  TECHNIQUE: Multiplanar, multiecho pulse sequences of the brain and surrounding structures were obtained according to standard protocol without and with intravenous contrast  CONTRAST:  19mL MULTIHANCE GADOBENATE DIMEGLUMINE 529 MG/ML IV SOLN  COMPARISON:  Paranasal sinus CT 04/10/2006.  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 No abnormal enhancement identified. No midline shift, mass effect, or intracranial mass lesion identified.  No restricted diffusion to suggest acute infarction. No ventriculomegaly, extra-axial collection or acute intracranial hemorrhage. Cervicomedullary junction and pituitary are within normal limits. Negative visualized cervical spine. Normal bone marrow signal.  Minimal to mild for age scattered nonspecific cerebral white matter T2 and FLAIR hyperintensity. Normal visualized internal auditory structures. Trace left mastoid fluid.  Postoperative changes to the globes. Mild ethmoid sinus mucosal thickening. Negative scalp soft tissues.  IMPRESSION: No acute or metastatic intracranial abnormality. Negative for age MRI appearance of the brain.   Electronically Signed   By: Augusto Gamble M.D.   On: 09/02/2013 15:51   Nm Pet Image Initial (pi) Skull Base To Thigh  09/02/2013   CLINICAL DATA:  Initial treatment strategy for right lung mass.  EXAM: NUCLEAR MEDICINE PET SKULL BASE TO THIGH  FASTING BLOOD GLUCOSE:  Value:  207 mg/dl  TECHNIQUE: 10.9 mCi U-04 FDG was injected intravenously. CT data was obtained and used for attenuation correction and anatomic localization only. (This was not acquired as a diagnostic CT examination.) Additional exam technical data entered on technologist worksheet.  COMPARISON:  Chest CT on 08/19/2013  FINDINGS: NECK  No hypermetabolic lymph nodes in the neck.  CHEST  Hypermetabolic mass is seen in the central right upper lobe which involves the right hilum. This has a maximum SUV of 18.1.  Mild hypermetabolic postobstructive pneumonitis seen in the peripheral right upper lobe.  No hypermetabolic mediastinal or left hilar lymph nodes are identified.  ABDOMEN/PELVIS  No abnormal hypermetabolic activity within the liver, pancreas, adrenal glands, or spleen. No hypermetabolic lymph nodes in the abdomen or pelvis.  SKELETON  No focal hypermetabolic activity to suggest skeletal metastasis.  IMPRESSION: Hypermetabolic central right upper lobe mass with involvement of the right hilum. Mild postobstructive pneumonitis noted in the peripheral right upper lobe.  No evidence hypermetabolic mediastinal lymph nodes or extrathoracic metastatic disease.   Electronically Signed   By: Myles Rosenthal   On: 09/02/2013 16:21    ASSESSMENT AND PLAN: This is a very pleasant 72 years old  white male recently diagnosed with a stage IIIa non-small cell lung cancer. He is currently undergoing concurrent chemoradiation with weekly carboplatin and paclitaxel is status post 1 cycle. The patient is tolerating his treatment fairly well with no significant adverse effects. Will proceed with cycle #2 today as scheduled. The patient and his daughter had several questions and I answered them completely to their satisfaction. He would come back for followup visit in 2 weeks for evaluation and management any adverse effect of his treatment.  The patient voices understanding of current disease status and treatment options and is in agreement with the current care plan.  All questions were answered. The patient knows to call the clinic with any problems, questions or concerns. We can certainly see the patient much sooner if necessary.  I spent 15 minutes counseling the patient face to face. The total time spent in the appointment was 25 minutes.

## 2013-09-29 NOTE — Patient Instructions (Signed)
[facility]cer Center Discharge Instructions for Patients Receiving Chemotherapy  Today you received the following chemotherapy agents Taxol and Carboplatin.  To help prevent nausea and vomiting after your treatment, we encourage you to take your nausea medication.   If you develop nausea and vomiting that is not controlled by your nausea medication, call the clinic.   BELOW ARE SYMPTOMS THAT SHOULD BE REPORTED IMMEDIATELY:  *FEVER GREATER THAN 100.5 F  *CHILLS WITH OR WITHOUT FEVER  NAUSEA AND VOMITING THAT IS NOT CONTROLLED WITH YOUR NAUSEA MEDICATION  *UNUSUAL SHORTNESS OF BREATH  *UNUSUAL BRUISING OR BLEEDING  TENDERNESS IN MOUTH AND THROAT WITH OR WITHOUT PRESENCE OF ULCERS  *URINARY PROBLEMS  *BOWEL PROBLEMS  UNUSUAL RASH Items with * indicate a potential emergency and should be followed up as soon as possible.  Feel free to call the clinic you have any questions or concerns. The clinic phone number is [phone].    

## 2013-09-30 ENCOUNTER — Ambulatory Visit
Admission: RE | Admit: 2013-09-30 | Discharge: 2013-09-30 | Disposition: A | Discharge status: Home / Self Care | Payer: Medicare Other | Source: Ambulatory Visit | Attending: Radiation Oncology | Admitting: Radiation Oncology

## 2013-09-30 NOTE — Progress Notes (Signed)
David [name] here for under treat visit.  He has had 6 fractions to his right chest.  He denies pain.  He does have a cough and says it is a little better since starting radiation.  He also has hoarseness which he says is about the same since starting to take carafate.  He denies a sore throat, fatigue and shortness of breath.  He reports that his skin is intact in the treatment area.  He is using radiaplex gel.

## 2013-09-30 NOTE — Progress Notes (Signed)
[facility]    Radiation Oncology [address] [place]     Maryln Gottron, M.D. [place], [place] [address]               Billie Lade, M.D., Ph.D. Phone: [phone]      Molli Hazard A. Kathrynn Running, M.D. Fax: [phone]      Radene Gunning, M.D., Ph.D.         Lurline Hare, M.D.         [name] Jack, M.D Weekly Treatment Management Note  Name: David [name]     MRN: [id number]        CSN: [id number] Date: 09/30/2013      DOB: [birth year]  CC: [name], MD         [facility]    Status: Outpatient  Diagnosis: The encounter diagnosis was Malignant neoplasm of upper lobe, bronchus or lung, right.  Current Dose: 10.8 Gy  Current Fraction: 6  Planned Dose: 63 Gy  Narrative: David [name] was seen today for weekly treatment management. The chart was checked and CBCT  were reviewed. He continues to tolerate his treatments well without any side effects this time.  Review of patient's allergies indicates no known allergies.  Current Outpatient Prescriptions  Medication Sig Dispense Refill  . carboxymethylcellulose (REFRESH PLUS) 0.5 % SOLN Place 1 drop into both eyes 3 (three) times daily as needed (dry eyes).      Marland Kitchen dextromethorphan (DELSYM) 30 MG/5ML liquid Take 60 mg by mouth as needed for cough.      . EXFORGE 5-160 MG per tablet TAKE ONE TABLET BY MOUTH ONCE DAILY.  30 tablet  9  . hyaluronate sodium (RADIAPLEXRX) GEL Apply 1 application topically 2 (two) times daily.      . hydroxychloroquine (PLAQUENIL) 200 MG tablet Take 200 mg by mouth 2 (two) times daily.        . metFORMIN (GLUCOPHAGE) 500 MG tablet Take 500 mg by mouth 2 (two) times daily with a meal.        . metoprolol succinate (TOPROL-XL) 25 MG 24 hr tablet Take 25 mg by mouth daily.      Marland Kitchen omeprazole (PRILOSEC) 20 MG capsule Take 20 mg by mouth every morning.       . pravastatin (PRAVACHOL) 20 MG tablet Take 20 mg by mouth daily.      . prochlorperazine (COMPAZINE) 10 MG tablet Take 1  tablet (10 mg total) by mouth every 6 (six) hours as needed.  30 tablet  1  . sitaGLIPtin (JANUVIA) 100 MG tablet Take 100 mg by mouth every morning.       . sucralfate (CARAFATE) 1 GM/10ML suspension Take 10 mLs (1 g total) by mouth 4 (four) times daily.  420 mL  1   No current facility-administered medications for this encounter.   Labs:  Lab Results  Component Value Date   WBC 11.8* 09/29/2013   HGB 14.1 09/29/2013   HCT 42.4 09/29/2013   MCV 90.1 09/29/2013   PLT 281 09/29/2013   Lab Results  Component Value Date   CREATININE 1.0 09/29/2013   BUN 19.2 09/29/2013   NA 134* 09/29/2013   K 4.5 09/29/2013   CL 98 09/04/2013   CO2 20* 09/29/2013   Lab Results  Component Value Date   ALT 11 09/29/2013   AST 13 09/29/2013   BILITOT 0.95 09/29/2013    Physical Examination:  height is 5\' 9"  (1.753 m) and weight is 206 lb  9.6 oz (93.713 kg). His temperature is 98.1 F (36.7 C). His blood pressure is 124/74 and his pulse is 106. His oxygen saturation is 99%.    Wt Readings from Last 3 Encounters:  09/30/13 206 lb 9.6 oz (93.713 kg)  09/29/13 204 lb 3.2 oz (92.625 kg)  09/23/13 207 lb 14.4 oz (94.303 kg)    No palpable supraclavicular adenopathy. Lungs - Normal respiratory effort, chest expands symmetrically. Lungs are clear to auscultation, no crackles or wheezes.  Heart has regular rhythm and rate  Abdomen is soft and non tender with normal bowel sounds  Assessment:  Patient tolerating treatments well  Plan: Continue treatment per original radiation prescription

## 2013-10-01 ENCOUNTER — Ambulatory Visit
Admission: RE | Admit: 2013-10-01 | Discharge: 2013-10-01 | Disposition: A | Discharge status: Home / Self Care | Payer: Medicare Other | Source: Ambulatory Visit | Attending: Radiation Oncology | Admitting: Radiation Oncology

## 2013-10-02 ENCOUNTER — Telehealth: Payer: Self-pay | Admitting: Internal Medicine

## 2013-10-02 ENCOUNTER — Ambulatory Visit
Admission: RE | Admit: 2013-10-02 | Discharge: 2013-10-02 | Disposition: A | Discharge status: Home / Self Care | Payer: Medicare Other | Source: Ambulatory Visit | Attending: Radiation Oncology | Admitting: Radiation Oncology

## 2013-10-02 NOTE — Telephone Encounter (Signed)
call pt and lvm that appt for 11.3.14 have been added and can be pick up at todays visit...emailed MM for appt for 11.10.14

## 2013-10-03 ENCOUNTER — Ambulatory Visit
Admission: RE | Admit: 2013-10-03 | Discharge: 2013-10-03 | Disposition: A | Discharge status: Home / Self Care | Payer: Medicare Other | Source: Ambulatory Visit | Attending: Radiation Oncology | Admitting: Radiation Oncology

## 2013-10-03 ENCOUNTER — Telehealth: Payer: Self-pay | Admitting: Internal Medicine

## 2013-10-03 NOTE — Telephone Encounter (Signed)
lvm for opt regarding to 11.11. appt that MD not same day at tx...mailt pt letter and avs

## 2013-10-06 ENCOUNTER — Ambulatory Visit (HOSPITAL_BASED_OUTPATIENT_CLINIC_OR_DEPARTMENT_OTHER): Payer: Medicare Other

## 2013-10-06 ENCOUNTER — Ambulatory Visit
Admission: RE | Admit: 2013-10-06 | Discharge: 2013-10-06 | Disposition: A | Discharge status: Home / Self Care | Payer: Medicare Other | Source: Ambulatory Visit | Attending: Radiation Oncology | Admitting: Radiation Oncology

## 2013-10-06 ENCOUNTER — Other Ambulatory Visit: Payer: Self-pay | Admitting: Cardiology

## 2013-10-06 ENCOUNTER — Other Ambulatory Visit (HOSPITAL_BASED_OUTPATIENT_CLINIC_OR_DEPARTMENT_OTHER): Payer: Medicare Other | Admitting: Lab

## 2013-10-06 DIAGNOSIS — C3410 Malignant neoplasm of upper lobe, unspecified bronchus or lung: Secondary | ICD-10-CM

## 2013-10-06 DIAGNOSIS — C3491 Malignant neoplasm of unspecified part of right bronchus or lung: Secondary | ICD-10-CM

## 2013-10-06 DIAGNOSIS — Z5111 Encounter for antineoplastic chemotherapy: Secondary | ICD-10-CM

## 2013-10-06 LAB — COMPREHENSIVE METABOLIC PANEL (CC13)
ALT: 7 U/L (ref 0–55)
Albumin: 2.8 g/dL — ABNORMAL LOW (ref 3.5–5.0)
Alkaline Phosphatase: 67 U/L (ref 40–150)
Anion Gap: 9 mEq/L (ref 3–11)
BUN: 9 mg/dL (ref 7.0–26.0)
CO2: 24 mEq/L (ref 22–29)
Glucose: 253 mg/dl — ABNORMAL HIGH (ref 70–140)
Potassium: 4.4 mEq/L (ref 3.5–5.1)
Sodium: 136 mEq/L (ref 136–145)
Total Protein: 6.9 g/dL (ref 6.4–8.3)

## 2013-10-06 LAB — FUNGUS CULTURE W SMEAR: Fungal Smear: NONE SEEN

## 2013-10-06 LAB — CBC WITH DIFFERENTIAL/PLATELET
Basophils Absolute: 0 10*3/uL (ref 0.0–0.1)
HCT: 36.8 % — ABNORMAL LOW (ref 38.4–49.9)
HGB: 12.4 g/dL — ABNORMAL LOW (ref 13.0–17.1)
MCHC: 33.7 g/dL (ref 32.0–36.0)
MCV: 88.9 fL (ref 79.3–98.0)
MONO#: 0.8 10*3/uL (ref 0.1–0.9)
MONO%: 9.6 % (ref 0.0–14.0)
NEUT%: 84 % — ABNORMAL HIGH (ref 39.0–75.0)
WBC: 8.4 10*3/uL (ref 4.0–10.3)
lymph#: 0.5 10*3/uL — ABNORMAL LOW (ref 0.9–3.3)

## 2013-10-06 MED ORDER — ONDANSETRON 16 MG/50ML IVPB (CHCC)
INTRAVENOUS | Status: AC
  Filled 2013-10-06: qty 16

## 2013-10-06 MED ORDER — FAMOTIDINE IN NACL 20-0.9 MG/50ML-% IV SOLN
INTRAVENOUS | Status: AC
  Filled 2013-10-06: qty 50

## 2013-10-06 MED ORDER — FAMOTIDINE IN NACL 20-0.9 MG/50ML-% IV SOLN
20.0000 mg | Freq: Once | INTRAVENOUS | Status: AC
  Administered 2013-10-06: 20 mg via INTRAVENOUS

## 2013-10-06 MED ORDER — DIPHENHYDRAMINE HCL 50 MG/ML IJ SOLN
50.0000 mg | Freq: Once | INTRAMUSCULAR | Status: AC
  Administered 2013-10-06: 50 mg via INTRAVENOUS

## 2013-10-06 MED ORDER — DIPHENHYDRAMINE HCL 50 MG/ML IJ SOLN
INTRAMUSCULAR | Status: AC
  Filled 2013-10-06: qty 1

## 2013-10-06 MED ORDER — SODIUM CHLORIDE 0.9 % IV SOLN
231.6000 mg | Freq: Once | INTRAVENOUS | Status: AC
  Administered 2013-10-06: 230 mg via INTRAVENOUS
  Filled 2013-10-06: qty 23

## 2013-10-06 MED ORDER — PACLITAXEL CHEMO INJECTION 300 MG/50ML
45.0000 mg/m2 | Freq: Once | INTRAVENOUS | Status: AC
  Administered 2013-10-06: 96 mg via INTRAVENOUS
  Filled 2013-10-06: qty 16

## 2013-10-06 MED ORDER — DEXAMETHASONE SODIUM PHOSPHATE 20 MG/5ML IJ SOLN
INTRAMUSCULAR | Status: AC
  Filled 2013-10-06: qty 5

## 2013-10-06 MED ORDER — ONDANSETRON 16 MG/50ML IVPB (CHCC)
16.0000 mg | Freq: Once | INTRAVENOUS | Status: AC
  Administered 2013-10-06: 16 mg via INTRAVENOUS

## 2013-10-06 MED ORDER — DEXAMETHASONE SODIUM PHOSPHATE 20 MG/5ML IJ SOLN
20.0000 mg | Freq: Once | INTRAMUSCULAR | Status: AC
  Administered 2013-10-06: 20 mg via INTRAVENOUS

## 2013-10-06 MED ORDER — SODIUM CHLORIDE 0.9 % IV SOLN
Freq: Once | INTRAVENOUS | Status: AC
  Administered 2013-10-06: 11:00:00 via INTRAVENOUS

## 2013-10-06 NOTE — Patient Instructions (Signed)
[facility] Discharge Instructions for Patients Receiving Chemotherapy  Today you received the following chemotherapy agents: Taxol and Carboplatin.  To help prevent nausea and vomiting after your treatment, we encourage you to take your nausea medication as prescribed.   If you develop nausea and vomiting that is not controlled by your nausea medication, call the clinic.   BELOW ARE SYMPTOMS THAT SHOULD BE REPORTED IMMEDIATELY:  *FEVER GREATER THAN 100.5 F  *CHILLS WITH OR WITHOUT FEVER  NAUSEA AND VOMITING THAT IS NOT CONTROLLED WITH YOUR NAUSEA MEDICATION  *UNUSUAL SHORTNESS OF BREATH  *UNUSUAL BRUISING OR BLEEDING  TENDERNESS IN MOUTH AND THROAT WITH OR WITHOUT PRESENCE OF ULCERS  *URINARY PROBLEMS  *BOWEL PROBLEMS  UNUSUAL RASH Items with * indicate a potential emergency and should be followed up as soon as possible.  Feel free to call the clinic you have any questions or concerns. The clinic phone number is [phone].    

## 2013-10-07 ENCOUNTER — Ambulatory Visit
Admission: RE | Admit: 2013-10-07 | Discharge: 2013-10-07 | Disposition: A | Discharge status: Home / Self Care | Payer: Medicare Other | Source: Ambulatory Visit | Attending: Radiation Oncology | Admitting: Radiation Oncology

## 2013-10-07 ENCOUNTER — Encounter: Payer: Self-pay | Admitting: Radiation Oncology

## 2013-10-07 NOTE — Progress Notes (Signed)
[facility]    Radiation Oncology [address] [place]     Maryln Gottron, M.D. [place], [place] [address]               Billie Lade, M.D., Ph.D. Phone: [phone]      Molli Hazard A. Kathrynn Running, M.D. Fax: [phone]      Radene Gunning, M.D., Ph.D.         Lurline Hare, M.D.         [name] Jack, M.D Weekly Treatment Management Note  Name: [name]     MRN: [id number]        CSN: [id number] Date: 10/07/2013      DOB: [birth year]  CC: [name], MD         [facility]    Status: Outpatient  Diagnosis: The encounter diagnosis was Malignant neoplasm of upper lobe, bronchus or lung, right.  Current Dose: 19.8 Gy  Current Fraction: 11  Planned Dose: 63 Gy  Narrative: David [name] was seen today for weekly treatment management. The chart was checked and CBCT  were reviewed. He continues to tolerate his treatments well at this time. He denies any breathing problems swallowing difficulties or significant fatigue. He continues to use Carafate on a regular basis.  Review of patient's allergies indicates no known allergies.  Current Outpatient Prescriptions  Medication Sig Dispense Refill  . carboxymethylcellulose (REFRESH PLUS) 0.5 % SOLN Place 1 drop into both eyes 3 (three) times daily as needed (dry eyes).      Marland Kitchen dextromethorphan (DELSYM) 30 MG/5ML liquid Take 60 mg by mouth as needed for cough.      . EXFORGE 5-160 MG per tablet TAKE ONE TABLET BY MOUTH ONCE DAILY.  30 tablet  9  . hyaluronate sodium (RADIAPLEXRX) GEL Apply 1 application topically 2 (two) times daily.      . hydroxychloroquine (PLAQUENIL) 200 MG tablet Take 200 mg by mouth 2 (two) times daily.        . metFORMIN (GLUCOPHAGE) 500 MG tablet Take 500 mg by mouth 2 (two) times daily with a meal.        . metoprolol succinate (TOPROL-XL) 25 MG 24 hr tablet Take 25 mg by mouth daily.      Marland Kitchen omeprazole (PRILOSEC) 20 MG capsule Take 20 mg by mouth every morning.       . pravastatin  (PRAVACHOL) 20 MG tablet TAKE ONE TABLET DAILY.  30 tablet  3  . prochlorperazine (COMPAZINE) 10 MG tablet Take 1 tablet (10 mg total) by mouth every 6 (six) hours as needed.  30 tablet  1  . sitaGLIPtin (JANUVIA) 100 MG tablet Take 100 mg by mouth every morning.       . sucralfate (CARAFATE) 1 GM/10ML suspension Take 10 mLs (1 g total) by mouth 4 (four) times daily.  420 mL  1   No current facility-administered medications for this encounter.   Labs:  Lab Results  Component Value Date   WBC 8.4 10/06/2013   HGB 12.4* 10/06/2013   HCT 36.8* 10/06/2013   MCV 88.9 10/06/2013   PLT 294 10/06/2013   Lab Results  Component Value Date   CREATININE 0.9 10/06/2013   BUN 9.0 10/06/2013   NA 136 10/06/2013   K 4.4 10/06/2013   CL 98 09/04/2013   CO2 24 10/06/2013   Lab Results  Component Value Date   ALT 7 10/06/2013   AST 8 10/06/2013   BILITOT 0.49 10/06/2013  Physical Examination:  weight is 203 lb 9.6 oz (92.352 kg). His oral temperature is 97.9 F (36.6 C). His blood pressure is 90/60 and his pulse is 110. His respiration is 20 and oxygen saturation is 99%.    Wt Readings from Last 3 Encounters:  10/07/13 203 lb 9.6 oz (92.352 kg)  09/30/13 206 lb 9.6 oz (93.713 kg)  09/29/13 204 lb 3.2 oz (92.625 kg)    No significant skin reaction in the chest region. Lungs - Normal respiratory effort, chest expands symmetrically. Lungs are clear to auscultation, no crackles or wheezes.  Heart has regular rhythm and rate  Abdomen is soft and non tender with normal bowel sounds  Assessment:  Patient tolerating treatments well  Plan: Continue treatment per original radiation prescription

## 2013-10-07 NOTE — Progress Notes (Signed)
Weekly rad txs,11  chest rt completed  Erythema o back of right chest,skin intact, using biafine cream bid, coughs up clear phelgm, energy good, eating well, no difficulty swallowing no nausea 9:15 AM

## 2013-10-08 ENCOUNTER — Ambulatory Visit
Admission: RE | Admit: 2013-10-08 | Discharge: 2013-10-08 | Disposition: A | Discharge status: Home / Self Care | Payer: Medicare Other | Source: Ambulatory Visit | Attending: Radiation Oncology | Admitting: Radiation Oncology

## 2013-10-09 ENCOUNTER — Other Ambulatory Visit: Payer: Self-pay

## 2013-10-09 ENCOUNTER — Ambulatory Visit
Admission: RE | Admit: 2013-10-09 | Discharge: 2013-10-09 | Disposition: A | Discharge status: Home / Self Care | Payer: Medicare Other | Source: Ambulatory Visit | Attending: Radiation Oncology | Admitting: Radiation Oncology

## 2013-10-10 ENCOUNTER — Ambulatory Visit
Admission: RE | Admit: 2013-10-10 | Discharge: 2013-10-10 | Disposition: A | Discharge status: Home / Self Care | Payer: Medicare Other | Source: Ambulatory Visit | Attending: Radiation Oncology | Admitting: Radiation Oncology

## 2013-10-13 ENCOUNTER — Other Ambulatory Visit (HOSPITAL_BASED_OUTPATIENT_CLINIC_OR_DEPARTMENT_OTHER): Payer: Medicare Other | Admitting: Lab

## 2013-10-13 ENCOUNTER — Ambulatory Visit
Admission: RE | Admit: 2013-10-13 | Discharge: 2013-10-13 | Disposition: A | Discharge status: Home / Self Care | Payer: Medicare Other | Source: Ambulatory Visit | Attending: Radiation Oncology | Admitting: Radiation Oncology

## 2013-10-13 ENCOUNTER — Ambulatory Visit (HOSPITAL_BASED_OUTPATIENT_CLINIC_OR_DEPARTMENT_OTHER): Payer: Medicare Other

## 2013-10-13 DIAGNOSIS — Z5111 Encounter for antineoplastic chemotherapy: Secondary | ICD-10-CM

## 2013-10-13 DIAGNOSIS — C3410 Malignant neoplasm of upper lobe, unspecified bronchus or lung: Secondary | ICD-10-CM

## 2013-10-13 DIAGNOSIS — C3491 Malignant neoplasm of unspecified part of right bronchus or lung: Secondary | ICD-10-CM

## 2013-10-13 LAB — CBC WITH DIFFERENTIAL/PLATELET
Basophils Absolute: 0 10*3/uL (ref 0.0–0.1)
EOS%: 0.7 % (ref 0.0–7.0)
Eosinophils Absolute: 0.1 10*3/uL (ref 0.0–0.5)
HCT: 36.8 % — ABNORMAL LOW (ref 38.4–49.9)
HGB: 12.5 g/dL — ABNORMAL LOW (ref 13.0–17.1)
MCH: 30 pg (ref 27.2–33.4)
MCV: 88.5 fL (ref 79.3–98.0)
MONO%: 7.5 % (ref 0.0–14.0)
NEUT#: 7 10*3/uL — ABNORMAL HIGH (ref 1.5–6.5)
NEUT%: 83.4 % — ABNORMAL HIGH (ref 39.0–75.0)
lymph#: 0.7 10*3/uL — ABNORMAL LOW (ref 0.9–3.3)

## 2013-10-13 LAB — COMPREHENSIVE METABOLIC PANEL (CC13)
ALT: 9 U/L (ref 0–55)
Albumin: 3 g/dL — ABNORMAL LOW (ref 3.5–5.0)
Alkaline Phosphatase: 76 U/L (ref 40–150)
Anion Gap: 11 mEq/L (ref 3–11)
BUN: 9 mg/dL (ref 7.0–26.0)
CO2: 21 mEq/L — ABNORMAL LOW (ref 22–29)
Calcium: 9.9 mg/dL (ref 8.4–10.4)
Glucose: 271 mg/dl — ABNORMAL HIGH (ref 70–140)
Potassium: 4.7 mEq/L (ref 3.5–5.1)
Sodium: 135 mEq/L — ABNORMAL LOW (ref 136–145)
Total Bilirubin: 0.39 mg/dL (ref 0.20–1.20)
Total Protein: 7.5 g/dL (ref 6.4–8.3)

## 2013-10-13 MED ORDER — SODIUM CHLORIDE 0.9 % IV SOLN
Freq: Once | INTRAVENOUS | Status: AC
  Administered 2013-10-13: 10:00:00 via INTRAVENOUS

## 2013-10-13 MED ORDER — ONDANSETRON 16 MG/50ML IVPB (CHCC)
INTRAVENOUS | Status: AC
  Filled 2013-10-13: qty 16

## 2013-10-13 MED ORDER — SODIUM CHLORIDE 0.9 % IV SOLN
231.6000 mg | Freq: Once | INTRAVENOUS | Status: AC
  Administered 2013-10-13: 230 mg via INTRAVENOUS
  Filled 2013-10-13: qty 23

## 2013-10-13 MED ORDER — ONDANSETRON 16 MG/50ML IVPB (CHCC)
16.0000 mg | Freq: Once | INTRAVENOUS | Status: AC
  Administered 2013-10-13: 16 mg via INTRAVENOUS

## 2013-10-13 MED ORDER — FAMOTIDINE IN NACL 20-0.9 MG/50ML-% IV SOLN
INTRAVENOUS | Status: AC
  Filled 2013-10-13: qty 50

## 2013-10-13 MED ORDER — DEXAMETHASONE SODIUM PHOSPHATE 20 MG/5ML IJ SOLN
20.0000 mg | Freq: Once | INTRAMUSCULAR | Status: AC
  Administered 2013-10-13: 20 mg via INTRAVENOUS

## 2013-10-13 MED ORDER — FAMOTIDINE IN NACL 20-0.9 MG/50ML-% IV SOLN
20.0000 mg | Freq: Once | INTRAVENOUS | Status: AC
  Administered 2013-10-13: 20 mg via INTRAVENOUS

## 2013-10-13 MED ORDER — DIPHENHYDRAMINE HCL 50 MG/ML IJ SOLN
50.0000 mg | Freq: Once | INTRAMUSCULAR | Status: AC
  Administered 2013-10-13: 50 mg via INTRAVENOUS

## 2013-10-13 MED ORDER — DEXAMETHASONE SODIUM PHOSPHATE 20 MG/5ML IJ SOLN
INTRAMUSCULAR | Status: AC
  Filled 2013-10-13: qty 5

## 2013-10-13 MED ORDER — DIPHENHYDRAMINE HCL 50 MG/ML IJ SOLN
INTRAMUSCULAR | Status: AC
  Filled 2013-10-13: qty 1

## 2013-10-13 MED ORDER — DEXTROSE 5 % IV SOLN
45.0000 mg/m2 | Freq: Once | INTRAVENOUS | Status: AC
  Administered 2013-10-13: 96 mg via INTRAVENOUS
  Filled 2013-10-13: qty 16

## 2013-10-13 NOTE — Patient Instructions (Signed)
[facility] Discharge Instructions for Patients Receiving Chemotherapy  Today you received the following chemotherapy agents: Taxol and Carboplatin.  To help prevent nausea and vomiting after your treatment, we encourage you to take your nausea medication as prescribed.   If you develop nausea and vomiting that is not controlled by your nausea medication, call the clinic.   BELOW ARE SYMPTOMS THAT SHOULD BE REPORTED IMMEDIATELY:  *FEVER GREATER THAN 100.5 F  *CHILLS WITH OR WITHOUT FEVER  NAUSEA AND VOMITING THAT IS NOT CONTROLLED WITH YOUR NAUSEA MEDICATION  *UNUSUAL SHORTNESS OF BREATH  *UNUSUAL BRUISING OR BLEEDING  TENDERNESS IN MOUTH AND THROAT WITH OR WITHOUT PRESENCE OF ULCERS  *URINARY PROBLEMS  *BOWEL PROBLEMS  UNUSUAL RASH Items with * indicate a potential emergency and should be followed up as soon as possible.  Feel free to call the clinic you have any questions or concerns. The clinic phone number is [phone].    

## 2013-10-14 ENCOUNTER — Ambulatory Visit (HOSPITAL_BASED_OUTPATIENT_CLINIC_OR_DEPARTMENT_OTHER): Payer: Medicare Other | Admitting: Physician Assistant

## 2013-10-14 ENCOUNTER — Encounter: Payer: Self-pay | Admitting: Physician Assistant

## 2013-10-14 ENCOUNTER — Ambulatory Visit
Admission: RE | Admit: 2013-10-14 | Discharge: 2013-10-14 | Disposition: A | Discharge status: Home / Self Care | Payer: Medicare Other | Source: Ambulatory Visit | Attending: Radiation Oncology | Admitting: Radiation Oncology

## 2013-10-14 DIAGNOSIS — I2510 Atherosclerotic heart disease of native coronary artery without angina pectoris: Secondary | ICD-10-CM

## 2013-10-14 DIAGNOSIS — C3410 Malignant neoplasm of upper lobe, unspecified bronchus or lung: Secondary | ICD-10-CM

## 2013-10-14 DIAGNOSIS — J449 Chronic obstructive pulmonary disease, unspecified: Secondary | ICD-10-CM

## 2013-10-14 DIAGNOSIS — R5381 Other malaise: Secondary | ICD-10-CM

## 2013-10-14 NOTE — Progress Notes (Addendum)
[facility] [phone]   [phone]) [phone]  SHARED VISIT PROGRESS NOTE  [name], MD [address] [place] [address]  DIAGNOSIS: Stage IIIA (T2a, N2, M0) non-small cell lung cancer, squamous cell carcinoma presented with large right upper lobe mass as well as mediastinal lymphadenopathy diagnosed in October of 2014  PRIOR THERAPY: None  CURRENT THERAPY: Concurrent chemoradiation with weekly carboplatin for AUC of 2 and paclitaxel 45 mg/M2 status post 3 cycle.  CHEMOTHERAPY INTENT: Control/curative  CURRENT # OF CHEMOTHERAPY CYCLES: 4  CURRENT ANTIEMETICS: Zofran, dexamethasone and Compazine  CURRENT SMOKING STATUS: Former smoker  ORAL CHEMOTHERAPY AND CONSENT: None  CURRENT BISPHOSPHONATES USE: None  PAIN MANAGEMENT: 0/10  NARCOTICS INDUCED CONSTIPATION: None  LIVING WILL AND CODE STATUS: Full code   INTERVAL HISTORY: David [name] 72 y.o. male returns to the clinic today for followup visit. Overall he is tolerating his course of concurrent chemoradiation with the exception of slight decreased energy level. He takes naps a couple times during the day and and is combating the decreased energy relatively well. He continues to have a good appetite. He has not had any problems with nausea or vomiting. He reports that his swallowing is doing fine with the Carafate as prescribed. He voiced no other specific complaints today. He is scheduled to complete his radiation therapy on 11/12/2013.  He denied having any significant fever or chills. He denied having any nausea or vomiting. The patient denied having any significant chest pain, shortness of breath, cough or hemoptysis. He has mild sore throat. He has no significant weight loss or night sweats.  MEDICAL HISTORY: Past Medical History  Diagnosis Date  . Coronary artery disease     s/p PCI 2000 and single vessel CABG 2001  . Hypercholesterolemia   . GERD  (gastroesophageal reflux disease)   . Tubular adenoma 2003  . COPD (chronic obstructive pulmonary disease)   . Diabetes mellitus   . Arthritis   . Allergic rhinitis   . Cancer     lung mass  . Hypertension     Dr cooper    ALLERGIES:  has No Known Allergies.  MEDICATIONS:  Current Outpatient Prescriptions  Medication Sig Dispense Refill  . carboxymethylcellulose (REFRESH PLUS) 0.5 % SOLN Place 1 drop into both eyes 3 (three) times daily as needed (dry eyes).      Marland Kitchen dextromethorphan (DELSYM) 30 MG/5ML liquid Take 60 mg by mouth as needed for cough.      . EXFORGE 5-160 MG per tablet TAKE ONE TABLET BY MOUTH ONCE DAILY.  30 tablet  9  . hyaluronate sodium (RADIAPLEXRX) GEL Apply 1 application topically 2 (two) times daily.      . hydroxychloroquine (PLAQUENIL) 200 MG tablet Take 200 mg by mouth 2 (two) times daily.        . metFORMIN (GLUCOPHAGE) 500 MG tablet Take 500 mg by mouth 2 (two) times daily with a meal.        . metoprolol succinate (TOPROL-XL) 25 MG 24 hr tablet Take 25 mg by mouth daily.      Marland Kitchen omeprazole (PRILOSEC) 20 MG capsule Take 20 mg by mouth every morning.       . pravastatin (PRAVACHOL) 20 MG tablet TAKE ONE TABLET DAILY.  30 tablet  3  . prochlorperazine (COMPAZINE) 10 MG tablet Take 1 tablet (10 mg total) by mouth every 6 (six) hours as needed.  30 tablet  1  .  sitaGLIPtin (JANUVIA) 100 MG tablet Take 100 mg by mouth every morning.       . sucralfate (CARAFATE) 1 GM/10ML suspension Take 10 mLs (1 g total) by mouth 4 (four) times daily.  420 mL  1   No current facility-administered medications for this visit.    SURGICAL HISTORY:  Past Surgical History  Procedure Laterality Date  . Left rotator cuff repair    . Left knee arthroscopy   x2    for meniscal tear  . Coronary stent placement  2001  . Right knee arthroscopy    . Bilateral carpal tunnel release    . Right shoulder arthroscopy    . Coronary artery bypass graft      occlusion of the stent and  underwent one-vessel CABG in 2002  . Coronary angioplasty    . Colonoscopy  09/13/2011    Procedure: COLONOSCOPY;  Surgeon: Malissa Hippo, MD;  Location: [facility];  Service: Endoscopy;  Laterality: N/A;  . Cataract extraction w/phaco  09/25/2011    Procedure: CATARACT EXTRACTION PHACO AND INTRAOCULAR LENS PLACEMENT (IOC);  Surgeon: Gemma Payor;  Location: [facility];  Service: Ophthalmology;  Laterality: Left;  CDE:10.79  . Cataract extraction w/phaco  10/05/2011    Procedure: CATARACT EXTRACTION PHACO AND INTRAOCULAR LENS PLACEMENT (IOC);  Surgeon: Gemma Payor;  Location: [facility];  Service: Ophthalmology;  Laterality: Right;  CDE: 8.45  . Video bronchoscopy with endobronchial ultrasound N/A 09/08/2013    Procedure: VIDEO BRONCHOSCOPY WITH ENDOBRONCHIAL ULTRASOUND;  Surgeon: Delight Ovens, MD;  Location: [facility];  Service: Thoracic;  Laterality: N/A;    REVIEW OF SYSTEMS:  Constitutional: positive for fatigue Eyes: negative Ears, nose, mouth, throat, and face: negative Respiratory: negative Cardiovascular: negative Gastrointestinal: negative Genitourinary:negative Integument/breast: negative Hematologic/lymphatic: negative Musculoskeletal:negative Neurological: negative Behavioral/Psych: negative Endocrine: negative Allergic/Immunologic: negative   PHYSICAL EXAMINATION: General appearance: alert, cooperative, fatigued and no distress Head: Normocephalic, without obvious abnormality, atraumatic Neck: no adenopathy, no JVD, supple, symmetrical, trachea midline and thyroid not enlarged, symmetric, no tenderness/mass/nodules Lymph nodes: Cervical, supraclavicular, and axillary nodes normal. Resp: clear to auscultation bilaterally Back: symmetric, no curvature. ROM normal. No CVA tenderness. Cardio: regular rate and rhythm, S1, S2 normal, no murmur, click, rub or gallop GI: soft, non-tender; bowel sounds normal; no masses,  no organomegaly Extremities: extremities normal, atraumatic, no  cyanosis or edema Neurologic: Alert and oriented X 3, normal strength and tone. Normal symmetric reflexes. Normal coordination and gait  ECOG PERFORMANCE STATUS: 1 - Symptomatic but completely ambulatory  Blood pressure 127/80, pulse 102, temperature 96 F (35.6 C), temperature source Oral, resp. rate 20, height 5\' 9"  (1.753 m), weight 203 lb 12.8 oz (92.443 kg).  LABORATORY DATA: Lab Results  Component Value Date   WBC 8.4 10/13/2013   HGB 12.5* 10/13/2013   HCT 36.8* 10/13/2013   MCV 88.5 10/13/2013   PLT 305 10/13/2013      Chemistry      Component Value Date/Time   NA 135* 10/13/2013 0912   NA 135 09/04/2013 1419   K 4.7 10/13/2013 0912   K 4.3 09/04/2013 1419   CL 98 09/04/2013 1419   CO2 21* 10/13/2013 0912   CO2 28 09/04/2013 1419   BUN 9.0 10/13/2013 0912   BUN 10 09/04/2013 1419   CREATININE 0.9 10/13/2013 0912   CREATININE 0.82 09/04/2013 1419      Component Value Date/Time   CALCIUM 9.9 10/13/2013 0912   CALCIUM 9.4 09/04/2013 1419   ALKPHOS 76 10/13/2013 0912  ALKPHOS 78 09/04/2013 1419   AST 9 10/13/2013 0912   AST 13 09/04/2013 1419   ALT 9 10/13/2013 0912   ALT 10 09/04/2013 1419   BILITOT 0.39 10/13/2013 0912   BILITOT 0.7 09/04/2013 1419       RADIOGRAPHIC STUDIES: Dg Chest 2 View Within Previous 72 Hours.  Films Obtained On Friday Are Acceptable For Monday And Tuesday Cases  09/08/2013   CLINICAL DATA:  Preoperative films for biopsy of lung mass.  EXAM: CHEST  2 VIEW  COMPARISON:  CT chest 08/19/2013. PET scan 09/02/2013.  FINDINGS: A posterior right upper lobe lung mass is again noted. Emphysematous changes are evident. No additional airspace disease is present. The heart size is normal. The visualized soft tissues and bony thorax are unremarkable.  IMPRESSION: 1. Posterior right upper lobe lung mass. 2. Emphysema. 3. No acute cardiopulmonary disease.   Electronically Signed   By: Gennette Pac M.D.   On: 09/08/2013 07:15   Mr Laqueta Jean ZO  Contrast  09/02/2013   CLINICAL DATA:  72 year old male with recent diagnosis of lung mass. Staging. Initial encounter.  BUN and creatinine were obtained on site at [facility] at  [address]  Results:  BUN 12 mg/dL,  Creatinine 0.9 mg/dL.  Estimated GFR 83.  EXAM: MRI HEAD WITHOUT AND WITH CONTRAST  TECHNIQUE: Multiplanar, multiecho pulse sequences of the brain and surrounding structures were obtained according to standard protocol without and with intravenous contrast  CONTRAST:  19mL MULTIHANCE GADOBENATE DIMEGLUMINE 529 MG/ML IV SOLN  COMPARISON:  Paranasal sinus CT 04/10/2006.  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 No abnormal enhancement identified. No midline shift, mass effect, or intracranial mass lesion identified.  No restricted diffusion to suggest acute infarction. No ventriculomegaly, extra-axial collection or acute intracranial hemorrhage. Cervicomedullary junction and pituitary are within normal limits. Negative visualized cervical spine. Normal bone marrow signal.  Minimal to mild for age scattered nonspecific cerebral white matter T2 and FLAIR hyperintensity. Normal visualized internal auditory structures. Trace left mastoid fluid.  Postoperative changes to the globes. Mild ethmoid sinus mucosal thickening. Negative scalp soft tissues.  IMPRESSION: No acute or metastatic intracranial abnormality. Negative for age MRI appearance of the brain.   Electronically Signed   By: Augusto Gamble M.D.   On: 09/02/2013 15:51   Nm Pet Image Initial (pi) Skull Base To Thigh  09/02/2013   CLINICAL DATA:  Initial treatment strategy for right lung mass.  EXAM: NUCLEAR MEDICINE PET SKULL BASE TO THIGH  FASTING BLOOD GLUCOSE:  Value:  207 mg/dl  TECHNIQUE: 10.9 mCi U-04 FDG was injected intravenously. CT data was obtained and used for attenuation correction and anatomic localization only. (This was not acquired as a diagnostic CT examination.) Additional  exam technical data entered on technologist worksheet.  COMPARISON:  Chest CT on 08/19/2013  FINDINGS: NECK  No hypermetabolic lymph nodes in the neck.  CHEST  Hypermetabolic mass is seen in the central right upper lobe which involves the right hilum. This has a maximum SUV of 18.1. Mild hypermetabolic postobstructive pneumonitis seen in the peripheral right upper lobe.  No hypermetabolic mediastinal or left hilar lymph nodes are identified.  ABDOMEN/PELVIS  No abnormal hypermetabolic activity within the liver, pancreas, adrenal glands, or spleen. No hypermetabolic lymph nodes in the abdomen or pelvis.  SKELETON  No focal hypermetabolic activity to suggest skeletal metastasis.  IMPRESSION: Hypermetabolic central right upper lobe mass with involvement of the right hilum. Mild postobstructive pneumonitis noted  in the peripheral right upper lobe.  No evidence hypermetabolic mediastinal lymph nodes or extrathoracic metastatic disease.   Electronically Signed   By: Myles Rosenthal   On: 09/02/2013 16:21    ASSESSMENT AND PLAN: This is a very pleasant 72 years old white male recently diagnosed with a stage IIIa non-small cell lung cancer.He is currently undergoing concurrent chemoradiation with weekly carboplatin and paclitaxel is status post 3 cycles. Overall is tolerating his course of concurrent chemoradiation relatively well with the exception of some mild fatigue. The patient was discussed with also seen by Dr. Arbutus Ped. He'll continue with his course of concurrent chemoradiation as scheduled. He'll followup 3 weeks for another symptom management visit.  Hermila Millis E, PA-C .  The patient voices understanding of current disease status and treatment options and is in agreement with the current care plan.  All questions were answered. The patient knows to call the clinic with any problems, questions or concerns. We can certainly see the patient much sooner if necessary.  ADDENDUM: Hematology/Oncology  Attending: I had a face to face encounter with the patient.. I recommended his care plan. This is a very pleasant 72 years old white male with a stage IIIa non-small cell lung cancer currently undergoing concurrent chemoradiation with weekly carboplatin and paclitaxel is status post 3 cycles. The patient is tolerating his treatment fairly well with no significant adverse effects. He denied having any odynophagia or dysphagia. He denied having any chest pain, shortness of breath or hemoptysis.. I recommended for the patient to continue his current treatment as scheduled. He would come back for follow up visit in 3 weeks for reevaluation and management any adverse effect of his chemotherapy. He was advised to call me immediately if he has any concerning symptoms in the interval. Lajuana Matte., MD 10/15/2013

## 2013-10-14 NOTE — Progress Notes (Signed)
David [name] here for weekly under treat visit.  He has had 16 fractions to his right chest.  He denies pain.  He does have a dry cough that is worse at night.  He denies coughing up blood.  He says he is hoarse but denies a sore throat.  He is able to swallow without difficulty.  He does have shortness of breath with activity.  He does have occasional fatigue.  The skin on his right chest and right back is pink.  He is using radiaplex gel twice a day.

## 2013-10-14 NOTE — Progress Notes (Signed)
[facility]    Radiation Oncology [address] [place]     Maryln Gottron, M.D. [place], [place] [address]               Billie Lade, M.D., Ph.D. Phone: [phone]      Molli Hazard A. Kathrynn Running, M.D. Fax: [phone]      Radene Gunning, M.D., Ph.D.         Lurline Hare, M.D.         [name] Jack, M.D Weekly Treatment Management Note  Name: David [name]     MRN: [id number]        CSN: [id number] Date: 10/14/2013      DOB: Feb 05, 1941  CC: [name], MD         [place]    Status: Outpatient  Diagnosis: The encounter diagnosis was Malignant neoplasm of upper lobe, bronchus or lung, right.  Current Dose: 28.8 Gy  Current Fraction: 16  Planned Dose: 63 Gy  Narrative: [name] was seen today for weekly treatment management. The chart was checked and CBCT  were reviewed. He continues to tolerate his treatments well. He denies any significant swallowing problems or fatigue. His breathing is stable.  Review of patient's allergies indicates no known allergies.  Current Outpatient Prescriptions  Medication Sig Dispense Refill  . carboxymethylcellulose (REFRESH PLUS) 0.5 % SOLN Place 1 drop into both eyes 3 (three) times daily as needed (dry eyes).      Marland Kitchen dextromethorphan (DELSYM) 30 MG/5ML liquid Take 60 mg by mouth as needed for cough.      . EXFORGE 5-160 MG per tablet TAKE ONE TABLET BY MOUTH ONCE DAILY.  30 tablet  9  . hyaluronate sodium (RADIAPLEXRX) GEL Apply 1 application topically 2 (two) times daily.      . hydroxychloroquine (PLAQUENIL) 200 MG tablet Take 200 mg by mouth 2 (two) times daily.        . metFORMIN (GLUCOPHAGE) 500 MG tablet Take 500 mg by mouth 2 (two) times daily with a meal.        . metoprolol succinate (TOPROL-XL) 25 MG 24 hr tablet Take 25 mg by mouth daily.      Marland Kitchen omeprazole (PRILOSEC) 20 MG capsule Take 20 mg by mouth every morning.       . pravastatin (PRAVACHOL) 20 MG tablet TAKE ONE TABLET DAILY.  30 tablet  3  .  prochlorperazine (COMPAZINE) 10 MG tablet Take 1 tablet (10 mg total) by mouth every 6 (six) hours as needed.  30 tablet  1  . sitaGLIPtin (JANUVIA) 100 MG tablet Take 100 mg by mouth every morning.       . sucralfate (CARAFATE) 1 GM/10ML suspension Take 10 mLs (1 g total) by mouth 4 (four) times daily.  420 mL  1   No current facility-administered medications for this encounter.   Labs:  Lab Results  Component Value Date   WBC 8.4 10/13/2013   HGB 12.5* 10/13/2013   HCT 36.8* 10/13/2013   MCV 88.5 10/13/2013   PLT 305 10/13/2013   Lab Results  Component Value Date   CREATININE 0.9 10/13/2013   BUN 9.0 10/13/2013   NA 135* 10/13/2013   K 4.7 10/13/2013   CL 98 09/04/2013   CO2 21* 10/13/2013   Lab Results  Component Value Date   ALT 9 10/13/2013   AST 9 10/13/2013   BILITOT 0.39 10/13/2013    Physical Examination:  height is 5\' 9"  (1.753 m)  and weight is 205 lb 12.8 oz (93.35 kg). His temperature is 97.6 F (36.4 C). His blood pressure is 120/62 and his pulse is 94. His oxygen saturation is 99%.    Wt Readings from Last 3 Encounters:  10/14/13 205 lb 12.8 oz (93.35 kg)  10/07/13 203 lb 9.6 oz (92.352 kg)  09/30/13 206 lb 9.6 oz (93.713 kg)    The skin of the right back region shows some erythema but really no significant radiation reaction at this time. Lungs - Normal respiratory effort, chest expands symmetrically. Lungs are clear to auscultation, no crackles or wheezes.  Heart has regular rhythm and rate  Abdomen is soft and non tender with normal bowel sounds  Assessment:  Patient tolerating treatments well  Plan: Continue treatment per original radiation prescription

## 2013-10-15 ENCOUNTER — Ambulatory Visit
Admission: RE | Admit: 2013-10-15 | Discharge: 2013-10-15 | Disposition: A | Discharge status: Home / Self Care | Payer: Medicare Other | Source: Ambulatory Visit | Attending: Radiation Oncology | Admitting: Radiation Oncology

## 2013-10-15 ENCOUNTER — Telehealth: Payer: Self-pay | Admitting: Physician Assistant

## 2013-10-15 MED ORDER — RADIAPLEXRX EX GEL
Freq: Once | CUTANEOUS | Status: AC
  Administered 2013-10-15: 11:00:00 via TOPICAL

## 2013-10-15 NOTE — Patient Instructions (Signed)
Continue with your course of concurrent chemoradiation as scheduled Followup in 3 weeks 

## 2013-10-15 NOTE — Telephone Encounter (Signed)
LVMM making pt aware added appt w AJ on 12/1 after labs and tx shh

## 2013-10-16 ENCOUNTER — Ambulatory Visit
Admission: RE | Admit: 2013-10-16 | Discharge: 2013-10-16 | Disposition: A | Discharge status: Home / Self Care | Payer: Medicare Other | Source: Ambulatory Visit | Attending: Radiation Oncology | Admitting: Radiation Oncology

## 2013-10-17 ENCOUNTER — Ambulatory Visit
Admission: RE | Admit: 2013-10-17 | Discharge: 2013-10-17 | Disposition: A | Discharge status: Home / Self Care | Payer: Medicare Other | Source: Ambulatory Visit | Attending: Radiation Oncology | Admitting: Radiation Oncology

## 2013-10-20 ENCOUNTER — Ambulatory Visit
Admission: RE | Admit: 2013-10-20 | Discharge: 2013-10-20 | Disposition: A | Discharge status: Home / Self Care | Payer: Medicare Other | Source: Ambulatory Visit | Attending: Radiation Oncology | Admitting: Radiation Oncology

## 2013-10-20 ENCOUNTER — Ambulatory Visit (HOSPITAL_BASED_OUTPATIENT_CLINIC_OR_DEPARTMENT_OTHER): Payer: Medicare Other

## 2013-10-20 ENCOUNTER — Other Ambulatory Visit: Payer: Self-pay | Admitting: *Deleted

## 2013-10-20 ENCOUNTER — Other Ambulatory Visit (HOSPITAL_BASED_OUTPATIENT_CLINIC_OR_DEPARTMENT_OTHER): Payer: Medicare Other | Admitting: Lab

## 2013-10-20 DIAGNOSIS — C3491 Malignant neoplasm of unspecified part of right bronchus or lung: Secondary | ICD-10-CM

## 2013-10-20 DIAGNOSIS — C3410 Malignant neoplasm of upper lobe, unspecified bronchus or lung: Secondary | ICD-10-CM

## 2013-10-20 DIAGNOSIS — Z5111 Encounter for antineoplastic chemotherapy: Secondary | ICD-10-CM

## 2013-10-20 LAB — COMPREHENSIVE METABOLIC PANEL (CC13)
ALT: 10 U/L (ref 0–55)
AST: 10 U/L (ref 5–34)
Alkaline Phosphatase: 68 U/L (ref 40–150)
BUN: 16.7 mg/dL (ref 7.0–26.0)
Calcium: 9.9 mg/dL (ref 8.4–10.4)
Chloride: 105 mEq/L (ref 98–109)
Glucose: 267 mg/dl — ABNORMAL HIGH (ref 70–140)
Potassium: 4.3 mEq/L (ref 3.5–5.1)
Total Bilirubin: 0.39 mg/dL (ref 0.20–1.20)

## 2013-10-20 LAB — CBC WITH DIFFERENTIAL/PLATELET
BASO%: 0.4 % (ref 0.0–2.0)
Basophils Absolute: 0 10*3/uL (ref 0.0–0.1)
EOS%: 0.2 % (ref 0.0–7.0)
Eosinophils Absolute: 0 10*3/uL (ref 0.0–0.5)
HGB: 12.6 g/dL — ABNORMAL LOW (ref 13.0–17.1)
MCH: 30.3 pg (ref 27.2–33.4)
MCHC: 34.4 g/dL (ref 32.0–36.0)
MCV: 88 fL (ref 79.3–98.0)
MONO#: 0.5 10*3/uL (ref 0.1–0.9)
MONO%: 6.6 % (ref 0.0–14.0)
NEUT#: 6.9 10*3/uL — ABNORMAL HIGH (ref 1.5–6.5)
NEUT%: 85.1 % — ABNORMAL HIGH (ref 39.0–75.0)
RDW: 13.6 % (ref 11.0–14.6)
lymph#: 0.6 10*3/uL — ABNORMAL LOW (ref 0.9–3.3)
nRBC: 0 % (ref 0–0)

## 2013-10-20 MED ORDER — SUCRALFATE 1 GM/10ML PO SUSP: 1.0000 g | Freq: Four times a day (QID) | ORAL | Status: DC

## 2013-10-20 MED ORDER — DIPHENHYDRAMINE HCL 50 MG/ML IJ SOLN
INTRAMUSCULAR | Status: AC
  Filled 2013-10-20: qty 1

## 2013-10-20 MED ORDER — DEXAMETHASONE SODIUM PHOSPHATE 20 MG/5ML IJ SOLN
INTRAMUSCULAR | Status: AC
  Filled 2013-10-20: qty 5

## 2013-10-20 MED ORDER — FAMOTIDINE IN NACL 20-0.9 MG/50ML-% IV SOLN
20.0000 mg | Freq: Once | INTRAVENOUS | Status: AC
  Administered 2013-10-20: 20 mg via INTRAVENOUS

## 2013-10-20 MED ORDER — SODIUM CHLORIDE 0.9 % IV SOLN
Freq: Once | INTRAVENOUS | Status: AC
  Administered 2013-10-20: 10:00:00 via INTRAVENOUS

## 2013-10-20 MED ORDER — DEXAMETHASONE SODIUM PHOSPHATE 20 MG/5ML IJ SOLN
20.0000 mg | Freq: Once | INTRAMUSCULAR | Status: AC
  Administered 2013-10-20: 20 mg via INTRAVENOUS

## 2013-10-20 MED ORDER — SODIUM CHLORIDE 0.9 % IV SOLN
231.6000 mg | Freq: Once | INTRAVENOUS | Status: AC
  Administered 2013-10-20: 230 mg via INTRAVENOUS
  Filled 2013-10-20: qty 23

## 2013-10-20 MED ORDER — ONDANSETRON 16 MG/50ML IVPB (CHCC)
16.0000 mg | Freq: Once | INTRAVENOUS | Status: AC
  Administered 2013-10-20: 16 mg via INTRAVENOUS

## 2013-10-20 MED ORDER — FAMOTIDINE IN NACL 20-0.9 MG/50ML-% IV SOLN
INTRAVENOUS | Status: AC
  Filled 2013-10-20: qty 50

## 2013-10-20 MED ORDER — DIPHENHYDRAMINE HCL 50 MG/ML IJ SOLN
50.0000 mg | Freq: Once | INTRAMUSCULAR | Status: AC
  Administered 2013-10-20: 50 mg via INTRAVENOUS

## 2013-10-20 MED ORDER — ONDANSETRON 16 MG/50ML IVPB (CHCC)
INTRAVENOUS | Status: AC
  Filled 2013-10-20: qty 16

## 2013-10-20 MED ORDER — PACLITAXEL CHEMO INJECTION 300 MG/50ML
45.0000 mg/m2 | Freq: Once | INTRAVENOUS | Status: AC
  Administered 2013-10-20: 96 mg via INTRAVENOUS
  Filled 2013-10-20: qty 16

## 2013-10-20 NOTE — Patient Instructions (Signed)
[facility] Discharge Instructions for Patients Receiving Chemotherapy  Today you received the following chemotherapy agents:  Taxol and Carboplatin  To help prevent nausea and vomiting after your treatment, we encourage you to take your nausea medication as ordered per MD.   If you develop nausea and vomiting that is not controlled by your nausea medication, call the clinic.   BELOW ARE SYMPTOMS THAT SHOULD BE REPORTED IMMEDIATELY:  *FEVER GREATER THAN 100.5 F  *CHILLS WITH OR WITHOUT FEVER  NAUSEA AND VOMITING THAT IS NOT CONTROLLED WITH YOUR NAUSEA MEDICATION  *UNUSUAL SHORTNESS OF BREATH  *UNUSUAL BRUISING OR BLEEDING  TENDERNESS IN MOUTH AND THROAT WITH OR WITHOUT PRESENCE OF ULCERS  *URINARY PROBLEMS  *BOWEL PROBLEMS  UNUSUAL RASH Items with * indicate a potential emergency and should be followed up as soon as possible.  Feel free to call the clinic you have any questions or concerns. The clinic phone number is [phone].    

## 2013-10-21 ENCOUNTER — Ambulatory Visit
Admission: RE | Admit: 2013-10-21 | Discharge: 2013-10-21 | Disposition: A | Discharge status: Home / Self Care | Payer: Medicare Other | Source: Ambulatory Visit | Attending: Radiation Oncology | Admitting: Radiation Oncology

## 2013-10-21 LAB — AFB CULTURE WITH SMEAR (NOT AT ARMC): Acid Fast Smear: NONE SEEN

## 2013-10-21 NOTE — Progress Notes (Signed)
Weekly rad txs, chest  No c/o pain, erythema on front of chest , on back  Erythema scant areas peeling started, and c/o slight itching stated Non productive cough at times, eating well, slight tightness in throat but takes caraafte before meals and bedtime which helps, drinks plenty water,98% room air sats, energy level pretty good stated

## 2013-10-21 NOTE — Progress Notes (Signed)
[facility]    Radiation Oncology [address] [place]     Maryln Gottron, M.D. [place], [place] [address]               Billie Lade, M.D., Ph.D. Phone: [phone]      Molli Hazard A. Kathrynn Running, M.D. Fax: [phone]      Radene Gunning, M.D., Ph.D.         Lurline Hare, M.D.         [name] Jack, M.D Weekly Treatment Management Note  Name: David [name]     MRN: [id number]        CSN: [id number] Date: 10/21/2013      DOB: August 22, 1941  CC: [name], MD         [place]    Status: Outpatient  Diagnosis: The encounter diagnosis was Malignant neoplasm of upper lobe, bronchus or lung, right.  Current Dose: 37.8 gy  Current Fraction: 21  Planned Dose: 63 Gy  Narrative: David [name] was seen today for weekly treatment management. The chart was checked and CBCT  were reviewed. He continues to tolerate the treatments well. He denies any significant swallowing problems or breathing problems.  He continues on Carafate.  Review of patient's allergies indicates no known allergies. Current Outpatient Prescriptions  Medication Sig Dispense Refill  . carboxymethylcellulose (REFRESH PLUS) 0.5 % SOLN Place 1 drop into both eyes 3 (three) times daily as needed (dry eyes).      Marland Kitchen dextromethorphan (DELSYM) 30 MG/5ML liquid Take 60 mg by mouth as needed for cough.      . EXFORGE 5-160 MG per tablet TAKE ONE TABLET BY MOUTH ONCE DAILY.  30 tablet  9  . hyaluronate sodium (RADIAPLEXRX) GEL Apply 1 application topically 2 (two) times daily.      . hydroxychloroquine (PLAQUENIL) 200 MG tablet Take 200 mg by mouth 2 (two) times daily.        . metFORMIN (GLUCOPHAGE) 500 MG tablet Take 500 mg by mouth 2 (two) times daily with a meal.        . metoprolol succinate (TOPROL-XL) 25 MG 24 hr tablet Take 25 mg by mouth daily.      Marland Kitchen omeprazole (PRILOSEC) 20 MG capsule Take 20 mg by mouth every morning.       . pravastatin (PRAVACHOL) 20 MG tablet TAKE ONE TABLET DAILY.  30  tablet  3  . prochlorperazine (COMPAZINE) 10 MG tablet Take 1 tablet (10 mg total) by mouth every 6 (six) hours as needed.  30 tablet  1  . sitaGLIPtin (JANUVIA) 100 MG tablet Take 100 mg by mouth every morning.       . sucralfate (CARAFATE) 1 GM/10ML suspension Take 10 mLs (1 g total) by mouth 4 (four) times daily.  420 mL  1   No current facility-administered medications for this encounter.   Labs:  Lab Results  Component Value Date   WBC 8.1 10/20/2013   HGB 12.6* 10/20/2013   HCT 36.6* 10/20/2013   MCV 88.0 10/20/2013   PLT 238 10/20/2013   Lab Results  Component Value Date   CREATININE 1.3 10/20/2013   BUN 16.7 10/20/2013   NA 136 10/20/2013   K 4.3 10/20/2013   CL 98 09/04/2013   CO2 22 10/20/2013   Lab Results  Component Value Date   ALT 10 10/20/2013   AST 10 10/20/2013   BILITOT 0.39 10/20/2013    Physical Examination:  There were no vitals filed  for this visit.  Wt Readings from Last 3 Encounters:  10/21/13 203 lb 14.4 oz (92.488 kg)  10/14/13 203 lb 12.8 oz (92.443 kg)  10/14/13 205 lb 12.8 oz (93.35 kg)    Some erythema in the radiation portals but no moist desquamation. Lungs - Normal respiratory effort, chest expands symmetrically. Lungs are clear to auscultation, no crackles or wheezes.  Heart has regular rhythm and rate  Abdomen is soft and non tender with normal bowel sounds  Assessment:  Patient tolerating treatments well  Plan: Continue treatment per original radiation prescription

## 2013-10-22 ENCOUNTER — Ambulatory Visit
Admission: RE | Admit: 2013-10-22 | Discharge: 2013-10-22 | Disposition: A | Discharge status: Home / Self Care | Payer: Medicare Other | Source: Ambulatory Visit | Attending: Radiation Oncology | Admitting: Radiation Oncology

## 2013-10-23 ENCOUNTER — Ambulatory Visit
Admission: RE | Admit: 2013-10-23 | Discharge: 2013-10-23 | Disposition: A | Discharge status: Home / Self Care | Payer: Medicare Other | Source: Ambulatory Visit | Attending: Radiation Oncology | Admitting: Radiation Oncology

## 2013-10-24 ENCOUNTER — Ambulatory Visit
Admission: RE | Admit: 2013-10-24 | Discharge: 2013-10-24 | Disposition: A | Discharge status: Home / Self Care | Payer: Medicare Other | Source: Ambulatory Visit | Attending: Radiation Oncology | Admitting: Radiation Oncology

## 2013-10-26 ENCOUNTER — Ambulatory Visit
Admission: RE | Admit: 2013-10-26 | Discharge: 2013-10-26 | Disposition: A | Discharge status: Home / Self Care | Payer: Medicare Other | Source: Ambulatory Visit | Attending: Radiation Oncology | Admitting: Radiation Oncology

## 2013-10-27 ENCOUNTER — Ambulatory Visit (HOSPITAL_BASED_OUTPATIENT_CLINIC_OR_DEPARTMENT_OTHER): Payer: Medicare Other

## 2013-10-27 ENCOUNTER — Other Ambulatory Visit: Payer: Self-pay | Admitting: Internal Medicine

## 2013-10-27 ENCOUNTER — Ambulatory Visit
Admission: RE | Admit: 2013-10-27 | Discharge: 2013-10-27 | Disposition: A | Discharge status: Home / Self Care | Payer: Medicare Other | Source: Ambulatory Visit | Attending: Radiation Oncology | Admitting: Radiation Oncology

## 2013-10-27 ENCOUNTER — Other Ambulatory Visit (HOSPITAL_BASED_OUTPATIENT_CLINIC_OR_DEPARTMENT_OTHER): Payer: Medicare Other

## 2013-10-27 DIAGNOSIS — C3410 Malignant neoplasm of upper lobe, unspecified bronchus or lung: Secondary | ICD-10-CM

## 2013-10-27 DIAGNOSIS — Z5111 Encounter for antineoplastic chemotherapy: Secondary | ICD-10-CM

## 2013-10-27 DIAGNOSIS — C3491 Malignant neoplasm of unspecified part of right bronchus or lung: Secondary | ICD-10-CM

## 2013-10-27 LAB — COMPREHENSIVE METABOLIC PANEL (CC13)
ALT: 10 U/L (ref 0–55)
Anion Gap: 8 mEq/L (ref 3–11)
CO2: 22 mEq/L (ref 22–29)
Creatinine: 0.9 mg/dL (ref 0.7–1.3)
Glucose: 213 mg/dl — ABNORMAL HIGH (ref 70–140)
Total Bilirubin: 0.31 mg/dL (ref 0.20–1.20)

## 2013-10-27 LAB — CBC WITH DIFFERENTIAL/PLATELET
BASO%: 0.3 % (ref 0.0–2.0)
EOS%: 0.2 % (ref 0.0–7.0)
Eosinophils Absolute: 0 10*3/uL (ref 0.0–0.5)
HCT: 35.7 % — ABNORMAL LOW (ref 38.4–49.9)
LYMPH%: 6.2 % — ABNORMAL LOW (ref 14.0–49.0)
MCHC: 33.6 g/dL (ref 32.0–36.0)
MCV: 89 fL (ref 79.3–98.0)
MONO#: 0.3 10*3/uL (ref 0.1–0.9)
NEUT#: 5.1 10*3/uL (ref 1.5–6.5)
NEUT%: 88.3 % — ABNORMAL HIGH (ref 39.0–75.0)
Platelets: 218 10*3/uL (ref 140–400)
WBC: 5.8 10*3/uL (ref 4.0–10.3)

## 2013-10-27 MED ORDER — DIPHENHYDRAMINE HCL 50 MG/ML IJ SOLN
50.0000 mg | Freq: Once | INTRAMUSCULAR | Status: AC
  Administered 2013-10-27: 50 mg via INTRAVENOUS

## 2013-10-27 MED ORDER — DEXAMETHASONE SODIUM PHOSPHATE 20 MG/5ML IJ SOLN
INTRAMUSCULAR | Status: AC
  Filled 2013-10-27: qty 5

## 2013-10-27 MED ORDER — DIPHENHYDRAMINE HCL 50 MG/ML IJ SOLN
INTRAMUSCULAR | Status: AC
  Filled 2013-10-27: qty 1

## 2013-10-27 MED ORDER — FAMOTIDINE IN NACL 20-0.9 MG/50ML-% IV SOLN
20.0000 mg | Freq: Once | INTRAVENOUS | Status: AC
  Administered 2013-10-27: 20 mg via INTRAVENOUS

## 2013-10-27 MED ORDER — PACLITAXEL CHEMO INJECTION 300 MG/50ML
45.0000 mg/m2 | Freq: Once | INTRAVENOUS | Status: AC
  Administered 2013-10-27: 96 mg via INTRAVENOUS
  Filled 2013-10-27: qty 16

## 2013-10-27 MED ORDER — SODIUM CHLORIDE 0.9 % IV SOLN
Freq: Once | INTRAVENOUS | Status: AC
  Administered 2013-10-27: 10:00:00 via INTRAVENOUS

## 2013-10-27 MED ORDER — ONDANSETRON 16 MG/50ML IVPB (CHCC)
INTRAVENOUS | Status: AC
  Filled 2013-10-27: qty 16

## 2013-10-27 MED ORDER — ONDANSETRON 16 MG/50ML IVPB (CHCC)
16.0000 mg | Freq: Once | INTRAVENOUS | Status: AC
  Administered 2013-10-27: 16 mg via INTRAVENOUS

## 2013-10-27 MED ORDER — DEXAMETHASONE SODIUM PHOSPHATE 20 MG/5ML IJ SOLN
20.0000 mg | Freq: Once | INTRAMUSCULAR | Status: AC
  Administered 2013-10-27: 20 mg via INTRAVENOUS

## 2013-10-27 MED ORDER — FAMOTIDINE IN NACL 20-0.9 MG/50ML-% IV SOLN
INTRAVENOUS | Status: AC
  Filled 2013-10-27: qty 50

## 2013-10-27 MED ORDER — SODIUM CHLORIDE 0.9 % IV SOLN
189.6000 mg | Freq: Once | INTRAVENOUS | Status: AC
  Administered 2013-10-27: 190 mg via INTRAVENOUS
  Filled 2013-10-27: qty 19

## 2013-10-27 MED ORDER — RADIAPLEXRX EX GEL
Freq: Once | CUTANEOUS | Status: AC
  Administered 2013-10-27: 13:00:00 via TOPICAL

## 2013-10-27 NOTE — Patient Instructions (Signed)
[facility] Center Discharge Instructions for Patients Receiving Chemotherapy  Today you received the following chemotherapy agents TAXOL, CARBOPLATIN  To help prevent nausea and vomiting after your treatment, we encourage you to take your nausea medication IF NEEDED   If you develop nausea and vomiting that is not controlled by your nausea medication, call the clinic.   BELOW ARE SYMPTOMS THAT SHOULD BE REPORTED IMMEDIATELY:  *FEVER GREATER THAN 100.5 F  *CHILLS WITH OR WITHOUT FEVER  NAUSEA AND VOMITING THAT IS NOT CONTROLLED WITH YOUR NAUSEA MEDICATION  *UNUSUAL SHORTNESS OF BREATH  *UNUSUAL BRUISING OR BLEEDING  TENDERNESS IN MOUTH AND THROAT WITH OR WITHOUT PRESENCE OF ULCERS  *URINARY PROBLEMS  *BOWEL PROBLEMS  UNUSUAL RASH Items with * indicate a potential emergency and should be followed up as soon as possible.  Feel free to call the clinic you have any questions or concerns. The clinic phone number is [phone].    

## 2013-10-28 ENCOUNTER — Ambulatory Visit
Admission: RE | Admit: 2013-10-28 | Discharge: 2013-10-28 | Disposition: A | Discharge status: Home / Self Care | Payer: Medicare Other | Source: Ambulatory Visit | Attending: Radiation Oncology | Admitting: Radiation Oncology

## 2013-10-28 NOTE — Progress Notes (Signed)
[facility]    Radiation Oncology [address] [place]     Maryln Gottron, M.D. [place], [place] [address]               Billie Lade, M.D., Ph.D. Phone: [phone]      Molli Hazard A. Kathrynn Running, M.D. Fax: [phone]      Radene Gunning, M.D., Ph.D.         Lurline Hare, M.D.         [name] Jack, M.D Weekly Treatment Management Note  Name: NORBERT MALKIN     MRN: [id number]        CSN: [id number] Date: 10/28/2013      DOB: Feb 13, 1941  CC: [name], MD         [place]    Status: Outpatient  Diagnosis: The encounter diagnosis was Malignant neoplasm of upper lobe, bronchus or lung, right.  Current Dose: 48.6 Gy  Current Fraction: 27  Planned Dose: 63 Gy  Narrative: [name] was seen today for weekly treatment management. The chart was checked and CBCT  were reviewed. He continues to tolerate his treatments well this time. He has noticed some mild fatigue. He denies any changes in his breathing. He has minimal esophageal symptoms at this time.Marland Kitchen He does have some itching along the back region which is helped with hydrocortisone cream and radioplexus.  Review of patient's allergies indicates no known allergies. Current Outpatient Prescriptions  Medication Sig Dispense Refill  . carboxymethylcellulose (REFRESH PLUS) 0.5 % SOLN Place 1 drop into both eyes 3 (three) times daily as needed (dry eyes).      Marland Kitchen dextromethorphan (DELSYM) 30 MG/5ML liquid Take 60 mg by mouth as needed for cough.      . EXFORGE 5-160 MG per tablet TAKE ONE TABLET BY MOUTH ONCE DAILY.  30 tablet  9  . hyaluronate sodium (RADIAPLEXRX) GEL Apply 1 application topically 2 (two) times daily.      . hydroxychloroquine (PLAQUENIL) 200 MG tablet Take 200 mg by mouth 2 (two) times daily.        . metFORMIN (GLUCOPHAGE) 500 MG tablet Take 500 mg by mouth 2 (two) times daily with a meal.        . metoprolol succinate (TOPROL-XL) 25 MG 24 hr tablet Take 25 mg by mouth daily.      Marland Kitchen  omeprazole (PRILOSEC) 20 MG capsule Take 20 mg by mouth every morning.       . pravastatin (PRAVACHOL) 20 MG tablet TAKE ONE TABLET DAILY.  30 tablet  3  . prochlorperazine (COMPAZINE) 10 MG tablet Take 1 tablet (10 mg total) by mouth every 6 (six) hours as needed.  30 tablet  1  . sitaGLIPtin (JANUVIA) 100 MG tablet Take 100 mg by mouth every morning.       . sucralfate (CARAFATE) 1 GM/10ML suspension Take 10 mLs (1 g total) by mouth 4 (four) times daily.  420 mL  1   No current facility-administered medications for this encounter.   Labs:  Lab Results  Component Value Date   WBC 5.8 10/27/2013   HGB 12.0* 10/27/2013   HCT 35.7* 10/27/2013   MCV 89.0 10/27/2013   PLT 218 10/27/2013   Lab Results  Component Value Date   CREATININE 0.9 10/27/2013   BUN 11.4 10/27/2013   NA 137 10/27/2013   K 4.5 10/27/2013   CL 98 09/04/2013   CO2 22 10/27/2013   Lab Results  Component Value Date  ALT 10 10/27/2013   AST 10 10/27/2013   BILITOT 0.31 10/27/2013    Physical Examination:  Filed Vitals:   10/28/13 0930  BP: 103/90  Pulse: 105  Temp: 97.7 F (36.5 C)    Wt Readings from Last 3 Encounters:  10/28/13 205 lb 6.4 oz (93.169 kg)  10/21/13 203 lb 14.4 oz (92.488 kg)  10/14/13 203 lb 12.8 oz (92.443 kg)    Back area shows erythema and hyperpigmentation changes but no moist desquamation. Lungs - Normal respiratory effort, chest expands symmetrically. Lungs are clear to auscultation, no crackles or wheezes.  Heart has regular rhythm and rate  Abdomen is soft and non tender with normal bowel sounds  Assessment:  Patient tolerating treatments well  Plan: Continue treatment per original radiation prescription

## 2013-10-28 NOTE — Progress Notes (Signed)
David [name] here for weekly under treat visit.  He has had 27 fractions to his right chest.  He denies pain.  He reports that his throat is "a little tight."  He does have a cough which he thinks is from sinus drainage.  He has shortness of breath but is not any worse than before radiation.  He is fatigued.  The skin on his right upper back is red and dry.  He is using radiaplex and hydrocortisone cream.

## 2013-10-29 ENCOUNTER — Ambulatory Visit
Admission: RE | Admit: 2013-10-29 | Discharge: 2013-10-29 | Disposition: A | Discharge status: Home / Self Care | Payer: Medicare Other | Source: Ambulatory Visit | Attending: Radiation Oncology | Admitting: Radiation Oncology

## 2013-11-03 ENCOUNTER — Other Ambulatory Visit: Payer: Self-pay | Admitting: Cardiovascular Disease

## 2013-11-03 ENCOUNTER — Ambulatory Visit (HOSPITAL_BASED_OUTPATIENT_CLINIC_OR_DEPARTMENT_OTHER): Payer: Medicare Other

## 2013-11-03 ENCOUNTER — Ambulatory Visit
Admission: RE | Admit: 2013-11-03 | Discharge: 2013-11-03 | Disposition: A | Discharge status: Home / Self Care | Payer: Medicare Other | Source: Ambulatory Visit | Attending: Radiation Oncology | Admitting: Radiation Oncology

## 2013-11-03 ENCOUNTER — Ambulatory Visit (HOSPITAL_BASED_OUTPATIENT_CLINIC_OR_DEPARTMENT_OTHER): Payer: Medicare Other | Admitting: Physician Assistant

## 2013-11-03 ENCOUNTER — Other Ambulatory Visit (HOSPITAL_BASED_OUTPATIENT_CLINIC_OR_DEPARTMENT_OTHER): Payer: Medicare Other | Admitting: Lab

## 2013-11-03 ENCOUNTER — Telehealth: Payer: Self-pay | Admitting: Internal Medicine

## 2013-11-03 ENCOUNTER — Encounter: Payer: Self-pay | Admitting: Physician Assistant

## 2013-11-03 DIAGNOSIS — C3410 Malignant neoplasm of upper lobe, unspecified bronchus or lung: Secondary | ICD-10-CM

## 2013-11-03 DIAGNOSIS — C3491 Malignant neoplasm of unspecified part of right bronchus or lung: Secondary | ICD-10-CM

## 2013-11-03 DIAGNOSIS — R5381 Other malaise: Secondary | ICD-10-CM

## 2013-11-03 DIAGNOSIS — Z5111 Encounter for antineoplastic chemotherapy: Secondary | ICD-10-CM

## 2013-11-03 LAB — COMPREHENSIVE METABOLIC PANEL (CC13)
AST: 15 U/L (ref 5–34)
Albumin: 3 g/dL — ABNORMAL LOW (ref 3.5–5.0)
Alkaline Phosphatase: 65 U/L (ref 40–150)
BUN: 14.9 mg/dL (ref 7.0–26.0)
Calcium: 9 mg/dL (ref 8.4–10.4)
Chloride: 106 mEq/L (ref 98–109)
Creatinine: 0.8 mg/dL (ref 0.7–1.3)
Glucose: 187 mg/dl — ABNORMAL HIGH (ref 70–140)
Potassium: 4.3 mEq/L (ref 3.5–5.1)
Total Protein: 6.4 g/dL (ref 6.4–8.3)

## 2013-11-03 LAB — CBC WITH DIFFERENTIAL/PLATELET
Basophils Absolute: 0 10*3/uL (ref 0.0–0.1)
EOS%: 0.2 % (ref 0.0–7.0)
HCT: 32.4 % — ABNORMAL LOW (ref 38.4–49.9)
HGB: 10.9 g/dL — ABNORMAL LOW (ref 13.0–17.1)
MCH: 29.8 pg (ref 27.2–33.4)
MCHC: 33.6 g/dL (ref 32.0–36.0)
MCV: 88.5 fL (ref 79.3–98.0)
MONO#: 0.3 10*3/uL (ref 0.1–0.9)
NEUT%: 85.3 % — ABNORMAL HIGH (ref 39.0–75.0)
WBC: 4.4 10*3/uL (ref 4.0–10.3)
lymph#: 0.4 10*3/uL — ABNORMAL LOW (ref 0.9–3.3)

## 2013-11-03 MED ORDER — ONDANSETRON 16 MG/50ML IVPB (CHCC)
INTRAVENOUS | Status: AC
  Filled 2013-11-03: qty 16

## 2013-11-03 MED ORDER — SODIUM CHLORIDE 0.9 % IV SOLN
Freq: Once | INTRAVENOUS | Status: AC
  Administered 2013-11-03: 10:00:00 via INTRAVENOUS

## 2013-11-03 MED ORDER — SODIUM CHLORIDE 0.9 % IV SOLN
230.0000 mg | Freq: Once | INTRAVENOUS | Status: AC
  Administered 2013-11-03: 230 mg via INTRAVENOUS
  Filled 2013-11-03: qty 23

## 2013-11-03 MED ORDER — DEXAMETHASONE SODIUM PHOSPHATE 20 MG/5ML IJ SOLN
20.0000 mg | Freq: Once | INTRAMUSCULAR | Status: AC
  Administered 2013-11-03: 20 mg via INTRAVENOUS

## 2013-11-03 MED ORDER — PACLITAXEL CHEMO INJECTION 300 MG/50ML
45.0000 mg/m2 | Freq: Once | INTRAVENOUS | Status: AC
  Administered 2013-11-03: 96 mg via INTRAVENOUS
  Filled 2013-11-03: qty 16

## 2013-11-03 MED ORDER — DEXAMETHASONE SODIUM PHOSPHATE 20 MG/5ML IJ SOLN
INTRAMUSCULAR | Status: AC
  Filled 2013-11-03: qty 5

## 2013-11-03 MED ORDER — ONDANSETRON 16 MG/50ML IVPB (CHCC)
16.0000 mg | Freq: Once | INTRAVENOUS | Status: AC
  Administered 2013-11-03: 16 mg via INTRAVENOUS

## 2013-11-03 MED ORDER — DIPHENHYDRAMINE HCL 50 MG/ML IJ SOLN
50.0000 mg | Freq: Once | INTRAMUSCULAR | Status: AC
  Administered 2013-11-03: 50 mg via INTRAVENOUS

## 2013-11-03 MED ORDER — DIPHENHYDRAMINE HCL 50 MG/ML IJ SOLN
INTRAMUSCULAR | Status: AC
  Filled 2013-11-03: qty 1

## 2013-11-03 MED ORDER — FAMOTIDINE IN NACL 20-0.9 MG/50ML-% IV SOLN
INTRAVENOUS | Status: AC
  Filled 2013-11-03: qty 50

## 2013-11-03 MED ORDER — FAMOTIDINE IN NACL 20-0.9 MG/50ML-% IV SOLN
20.0000 mg | Freq: Once | INTRAVENOUS | Status: AC
  Administered 2013-11-03: 20 mg via INTRAVENOUS

## 2013-11-03 NOTE — Progress Notes (Addendum)
[facility] Telephone:(336) [phone]   Fax:([phone]) [phone]  SHARED VISIT PROGRESS NOTE  [name], MD [address] [place] [address]  DIAGNOSIS: Stage IIIA (T2a, N2, M0) non-small cell lung cancer, squamous cell carcinoma presented with large right upper lobe mass as well as mediastinal lymphadenopathy diagnosed in October of 2014  PRIOR THERAPY: None  CURRENT THERAPY: Concurrent chemoradiation with weekly carboplatin for AUC of 2 and paclitaxel 45 mg/M2 status post 6 cycle.  CHEMOTHERAPY INTENT: Control/curative  CURRENT # OF CHEMOTHERAPY CYCLES: 7  CURRENT ANTIEMETICS: Zofran, dexamethasone and Compazine  CURRENT SMOKING STATUS: Former smoker  ORAL CHEMOTHERAPY AND CONSENT: None  CURRENT BISPHOSPHONATES USE: None  PAIN MANAGEMENT: 0/10  NARCOTICS INDUCED CONSTIPATION: None  LIVING WILL AND CODE STATUS: Full code   INTERVAL HISTORY: David [name] 72 y.o. male returns to the clinic today for followup visit. Overall he is tolerating his course of concurrent chemoradiation with the exception of mild fatigue and slight decreased energy level. He continues to have a good appetite. He has not had any problems with nausea or vomiting. He denied any issues with fever or chills diarrhea or constipation. He reports that his swallowing is doing fine with the Carafate as prescribed. He voiced no other specific complaints today. The patient denied having any significant chest pain, shortness of breath, cough or hemoptysis.  He has no significant weight loss or night sweats.  MEDICAL HISTORY: Past Medical History  Diagnosis Date  . Coronary artery disease     s/p PCI 2000 and single vessel CABG 2001  . Hypercholesterolemia   . GERD (gastroesophageal reflux disease)   . Tubular adenoma 2003  . COPD (chronic obstructive pulmonary disease)   . Diabetes mellitus   . Arthritis   . Allergic rhinitis   . Cancer     lung mass   . Hypertension     Dr cooper    ALLERGIES:  has No Known Allergies.  MEDICATIONS:  Current Outpatient Prescriptions  Medication Sig Dispense Refill  . carboxymethylcellulose (REFRESH PLUS) 0.5 % SOLN Place 1 drop into both eyes 3 (three) times daily as needed (dry eyes).      Marland Kitchen dextromethorphan (DELSYM) 30 MG/5ML liquid Take 60 mg by mouth as needed for cough.      . EXFORGE 5-160 MG per tablet TAKE ONE TABLET BY MOUTH ONCE DAILY.  30 tablet  9  . hyaluronate sodium (RADIAPLEXRX) GEL Apply 1 application topically 2 (two) times daily.      . hydroxychloroquine (PLAQUENIL) 200 MG tablet Take 200 mg by mouth 2 (two) times daily.        . metFORMIN (GLUCOPHAGE) 500 MG tablet Take 500 mg by mouth 2 (two) times daily with a meal.        . metoprolol succinate (TOPROL-XL) 25 MG 24 hr tablet Take 25 mg by mouth daily.      Marland Kitchen omeprazole (PRILOSEC) 20 MG capsule Take 20 mg by mouth every morning.       . pravastatin (PRAVACHOL) 20 MG tablet TAKE ONE TABLET DAILY.  30 tablet  3  . prochlorperazine (COMPAZINE) 10 MG tablet Take 1 tablet (10 mg total) by mouth every 6 (six) hours as needed.  30 tablet  1  . sitaGLIPtin (JANUVIA) 100 MG tablet Take 100 mg by mouth every morning.       . sucralfate (CARAFATE) 1 GM/10ML suspension Take 10 mLs (1 g total) by mouth 4 (  four) times daily.  420 mL  1   No current facility-administered medications for this visit.    SURGICAL HISTORY:  Past Surgical History  Procedure Laterality Date  . Left rotator cuff repair    . Left knee arthroscopy   x2    for meniscal tear  . Coronary stent placement  2001  . Right knee arthroscopy    . Bilateral carpal tunnel release    . Right shoulder arthroscopy    . Coronary artery bypass graft      occlusion of the stent and underwent one-vessel CABG in 2002  . Coronary angioplasty    . Colonoscopy  09/13/2011    Procedure: COLONOSCOPY;  Surgeon: Malissa Hippo, MD;  Location: [facility];  Service: Endoscopy;   Laterality: N/A;  . Cataract extraction w/phaco  09/25/2011    Procedure: CATARACT EXTRACTION PHACO AND INTRAOCULAR LENS PLACEMENT (IOC);  Surgeon: Gemma Payor;  Location: [facility] ORS;  Service: Ophthalmology;  Laterality: Left;  CDE:10.79  . Cataract extraction w/phaco  10/05/2011    Procedure: CATARACT EXTRACTION PHACO AND INTRAOCULAR LENS PLACEMENT (IOC);  Surgeon: Gemma Payor;  Location: [facility];  Service: Ophthalmology;  Laterality: Right;  CDE: 8.45  . Video bronchoscopy with endobronchial ultrasound N/A 09/08/2013    Procedure: VIDEO BRONCHOSCOPY WITH ENDOBRONCHIAL ULTRASOUND;  Surgeon: Delight Ovens, MD;  Location: [facility] OR;  Service: Thoracic;  Laterality: N/A;    REVIEW OF SYSTEMS:  Constitutional: positive for fatigue Eyes: negative Ears, nose, mouth, throat, and face: negative Respiratory: negative Cardiovascular: negative Gastrointestinal: negative Genitourinary:negative Integument/breast: negative Hematologic/lymphatic: negative Musculoskeletal:negative Neurological: negative Behavioral/Psych: negative Endocrine: negative Allergic/Immunologic: negative   PHYSICAL EXAMINATION: General appearance: alert, cooperative, fatigued and no distress Head: Normocephalic, without obvious abnormality, atraumatic Neck: no adenopathy, no JVD, supple, symmetrical, trachea midline and thyroid not enlarged, symmetric, no tenderness/mass/nodules Lymph nodes: Cervical, supraclavicular, and axillary nodes normal. Resp: clear to auscultation bilaterally Back: symmetric, no curvature. ROM normal. No CVA tenderness. Cardio: regular rate and rhythm, S1, S2 normal, no murmur, click, rub or gallop GI: soft, non-tender; bowel sounds normal; no masses,  no organomegaly Extremities: extremities normal, atraumatic, no cyanosis or edema Neurologic: Alert and oriented X 3, normal strength and tone. Normal symmetric reflexes. Normal coordination and gait  ECOG PERFORMANCE STATUS: 1 - Symptomatic but completely  ambulatory  Blood pressure 128/82, pulse 110, temperature 97 F (36.1 C), temperature source Oral, resp. rate 18, height 5\' 9"  (1.753 m), weight 206 lb 9.6 oz (93.713 kg), SpO2 97.00%.  LABORATORY DATA: Lab Results  Component Value Date   WBC 4.4 11/03/2013   HGB 10.9* 11/03/2013   HCT 32.4* 11/03/2013   MCV 88.5 11/03/2013   PLT 212 11/03/2013      Chemistry      Component Value Date/Time   NA 138 11/03/2013 0926   NA 135 09/04/2013 1419   K 4.3 11/03/2013 0926   K 4.3 09/04/2013 1419   CL 98 09/04/2013 1419   CO2 23 11/03/2013 0926   CO2 28 09/04/2013 1419   BUN 14.9 11/03/2013 0926   BUN 10 09/04/2013 1419   CREATININE 0.8 11/03/2013 0926   CREATININE 0.82 09/04/2013 1419      Component Value Date/Time   CALCIUM 9.0 11/03/2013 0926   CALCIUM 9.4 09/04/2013 1419   ALKPHOS 65 11/03/2013 0926   ALKPHOS 78 09/04/2013 1419   AST 15 11/03/2013 0926   AST 13 09/04/2013 1419   ALT 12 11/03/2013 0926   ALT 10 09/04/2013 1419  BILITOT 0.44 11/03/2013 0926   BILITOT 0.7 09/04/2013 1419       RADIOGRAPHIC STUDIES: Dg Chest 2 View Within Previous 72 Hours.  Films Obtained On Friday Are Acceptable For Monday And Tuesday Cases  09/08/2013   CLINICAL DATA:  Preoperative films for biopsy of lung mass.  EXAM: CHEST  2 VIEW  COMPARISON:  CT chest 08/19/2013. PET scan 09/02/2013.  FINDINGS: A posterior right upper lobe lung mass is again noted. Emphysematous changes are evident. No additional airspace disease is present. The heart size is normal. The visualized soft tissues and bony thorax are unremarkable.  IMPRESSION: 1. Posterior right upper lobe lung mass. 2. Emphysema. 3. No acute cardiopulmonary disease.   Electronically Signed   By: Gennette Pac M.D.   On: 09/08/2013 07:15   Mr Laqueta Jean AO Contrast  09/02/2013   CLINICAL DATA:  72 year old male with recent diagnosis of lung mass. Staging. Initial encounter.  BUN and creatinine were obtained on site at [facility] at  [address]   Results:  BUN 12 mg/dL,  Creatinine 0.9 mg/dL.  Estimated GFR 83.  EXAM: MRI HEAD WITHOUT AND WITH CONTRAST  TECHNIQUE: Multiplanar, multiecho pulse sequences of the brain and surrounding structures were obtained according to standard protocol without and with intravenous contrast  CONTRAST:  19mL MULTIHANCE GADOBENATE DIMEGLUMINE 529 MG/ML IV SOLN  COMPARISON:  Paranasal sinus CT 04/10/2006.  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 No abnormal enhancement identified. No midline shift, mass effect, or intracranial mass lesion identified.  No restricted diffusion to suggest acute infarction. No ventriculomegaly, extra-axial collection or acute intracranial hemorrhage. Cervicomedullary junction and pituitary are within normal limits. Negative visualized cervical spine. Normal bone marrow signal.  Minimal to mild for age scattered nonspecific cerebral white matter T2 and FLAIR hyperintensity. Normal visualized internal auditory structures. Trace left mastoid fluid.  Postoperative changes to the globes. Mild ethmoid sinus mucosal thickening. Negative scalp soft tissues.  IMPRESSION: No acute or metastatic intracranial abnormality. Negative for age MRI appearance of the brain.   Electronically Signed   By: Augusto Gamble M.D.   On: 09/02/2013 15:51   Nm Pet Image Initial (pi) Skull Base To Thigh  09/02/2013   CLINICAL DATA:  Initial treatment strategy for right lung mass.  EXAM: NUCLEAR MEDICINE PET SKULL BASE TO THIGH  FASTING BLOOD GLUCOSE:  Value:  207 mg/dl  TECHNIQUE: 13.0 mCi Q-65 FDG was injected intravenously. CT data was obtained and used for attenuation correction and anatomic localization only. (This was not acquired as a diagnostic CT examination.) Additional exam technical data entered on technologist worksheet.  COMPARISON:  Chest CT on 08/19/2013  FINDINGS: NECK  No hypermetabolic lymph nodes in the neck.  CHEST  Hypermetabolic mass is seen in the central right  upper lobe which involves the right hilum. This has a maximum SUV of 18.1. Mild hypermetabolic postobstructive pneumonitis seen in the peripheral right upper lobe.  No hypermetabolic mediastinal or left hilar lymph nodes are identified.  ABDOMEN/PELVIS  No abnormal hypermetabolic activity within the liver, pancreas, adrenal glands, or spleen. No hypermetabolic lymph nodes in the abdomen or pelvis.  SKELETON  No focal hypermetabolic activity to suggest skeletal metastasis.  IMPRESSION: Hypermetabolic central right upper lobe mass with involvement of the right hilum. Mild postobstructive pneumonitis noted in the peripheral right upper lobe.  No evidence hypermetabolic mediastinal lymph nodes or extrathoracic metastatic disease.   Electronically Signed   By: Myles Rosenthal   On:  09/02/2013 16:21    ASSESSMENT AND PLAN: This is a very pleasant 72 years old white male recently diagnosed with a stage IIIa non-small cell lung cancer.He is currently undergoing concurrent chemoradiation with weekly carboplatin and paclitaxel is status post 6 cycles. Overall is tolerating his course of concurrent chemoradiation relatively well with the exception of some mild fatigue. The patient was discussed with also seen by Dr. Arbutus Ped. He'll complete his course of concurrent chemoradiation as scheduled. He'll followup with Dr. Arbutus Ped in approximately 5-6 weeks with a restaging CT scan of the chest with contrast to reevaluate his disease. He'll followup 3 weeks for another symptom management visit.  Arrian Manson E, PA-C .  The patient voices understanding of current disease status and treatment options and is in agreement with the current care plan.  All questions were answered. The patient knows to call the clinic with any problems, questions or concerns. We can certainly see the patient much sooner if necessary.  ADDENDUM: Hematology/Oncology Attending: I had a face to face encounter with the patient. I recommended for his  care plan. The patient is a very pleasant 72 years old white male with history of stage IIIa non-small cell lung cancer who is currently undergoing concurrent chemoradiation with weekly carboplatin and paclitaxel status post 6 cycles and tolerating his treatment fairly well. We will proceed with the last cycle of his treatment today as scheduled. The patient would come back for follow up visit in 6 weeks with repeat CT scan of the chest for restaging of his disease. He was advised to call immediately if he has any concerning symptoms in the interval. Lajuana Matte., MD 11/05/2013

## 2013-11-03 NOTE — Telephone Encounter (Signed)
appts made per 12/1 POF AVS and CAL and barium given to pt shh

## 2013-11-03 NOTE — Patient Instructions (Signed)
[facility] Discharge Instructions for Patients Receiving Chemotherapy  Today you received the following chemotherapy agents: Taxol and Carboplatin.  To help prevent nausea and vomiting after your treatment, we encourage you to take your nausea medication as prescribed.   If you develop nausea and vomiting that is not controlled by your nausea medication, call the clinic.   BELOW ARE SYMPTOMS THAT SHOULD BE REPORTED IMMEDIATELY:  *FEVER GREATER THAN 100.5 F  *CHILLS WITH OR WITHOUT FEVER  NAUSEA AND VOMITING THAT IS NOT CONTROLLED WITH YOUR NAUSEA MEDICATION  *UNUSUAL SHORTNESS OF BREATH  *UNUSUAL BRUISING OR BLEEDING  TENDERNESS IN MOUTH AND THROAT WITH OR WITHOUT PRESENCE OF ULCERS  *URINARY PROBLEMS  *BOWEL PROBLEMS  UNUSUAL RASH Items with * indicate a potential emergency and should be followed up as soon as possible.  Feel free to call the clinic you have any questions or concerns. The clinic phone number is [phone].    

## 2013-11-04 ENCOUNTER — Ambulatory Visit: Payer: Medicare Other

## 2013-11-04 ENCOUNTER — Ambulatory Visit: Payer: Medicare Other | Admitting: Radiation Oncology

## 2013-11-05 ENCOUNTER — Ambulatory Visit
Admission: RE | Admit: 2013-11-05 | Discharge: 2013-11-05 | Disposition: A | Discharge status: Home / Self Care | Payer: Medicare Other | Source: Ambulatory Visit | Attending: Radiation Oncology | Admitting: Radiation Oncology

## 2013-11-05 MED ORDER — BIAFINE EX EMUL
CUTANEOUS | Status: DC | PRN
  Administered 2013-11-05: 10:00:00 via TOPICAL

## 2013-11-05 MED ORDER — RADIAPLEXRX EX GEL: Freq: Once | CUTANEOUS | Status: DC

## 2013-11-05 NOTE — Patient Instructions (Signed)
Complete her course of radiation therapy as scheduled Followup with Dr. Arbutus Ped and 5-6 weeks after completing her radiation therapy with a restaging CT scan of your chest to reevaluate your disease.

## 2013-11-05 NOTE — Progress Notes (Signed)
David [name] here for weekly under treat visit.  He has had 29 fractions to his right chest.  He denies a cough.  He reports that his shortness of breath is about the same.  He denies a sore throat.  He has noticed that he gets tired quicker.  The skin on his right chest is red and the skin on his right upper back is red and dry with a small amount of peeling.  He is using radiaplex gel twice a day and hydrocortisone as needed.  He reported that the hydrocortisone burned today when he applied it.

## 2013-11-05 NOTE — Progress Notes (Signed)
[facility]    Radiation Oncology [address] [place]     Maryln Gottron, M.D. [place], [place] [address]               Billie Lade, M.D., Ph.D. Phone: [phone]      Molli Hazard A. Kathrynn Running, M.D. Fax: [phone]      Radene Gunning, M.D., Ph.D.         Lurline Hare, M.D.         [name] Jack, M.D Weekly Treatment Management Note  Name: David [name]     MRN: [id number]        CSN: [id number] Date: 11/05/2013      DOB: 10/31/1941  CC: [name], MD         [place]    Status: Outpatient  Diagnosis: Stage IIIa (T2a, N2, M0) squamous cell carcinoma of the lung   Current Dose: 52.2 Gy  Current Fraction: 29  Planned Dose: 63 Gy  Narrative: [name] was seen today for weekly treatment management. The chart was checked and CBCT  were reviewed. he overall continues to tolerate his treatments well. He has noticed more fatigue but denies any swallowing difficulties. He continues to work his usual schedule. He has had some itching in the treatment area and I have switched him to Biafine for his skin.  Review of patient's allergies indicates no known allergies. Current Outpatient Prescriptions  Medication Sig Dispense Refill  . carboxymethylcellulose (REFRESH PLUS) 0.5 % SOLN Place 1 drop into both eyes 3 (three) times daily as needed (dry eyes).      Marland Kitchen emollient (BIAFINE) cream Apply topically 2 (two) times daily.      Marland Kitchen EXFORGE 5-160 MG per tablet TAKE ONE TABLET BY MOUTH ONCE DAILY.  30 tablet  9  . hyaluronate sodium (RADIAPLEXRX) GEL Apply 1 application topically 2 (two) times daily.      . hydroxychloroquine (PLAQUENIL) 200 MG tablet Take 200 mg by mouth 2 (two) times daily.        . metFORMIN (GLUCOPHAGE) 500 MG tablet Take 500 mg by mouth 2 (two) times daily with a meal.        . metoprolol succinate (TOPROL-XL) 25 MG 24 hr tablet Take 25 mg by mouth daily.      Marland Kitchen omeprazole (PRILOSEC) 20 MG capsule Take 20 mg by mouth every morning.        . pravastatin (PRAVACHOL) 20 MG tablet TAKE ONE TABLET DAILY.  30 tablet  3  . prochlorperazine (COMPAZINE) 10 MG tablet Take 1 tablet (10 mg total) by mouth every 6 (six) hours as needed.  30 tablet  1  . sitaGLIPtin (JANUVIA) 100 MG tablet Take 100 mg by mouth every morning.       . sucralfate (CARAFATE) 1 GM/10ML suspension Take 10 mLs (1 g total) by mouth 4 (four) times daily.  420 mL  1  . dextromethorphan (DELSYM) 30 MG/5ML liquid Take 60 mg by mouth as needed for cough.       Current Facility-Administered Medications  Medication Dose Route Frequency Provider Last Rate Last Dose  . topical emolient (BIAFINE) emulsion   Topical PRN Billie Lade, MD       Labs:  Lab Results  Component Value Date   WBC 4.4 11/03/2013   HGB 10.9* 11/03/2013   HCT 32.4* 11/03/2013   MCV 88.5 11/03/2013   PLT 212 11/03/2013   Lab Results  Component Value Date   CREATININE  0.8 11/03/2013   BUN 14.9 11/03/2013   NA 138 11/03/2013   K 4.3 11/03/2013   CL 98 09/04/2013   CO2 23 11/03/2013   Lab Results  Component Value Date   ALT 12 11/03/2013   AST 15 11/03/2013   BILITOT 0.44 11/03/2013    Physical Examination:  Filed Vitals:   11/05/13 0949  BP: 134/80  Pulse: 95  Temp: 97.5 F (36.4 C)    Wt Readings from Last 3 Encounters:  11/05/13 209 lb (94.802 kg)  11/03/13 206 lb 9.6 oz (93.713 kg)  10/28/13 205 lb 6.4 oz (93.169 kg)    The skin of the upper right back area shows erythema and some dry desquamation but no moist desquamation. Lungs - Normal respiratory effort, chest expands symmetrically. Lungs are clear to auscultation, no crackles or wheezes.  Heart has regular rhythm and rate  Abdomen is soft and non tender with normal bowel sounds  Assessment:  Patient tolerating treatments well  Plan: Continue treatment per original radiation prescription

## 2013-11-06 ENCOUNTER — Ambulatory Visit
Admission: RE | Admit: 2013-11-06 | Discharge: 2013-11-06 | Disposition: A | Discharge status: Home / Self Care | Payer: Medicare Other | Source: Ambulatory Visit | Attending: Radiation Oncology | Admitting: Radiation Oncology

## 2013-11-07 ENCOUNTER — Ambulatory Visit
Admission: RE | Admit: 2013-11-07 | Discharge: 2013-11-07 | Disposition: A | Discharge status: Home / Self Care | Payer: Medicare Other | Source: Ambulatory Visit | Attending: Radiation Oncology | Admitting: Radiation Oncology

## 2013-11-10 ENCOUNTER — Other Ambulatory Visit (HOSPITAL_BASED_OUTPATIENT_CLINIC_OR_DEPARTMENT_OTHER): Payer: Medicare Other | Admitting: Lab

## 2013-11-10 ENCOUNTER — Ambulatory Visit
Admission: RE | Admit: 2013-11-10 | Discharge: 2013-11-10 | Disposition: A | Discharge status: Home / Self Care | Payer: Medicare Other | Source: Ambulatory Visit | Attending: Radiation Oncology | Admitting: Radiation Oncology

## 2013-11-10 DIAGNOSIS — C3410 Malignant neoplasm of upper lobe, unspecified bronchus or lung: Secondary | ICD-10-CM

## 2013-11-10 DIAGNOSIS — C3491 Malignant neoplasm of unspecified part of right bronchus or lung: Secondary | ICD-10-CM

## 2013-11-10 LAB — CBC WITH DIFFERENTIAL/PLATELET
BASO%: 0.3 % (ref 0.0–2.0)
EOS%: 0.1 % (ref 0.0–7.0)
Eosinophils Absolute: 0 10*3/uL (ref 0.0–0.5)
HCT: 34.3 % — ABNORMAL LOW (ref 38.4–49.9)
MCHC: 34.1 g/dL (ref 32.0–36.0)
MCV: 92.2 fL (ref 79.3–98.0)
MONO%: 10.2 % (ref 0.0–14.0)
NEUT#: 3.7 10*3/uL (ref 1.5–6.5)
NEUT%: 86.1 % — ABNORMAL HIGH (ref 39.0–75.0)
Platelets: 242 10*3/uL (ref 140–400)
RBC: 3.72 10*6/uL — ABNORMAL LOW (ref 4.20–5.82)
RDW: 15.6 % — ABNORMAL HIGH (ref 11.0–14.6)

## 2013-11-10 LAB — COMPREHENSIVE METABOLIC PANEL (CC13)
ALT: 10 U/L (ref 0–55)
AST: 13 U/L (ref 5–34)
Albumin: 3 g/dL — ABNORMAL LOW (ref 3.5–5.0)
Alkaline Phosphatase: 66 U/L (ref 40–150)
CO2: 25 mEq/L (ref 22–29)
Creatinine: 0.9 mg/dL (ref 0.7–1.3)
Potassium: 4.7 mEq/L (ref 3.5–5.1)
Sodium: 136 mEq/L (ref 136–145)
Total Bilirubin: 0.39 mg/dL (ref 0.20–1.20)
Total Protein: 6.5 g/dL (ref 6.4–8.3)

## 2013-11-11 ENCOUNTER — Ambulatory Visit: Payer: Medicare Other | Admitting: Radiation Oncology

## 2013-11-11 ENCOUNTER — Encounter: Payer: Self-pay | Admitting: Radiation Oncology

## 2013-11-11 ENCOUNTER — Ambulatory Visit
Admission: RE | Admit: 2013-11-11 | Discharge: 2013-11-11 | Disposition: A | Discharge status: Home / Self Care | Payer: Medicare Other | Source: Ambulatory Visit | Attending: Radiation Oncology | Admitting: Radiation Oncology

## 2013-11-11 MED ORDER — BIAFINE EX EMUL
CUTANEOUS | Status: DC | PRN
  Administered 2013-11-11: 09:00:00 via TOPICAL

## 2013-11-11 NOTE — Progress Notes (Addendum)
Weekly rad txs lung rt, 34/35 completed, gave another biafine cream tube, on back, dry desquamation, erythema, on front mild erythema only, no coughing, carafte 4x day helping throat, eating good, energy level pretty good stated, tires sometimes, gets sob with activity(cutting wood) Gave 1 month f/u appt card 9:11 AM

## 2013-11-11 NOTE — Progress Notes (Signed)
[facility]    Radiation Oncology [address] [name]     Maryln Gottron, M.D. [place], [place] [address]               Billie Lade, M.D., Ph.D. Phone: [phone]      Molli Hazard A. Kathrynn Running, M.D. Fax: [phone]      Radene Gunning, M.D., Ph.D.         Lurline Hare, M.D.         [name] Jack, M.D Weekly Treatment Management Note  Name: David [name]     MRN: [id number]        CSN: [id number] Date: 11/11/2013      DOB: 14-Jul-1941  CC: [name], MD         [place]    Status: Outpatient  Diagnosis: The encounter diagnosis was Malignant neoplasm of upper lobe, bronchus or lung, right.  Current Dose: 61.2 Gy  Current Fraction: 34  Planned Dose: 63 Gy  Narrative: David [name] was seen today for weekly treatment management. The chart was checked and CBCT  were reviewed. He does have some mild fatigue but denies any significant swallowing problems. He complains of a burning or itching sensation along the right upper back region. He is using Biafine for his skin.  Review of patient's allergies indicates no known allergies.  Current Outpatient Prescriptions  Medication Sig Dispense Refill  . carboxymethylcellulose (REFRESH PLUS) 0.5 % SOLN Place 1 drop into both eyes 3 (three) times daily as needed (dry eyes).      Marland Kitchen dextromethorphan (DELSYM) 30 MG/5ML liquid Take 60 mg by mouth as needed for cough.      . emollient (BIAFINE) cream Apply topically 2 (two) times daily.      Marland Kitchen EXFORGE 5-160 MG per tablet TAKE ONE TABLET BY MOUTH ONCE DAILY.  30 tablet  9  . hyaluronate sodium (RADIAPLEXRX) GEL Apply 1 application topically 2 (two) times daily.      . hydroxychloroquine (PLAQUENIL) 200 MG tablet Take 200 mg by mouth 2 (two) times daily.        . metFORMIN (GLUCOPHAGE) 500 MG tablet Take 500 mg by mouth 2 (two) times daily with a meal.        . metoprolol succinate (TOPROL-XL) 25 MG 24 hr tablet TAKE 1 TABLET BY MOUTH ONCE A DAY.  30 tablet  5  .  omeprazole (PRILOSEC) 20 MG capsule Take 20 mg by mouth every morning.       . pravastatin (PRAVACHOL) 20 MG tablet TAKE ONE TABLET DAILY.  30 tablet  3  . prochlorperazine (COMPAZINE) 10 MG tablet Take 1 tablet (10 mg total) by mouth every 6 (six) hours as needed.  30 tablet  1  . sitaGLIPtin (JANUVIA) 100 MG tablet Take 100 mg by mouth every morning.       . sucralfate (CARAFATE) 1 GM/10ML suspension Take 10 mLs (1 g total) by mouth 4 (four) times daily.  420 mL  1   Current Facility-Administered Medications  Medication Dose Route Frequency Provider Last Rate Last Dose  . topical emolient (BIAFINE) emulsion   Topical PRN Billie Lade, MD       Labs:  Lab Results  Component Value Date   WBC 4.3 11/10/2013   HGB 11.7* 11/10/2013   HCT 34.3* 11/10/2013   MCV 92.2 11/10/2013   PLT 242 11/10/2013   Lab Results  Component Value Date   CREATININE 0.9 11/10/2013   BUN 11.5  11/10/2013   NA 136 11/10/2013   K 4.7 11/10/2013   CL 98 09/04/2013   CO2 25 11/10/2013   Lab Results  Component Value Date   ALT 10 11/10/2013   AST 13 11/10/2013   BILITOT 0.39 11/10/2013    Physical Examination:  Filed Vitals:   11/11/13 0911  BP: 114/62  Pulse: 98  Temp: 97.8 F (36.6 C)  Resp: 20    Wt Readings from Last 3 Encounters:  11/11/13 207 lb 11.2 oz (94.212 kg)  11/05/13 209 lb (94.802 kg)  11/03/13 206 lb 9.6 oz (93.713 kg)    The right upper back area shows dry desquamation and  erythema but no moist desquamation Lungs - Normal respiratory effort, chest expands symmetrically. Lungs are clear to auscultation, no crackles or wheezes.  Heart has regular rhythm and rate  Abdomen is soft and non tender with normal bowel sounds  Assessment:  Patient tolerating treatments well except for above skin reaction  Plan: Continue treatment per original radiation prescription.

## 2013-11-12 ENCOUNTER — Ambulatory Visit
Admission: RE | Admit: 2013-11-12 | Discharge: 2013-11-12 | Disposition: A | Discharge status: Home / Self Care | Payer: Medicare Other | Source: Ambulatory Visit | Attending: Radiation Oncology | Admitting: Radiation Oncology

## 2013-11-12 ENCOUNTER — Ambulatory Visit: Payer: Medicare Other

## 2013-11-23 ENCOUNTER — Encounter: Payer: Self-pay | Admitting: Radiation Oncology

## 2013-11-23 NOTE — Progress Notes (Signed)
  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   He had some fatigue toward since his therapy as well some mild skin irritation along the back region. He denied any significant swallowing problems or breathing problems during the course of his treatment.  Plan: The patient has completed radiation treatment. The patient will return to radiation oncology clinic for routine followup in one month. I advised them to call or return sooner if they have any questions or concerns related to their recovery or treatment.  -----------------------------------  Billie Lade, PhD, MD

## 2013-12-08 ENCOUNTER — Encounter: Payer: Self-pay | Admitting: Internal Medicine

## 2013-12-08 NOTE — Progress Notes (Signed)
Received fax with [employer] card. Card entered.

## 2013-12-10 ENCOUNTER — Other Ambulatory Visit: Payer: Self-pay | Admitting: *Deleted

## 2013-12-17 ENCOUNTER — Encounter: Payer: Self-pay | Admitting: Oncology

## 2013-12-18 ENCOUNTER — Ambulatory Visit
Admission: RE | Admit: 2013-12-18 | Discharge: 2013-12-18 | Disposition: A | Discharge status: Home / Self Care | Payer: Medicare Other | Source: Ambulatory Visit | Attending: Radiation Oncology | Admitting: Radiation Oncology

## 2013-12-18 ENCOUNTER — Encounter: Payer: Self-pay | Admitting: Radiation Oncology

## 2013-12-18 VITALS — BP 126/75 | HR 114 | Temp 97.7°F | Ht 69.0 in | Wt 209.1 lb

## 2013-12-18 DIAGNOSIS — C3410 Malignant neoplasm of upper lobe, unspecified bronchus or lung: Secondary | ICD-10-CM

## 2013-12-18 NOTE — Progress Notes (Signed)
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  He continues to have some fatigue but this issue is improving. He has some dyspnea with exertion but is able to work  full-time.  He has had some intermittent hemoptysis but feels this blood is coming from his nasal cavity. He denies any pain in the chest area. He did have some mild skin reaction in the back area which has now cleared up.                              ALLERGIES:  has No Known Allergies.  Meds: Current Outpatient Prescriptions  Medication Sig Dispense Refill  . carboxymethylcellulose (REFRESH PLUS) 0.5 % SOLN Place 1 drop into both eyes 3 (three) times daily as needed (dry eyes).      . EXFORGE 5-160 MG per tablet TAKE ONE TABLET BY MOUTH ONCE DAILY.  30 tablet  9  . hydroxychloroquine (PLAQUENIL) 200 MG tablet Take 200 mg by mouth 2 (two) times daily.        . metFORMIN (GLUCOPHAGE) 500 MG tablet Take 500 mg by mouth 2 (two) times daily with a meal.        . metoprolol succinate (TOPROL-XL) 25 MG 24 hr tablet TAKE 1 TABLET BY MOUTH ONCE A DAY.  30 tablet  5  . omeprazole (PRILOSEC) 20 MG capsule Take 20 mg by mouth every morning.       . pravastatin (PRAVACHOL) 20 MG tablet TAKE ONE TABLET DAILY.  30 tablet  3  . sitaGLIPtin (JANUVIA) 100 MG tablet Take 100 mg by mouth every morning.       Marland Kitchen dextromethorphan (DELSYM) 30 MG/5ML liquid Take 60 mg by mouth as needed for cough.      . emollient (BIAFINE) cream Apply topically 2 (two) times daily.      . hyaluronate sodium (RADIAPLEXRX) GEL Apply 1 application topically 2 (two) times daily.      . prochlorperazine  (COMPAZINE) 10 MG tablet Take 1 tablet (10 mg total) by mouth every 6 (six) hours as needed.  30 tablet  1  . sucralfate (CARAFATE) 1 GM/10ML suspension Take 10 mLs (1 g total) by mouth 4 (four) times daily.  420 mL  1   No current facility-administered medications for this encounter.    Physical Findings: The patient is in no acute distress. Patient is alert and oriented.  height is 5\' 9"  (1.753 m) and weight is 209 lb 1.6 oz (94.847 kg). His temperature is 97.7 F (36.5 C). His blood pressure is 126/75 and his pulse is 114. His oxygen saturation is 96%. .  No palpable supraclavicular adenopathy. The lungs are clear to auscultation. The heart has a regular rhythm and rate. Some erythema in the back area but no skin breakdown.  Lab Findings: Lab Results  Component Value Date   WBC 4.3 11/10/2013   HGB 11.7* 11/10/2013   HCT 34.3* 11/10/2013   MCV 92.2 11/10/2013   PLT 242 11/10/2013     Radiographic Findings: Chest CT scan scheduled for tomorrow  Impression:  The patient is recovering from the effects of radiation.    Plan:  Routine followup in 3 months. Patient will undergo chest CT scan tomorrow and then seen by medical oncology early next week for treatment planning i.e. surgery or additional chemotherapy.  ____________________________________ Blair Promise, MD

## 2013-12-18 NOTE — Progress Notes (Signed)
David [name] here for follow up after treatment to his right central chest.  He denies pain.  He reports coughing/spiting up blood for the past 4-5 days.  He said he notices gurgling in the morning in his throat and thinks his sinuses are draining.  He has a dry cough and does have shortness of breath with activity.  His oxygen saturation on room air today was 96%.  He denies a sore throat but says he occasionally has trouble swallowing.  He says that his swallowing has improved since the end of treatment.  He has fatigue.  His skin on his right chest is intact.  He has a small red area on the bottom of his right shoulder blade that is red.  He has stopped using biafine and used his son's silvadene to get the area on his back to heal.

## 2013-12-19 ENCOUNTER — Other Ambulatory Visit (HOSPITAL_BASED_OUTPATIENT_CLINIC_OR_DEPARTMENT_OTHER): Payer: Medicare HMO

## 2013-12-19 ENCOUNTER — Ambulatory Visit (HOSPITAL_COMMUNITY)
Admission: RE | Admit: 2013-12-19 | Discharge: 2013-12-19 | Disposition: A | Discharge status: Home / Self Care | Payer: Medicare HMO | Source: Ambulatory Visit | Attending: Physician Assistant | Admitting: Physician Assistant

## 2013-12-19 ENCOUNTER — Other Ambulatory Visit: Payer: Medicare Other | Admitting: Lab

## 2013-12-19 ENCOUNTER — Encounter (HOSPITAL_COMMUNITY): Payer: Self-pay

## 2013-12-19 DIAGNOSIS — Z9221 Personal history of antineoplastic chemotherapy: Secondary | ICD-10-CM | POA: Insufficient documentation

## 2013-12-19 DIAGNOSIS — J438 Other emphysema: Secondary | ICD-10-CM | POA: Insufficient documentation

## 2013-12-19 DIAGNOSIS — J479 Bronchiectasis, uncomplicated: Secondary | ICD-10-CM | POA: Insufficient documentation

## 2013-12-19 DIAGNOSIS — J8410 Pulmonary fibrosis, unspecified: Secondary | ICD-10-CM | POA: Insufficient documentation

## 2013-12-19 DIAGNOSIS — I7090 Unspecified atherosclerosis: Secondary | ICD-10-CM | POA: Insufficient documentation

## 2013-12-19 DIAGNOSIS — C3490 Malignant neoplasm of unspecified part of unspecified bronchus or lung: Secondary | ICD-10-CM | POA: Insufficient documentation

## 2013-12-19 DIAGNOSIS — Z923 Personal history of irradiation: Secondary | ICD-10-CM | POA: Insufficient documentation

## 2013-12-19 DIAGNOSIS — C3410 Malignant neoplasm of upper lobe, unspecified bronchus or lung: Secondary | ICD-10-CM

## 2013-12-19 DIAGNOSIS — R222 Localized swelling, mass and lump, trunk: Secondary | ICD-10-CM | POA: Insufficient documentation

## 2013-12-19 LAB — COMPREHENSIVE METABOLIC PANEL (CC13)
ALBUMIN: 3 g/dL — AB (ref 3.5–5.0)
ALT: 6 U/L (ref 0–55)
AST: 13 U/L (ref 5–34)
Alkaline Phosphatase: 83 U/L (ref 40–150)
Anion Gap: 8 mEq/L (ref 3–11)
BUN: 11.6 mg/dL (ref 7.0–26.0)
CALCIUM: 9.3 mg/dL (ref 8.4–10.4)
CHLORIDE: 105 meq/L (ref 98–109)
CO2: 26 mEq/L (ref 22–29)
Creatinine: 0.8 mg/dL (ref 0.7–1.3)
Glucose: 225 mg/dl — ABNORMAL HIGH (ref 70–140)
POTASSIUM: 4.5 meq/L (ref 3.5–5.1)
Sodium: 138 mEq/L (ref 136–145)
Total Bilirubin: 0.74 mg/dL (ref 0.20–1.20)
Total Protein: 7 g/dL (ref 6.4–8.3)

## 2013-12-19 LAB — CBC WITH DIFFERENTIAL/PLATELET
BASO%: 0.3 % (ref 0.0–2.0)
BASOS ABS: 0 10*3/uL (ref 0.0–0.1)
EOS%: 0.6 % (ref 0.0–7.0)
Eosinophils Absolute: 0 10*3/uL (ref 0.0–0.5)
HCT: 41.3 % (ref 38.4–49.9)
HGB: 13.9 g/dL (ref 13.0–17.1)
LYMPH#: 0.3 10*3/uL — AB (ref 0.9–3.3)
LYMPH%: 3.5 % — ABNORMAL LOW (ref 14.0–49.0)
MCH: 32.5 pg (ref 27.2–33.4)
MCHC: 33.7 g/dL (ref 32.0–36.0)
MCV: 96.4 fL (ref 79.3–98.0)
MONO#: 1.1 10*3/uL — AB (ref 0.1–0.9)
MONO%: 13.6 % (ref 0.0–14.0)
NEUT#: 6.7 10*3/uL — ABNORMAL HIGH (ref 1.5–6.5)
NEUT%: 82 % — ABNORMAL HIGH (ref 39.0–75.0)
Platelets: 258 10*3/uL (ref 140–400)
RBC: 4.29 10*6/uL (ref 4.20–5.82)
RDW: 16.6 % — AB (ref 11.0–14.6)
WBC: 8.2 10*3/uL (ref 4.0–10.3)

## 2013-12-19 MED ORDER — IOHEXOL 300 MG/ML  SOLN
80.0000 mL | Freq: Once | INTRAMUSCULAR | Status: AC | PRN
  Administered 2013-12-19: 80 mL via INTRAVENOUS

## 2013-12-22 ENCOUNTER — Ambulatory Visit (HOSPITAL_BASED_OUTPATIENT_CLINIC_OR_DEPARTMENT_OTHER): Payer: Medicare HMO | Admitting: Internal Medicine

## 2013-12-22 ENCOUNTER — Encounter: Payer: Self-pay | Admitting: Internal Medicine

## 2013-12-22 ENCOUNTER — Telehealth: Payer: Self-pay | Admitting: Cardiothoracic Surgery

## 2013-12-22 ENCOUNTER — Telehealth: Payer: Self-pay | Admitting: Internal Medicine

## 2013-12-22 VITALS — BP 143/80 | HR 106 | Temp 97.4°F | Resp 18 | Ht 69.0 in | Wt 208.4 lb

## 2013-12-22 DIAGNOSIS — C3410 Malignant neoplasm of upper lobe, unspecified bronchus or lung: Secondary | ICD-10-CM

## 2013-12-22 NOTE — Telephone Encounter (Signed)
gv adn printed appt sched and avs for pt for April  °

## 2013-12-22 NOTE — Telephone Encounter (Signed)
Reviewed the patient's recent CT scan at his request and discussed it with him on the phone. He seen Dr. [name] this morning who had recommended 9 more weeks of chemotherapy. Considering his response on the current CT scan, his stage IIIa lung cancer in a central location I explained to him that surgery would not be a notch in at this time and recommended that he follow the advice outlined by Dr. Inda Merlin.

## 2013-12-22 NOTE — Patient Instructions (Signed)
Follow up visit in 3 months with repeat CT scan of the chest unless you decide to proceed with consolidation chemotherapy.

## 2013-12-22 NOTE — Progress Notes (Signed)
[facility] [phone]   [phone]  OFFICE PROGRESS NOTE  [name], MD [address]  [place] [address]  DIAGNOSIS: Stage IIIA (T2a, N2, M0) non-small cell lung cancer, squamous cell carcinoma presented with large right upper lobe mass as well as mediastinal lymphadenopathy diagnosed in October of 2014  PRIOR THERAPY: Concurrent chemoradiation with weekly carboplatin for AUC of 2 and paclitaxel 45 mg/M2 status post 7 cycles. Last dose was given 11/03/2013 with partial response.  CURRENT THERAPY: None.  CHEMOTHERAPY INTENT: Control/curative  CURRENT # OF CHEMOTHERAPY CYCLES:  CURRENT ANTIEMETICS: Zofran, dexamethasone and Compazine  CURRENT SMOKING STATUS: Former smoker  ORAL CHEMOTHERAPY AND CONSENT: None  CURRENT BISPHOSPHONATES USE: None  PAIN MANAGEMENT: 0/10  NARCOTICS INDUCED CONSTIPATION: None  LIVING WILL AND CODE STATUS: Full code   INTERVAL HISTORY: David [name] 73 y.o. male returns to the clinic today for followup visit accompanied by his wife and daughter Claiborne Billings. The patient tolerated his previous course of concurrent chemoradiation fairly well with no significant adverse effects. He denied having any significant fever or chills. He denied having any nausea or vomiting. The patient denied having any significant chest pain, shortness of breath, cough or hemoptysis. He has mild sore throat. He has no significant weight loss or night sweats. He had repeat CT scan of the chest performed recently and he is here for evaluation and discussion of his scan results.  MEDICAL HISTORY: Past Medical History  Diagnosis Date  . Coronary artery disease     s/p PCI 2000 and single vessel CABG 2001  . Hypercholesterolemia   . GERD (gastroesophageal reflux disease)   . Tubular adenoma 2003  . COPD (chronic obstructive pulmonary disease)   . Diabetes mellitus   . Arthritis   . Allergic rhinitis     . Cancer     lung mass  . Hypertension     Dr Burt Knack  . History of radiation therapy 09/23/2013-11/12/2013    63 gray to right central chest    ALLERGIES:  has No Known Allergies.  MEDICATIONS:  Current Outpatient Prescriptions  Medication Sig Dispense Refill  . carboxymethylcellulose (REFRESH PLUS) 0.5 % SOLN Place 1 drop into both eyes 3 (three) times daily as needed (dry eyes).      Marland Kitchen dextromethorphan (DELSYM) 30 MG/5ML liquid Take 60 mg by mouth as needed for cough.      . emollient (BIAFINE) cream Apply topically 2 (two) times daily.      Marland Kitchen EXFORGE 5-160 MG per tablet TAKE ONE TABLET BY MOUTH ONCE DAILY.  30 tablet  9  . hydroxychloroquine (PLAQUENIL) 200 MG tablet Take 200 mg by mouth 2 (two) times daily.        . metFORMIN (GLUCOPHAGE) 500 MG tablet Take 500 mg by mouth 2 (two) times daily with a meal.        . metoprolol succinate (TOPROL-XL) 25 MG 24 hr tablet TAKE 1 TABLET BY MOUTH ONCE A DAY.  30 tablet  5  . omeprazole (PRILOSEC) 20 MG capsule Take 20 mg by mouth every morning.       . pravastatin (PRAVACHOL) 20 MG tablet TAKE ONE TABLET DAILY.  30 tablet  3  . sitaGLIPtin (JANUVIA) 100 MG tablet Take 100 mg by mouth every morning.       . hyaluronate sodium (RADIAPLEXRX) GEL Apply 1 application topically 2 (two) times daily.      . prochlorperazine (COMPAZINE) 10  MG tablet Take 1 tablet (10 mg total) by mouth every 6 (six) hours as needed.  30 tablet  1  . sucralfate (CARAFATE) 1 GM/10ML suspension Take 10 mLs (1 g total) by mouth 4 (four) times daily.  420 mL  1   No current facility-administered medications for this visit.    SURGICAL HISTORY:  Past Surgical History  Procedure Laterality Date  . Left rotator cuff repair    . Left knee arthroscopy   x2    for meniscal tear  . Coronary stent placement  2001  . Right knee arthroscopy    . Bilateral carpal tunnel release    . Right shoulder arthroscopy    . Coronary artery bypass graft      occlusion of the stent  and underwent one-vessel CABG in 2002  . Coronary angioplasty    . Colonoscopy  09/13/2011    Procedure: COLONOSCOPY;  Surgeon: Rogene Houston, MD;  Location: [facility];  Service: Endoscopy;  Laterality: N/A;  . Cataract extraction w/phaco  09/25/2011    Procedure: CATARACT EXTRACTION PHACO AND INTRAOCULAR LENS PLACEMENT (IOC);  Surgeon: Tonny Branch;  Location: [facility] ORS;  Service: Ophthalmology;  Laterality: Left;  CDE:10.79  . Cataract extraction w/phaco  10/05/2011    Procedure: CATARACT EXTRACTION PHACO AND INTRAOCULAR LENS PLACEMENT (IOC);  Surgeon: Tonny Branch;  Location: [facility] ORS;  Service: Ophthalmology;  Laterality: Right;  CDE: 8.45  . Video bronchoscopy with endobronchial ultrasound N/A 09/08/2013    Procedure: VIDEO BRONCHOSCOPY WITH ENDOBRONCHIAL ULTRASOUND;  Surgeon: Grace Isaac, MD;  Location: [facility] OR;  Service: Thoracic;  Laterality: N/A;    REVIEW OF SYSTEMS:  Constitutional: negative Eyes: negative Ears, nose, mouth, throat, and face: negative Respiratory: negative Cardiovascular: negative Gastrointestinal: negative Genitourinary:negative Integument/breast: negative Hematologic/lymphatic: negative Musculoskeletal:negative Neurological: negative Behavioral/Psych: negative Endocrine: negative Allergic/Immunologic: negative   PHYSICAL EXAMINATION: General appearance: alert, cooperative, fatigued and no distress Head: Normocephalic, without obvious abnormality, atraumatic Neck: no adenopathy, no JVD, supple, symmetrical, trachea midline and thyroid not enlarged, symmetric, no tenderness/mass/nodules Lymph nodes: Cervical, supraclavicular, and axillary nodes normal. Resp: clear to auscultation bilaterally Back: symmetric, no curvature. ROM normal. No CVA tenderness. Cardio: regular rate and rhythm, S1, S2 normal, no murmur, click, rub or gallop GI: soft, non-tender; bowel sounds normal; no masses,  no organomegaly Extremities: extremities normal, atraumatic, no  cyanosis or edema Neurologic: Alert and oriented X 3, normal strength and tone. Normal symmetric reflexes. Normal coordination and gait  ECOG PERFORMANCE STATUS: 1 - Symptomatic but completely ambulatory  There were no vitals taken for this visit.  LABORATORY DATA: Lab Results  Component Value Date   WBC 8.2 12/19/2013   HGB 13.9 12/19/2013   HCT 41.3 12/19/2013   MCV 96.4 12/19/2013   PLT 258 12/19/2013      Chemistry      Component Value Date/Time   NA 138 12/19/2013 0923   NA 135 09/04/2013 1419   K 4.5 12/19/2013 0923   K 4.3 09/04/2013 1419   CL 98 09/04/2013 1419   CO2 26 12/19/2013 0923   CO2 28 09/04/2013 1419   BUN 11.6 12/19/2013 0923   BUN 10 09/04/2013 1419   CREATININE 0.8 12/19/2013 0923   CREATININE 0.82 09/04/2013 1419      Component Value Date/Time   CALCIUM 9.3 12/19/2013 0923   CALCIUM 9.4 09/04/2013 1419   ALKPHOS 83 12/19/2013 0923   ALKPHOS 78 09/04/2013 1419   AST 13 12/19/2013 0923   AST 13 09/04/2013 1419  ALT 6 12/19/2013 0923   ALT 10 09/04/2013 1419   BILITOT 0.74 12/19/2013 0923   BILITOT 0.7 09/04/2013 1419       RADIOGRAPHIC STUDIES: Ct Chest W Contrast  12/19/2013   CLINICAL DATA:  Lung cancer, chemotherapy complete.  Cough.  EXAM: CT CHEST WITH CONTRAST  TECHNIQUE: Multidetector CT imaging of the chest was performed during intravenous contrast administration.  CONTRAST:  53mL OMNIPAQUE IOHEXOL 300 MG/ML  SOLN  COMPARISON:  NM PET IMAGE INITIAL (PI) SKULL BASE TO THIGH dated 09/02/2013; CT CHEST W/CM dated 08/19/2013  FINDINGS: Mediastinal lymph nodes measure up to 8 mm in short axis in the lower right paratracheal station (previously 10 mm). Right hilar mass measures 2.0 x 3.1 cm (previously 3.4 x 4.8 cm). Soft tissue thickening extends along the right upper lobe bronchi. No left hilar or axillary adenopathy. Atherosclerotic calcification of the arterial vasculature. Heart size within normal limits. No pericardial effusion. There may be a tiny hiatal hernia.   Combination of mild centrilobular and paraseptal emphysema. A peripheral and basilar predominant pattern of subpleural reticulation, traction bronchiolectasis and mild architectural distortion appears progressive from 08/19/2013. A focal area of ground-glass is seen in the posterior segment right upper lobe with some involvement of the superior segment right lower lobe. No pleural fluid. There is narrowing of the posterior segmental bronchus to the right upper lobe. Airway is otherwise unremarkable.  Incidental imaging of the upper abdomen shows no acute findings. No worrisome lytic or sclerotic lesions. Degenerative changes are seen in the spine.  IMPRESSION: 1. Interval decrease in size of a right hilar mass and mediastinal lymph nodes with evolving changes of radiation therapy in the right upper and right lower lobes. 2. Pulmonary parenchymal pattern of fibrosis appears progressive from 08/19/2013. 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   Electronically Signed   By: Lorin Picket M.D.   On: 12/19/2013 11:37   ASSESSMENT AND PLAN: This is a very pleasant 73 years old white male recently diagnosed with a stage IIIA non-small cell lung cancer. He completed a course of concurrent chemoradiation with weekly carboplatin and paclitaxel. The patient is tolerating his treatment fairly well with no significant adverse effects. His recent scan showed interval decrease in the size of the right hilar mass and mediastinal lymphadenopathy. I discussed the scan results and showed the images to the patient and his family. I gave the patient the option of continuous observation and close monitoring versus treatment with consolidation chemotherapy with carboplatin for AUC of 5 and paclitaxel 175 mg/M2 with Neulasta support every 3 weeks. I discussed with the patient adverse effect of the chemotherapy including but not limited to alopecia, myelosuppression, nausea and vomiting,  peripheral neuropathy, liver or renal dysfunction. The patient and his family would like to think about these options. I will arrange for him to come back for follow up visit in 3 months with repeat CT scan of the chest unless he decided to consider consolidation chemotherapy. He was advised to call immediately if he has any concerning symptoms in the interval. The patient voices understanding of current disease status and treatment options and is in agreement with the current care plan.  All questions were answered. The patient knows to call the clinic with any problems, questions or concerns. We can certainly see the patient much sooner if necessary.  I spent 15 minutes counseling the patient face to face. The total time spent in the appointment was 25 minutes.  Disclaimer: This  note was dictated with voice recognition software. Similar sounding words can inadvertently be transcribed and may not be corrected upon review.

## 2013-12-23 ENCOUNTER — Other Ambulatory Visit: Payer: Self-pay | Admitting: Internal Medicine

## 2013-12-23 ENCOUNTER — Telehealth: Payer: Self-pay | Admitting: *Deleted

## 2013-12-23 DIAGNOSIS — C3490 Malignant neoplasm of unspecified part of unspecified bronchus or lung: Secondary | ICD-10-CM

## 2013-12-23 DIAGNOSIS — C3410 Malignant neoplasm of upper lobe, unspecified bronchus or lung: Secondary | ICD-10-CM

## 2013-12-23 MED ORDER — METHYLPREDNISOLONE (PAK) 4 MG PO TABS: ORAL_TABLET | ORAL | Status: DC

## 2013-12-23 NOTE — Telephone Encounter (Signed)
Pt's wife called stating that pt would like to proceed with tx.  He wants to go to his rheumatologist, eye doctor, and dentist.  Per dr Vista Mink, okay to start tx in 7-10 days so he has time to get in all of his appts.  Dr Vista Mink will fill out onc tx schedule.  Pt will also get rx for medrol dose pack per Dr Vista Mink.  SLJ

## 2013-12-24 ENCOUNTER — Telehealth: Payer: Self-pay | Admitting: Internal Medicine

## 2013-12-24 NOTE — Telephone Encounter (Signed)
lvm for pt regarding to Jan adn Feb appts also advising that I lvm for [facility] to sched lab at their facility....sed added tx....mailed pt appt sched and avs with letter.Marland KitchenMarland Kitchen

## 2013-12-26 ENCOUNTER — Telehealth: Payer: Self-pay | Admitting: Internal Medicine

## 2013-12-26 NOTE — Telephone Encounter (Signed)
pt called adn advised that he would like to have inj at [facility]...lvm for [facility] sched.Marland KitchenMarland Kitchen

## 2013-12-26 NOTE — Telephone Encounter (Signed)
lvm for pt regarding to all [place] appts...pt already aware of [facility] appts

## 2014-01-01 ENCOUNTER — Ambulatory Visit (HOSPITAL_BASED_OUTPATIENT_CLINIC_OR_DEPARTMENT_OTHER): Payer: Medicare HMO

## 2014-01-01 ENCOUNTER — Other Ambulatory Visit: Payer: Medicare HMO

## 2014-01-01 VITALS — BP 138/83 | HR 105 | Temp 96.3°F | Resp 20

## 2014-01-01 DIAGNOSIS — C3410 Malignant neoplasm of upper lobe, unspecified bronchus or lung: Secondary | ICD-10-CM

## 2014-01-01 DIAGNOSIS — Z5111 Encounter for antineoplastic chemotherapy: Secondary | ICD-10-CM

## 2014-01-01 DIAGNOSIS — C3490 Malignant neoplasm of unspecified part of unspecified bronchus or lung: Secondary | ICD-10-CM

## 2014-01-01 LAB — CBC WITH DIFFERENTIAL/PLATELET
BASO%: 0.1 % (ref 0.0–2.0)
Basophils Absolute: 0 10*3/uL (ref 0.0–0.1)
EOS%: 0.3 % (ref 0.0–7.0)
Eosinophils Absolute: 0 10*3/uL (ref 0.0–0.5)
HCT: 43.6 % (ref 38.4–49.9)
HGB: 14.7 g/dL (ref 13.0–17.1)
LYMPH%: 8.8 % — ABNORMAL LOW (ref 14.0–49.0)
MCH: 31.6 pg (ref 27.2–33.4)
MCHC: 33.7 g/dL (ref 32.0–36.0)
MCV: 93.8 fL (ref 79.3–98.0)
MONO#: 1 10*3/uL — ABNORMAL HIGH (ref 0.1–0.9)
MONO%: 8.5 % (ref 0.0–14.0)
NEUT%: 82.3 % — ABNORMAL HIGH (ref 39.0–75.0)
NEUTROS ABS: 9.2 10*3/uL — AB (ref 1.5–6.5)
Platelets: 321 10*3/uL (ref 140–400)
RBC: 4.65 10*6/uL (ref 4.20–5.82)
RDW: 13.8 % (ref 11.0–14.6)
WBC: 11.1 10*3/uL — ABNORMAL HIGH (ref 4.0–10.3)
lymph#: 1 10*3/uL (ref 0.9–3.3)

## 2014-01-01 LAB — COMPREHENSIVE METABOLIC PANEL (CC13)
ALBUMIN: 3.3 g/dL — AB (ref 3.5–5.0)
ALK PHOS: 88 U/L (ref 40–150)
ALT: 9 U/L (ref 0–55)
AST: 12 U/L (ref 5–34)
Anion Gap: 10 mEq/L (ref 3–11)
BUN: 21.9 mg/dL (ref 7.0–26.0)
CO2: 25 mEq/L (ref 22–29)
Calcium: 10.2 mg/dL (ref 8.4–10.4)
Chloride: 102 mEq/L (ref 98–109)
Creatinine: 1.1 mg/dL (ref 0.7–1.3)
Glucose: 165 mg/dl — ABNORMAL HIGH (ref 70–140)
Potassium: 4.5 mEq/L (ref 3.5–5.1)
Sodium: 137 mEq/L (ref 136–145)
TOTAL PROTEIN: 7.8 g/dL (ref 6.4–8.3)
Total Bilirubin: 0.69 mg/dL (ref 0.20–1.20)

## 2014-01-01 MED ORDER — DIPHENHYDRAMINE HCL 50 MG/ML IJ SOLN
INTRAMUSCULAR | Status: AC
  Filled 2014-01-01: qty 1

## 2014-01-01 MED ORDER — FAMOTIDINE IN NACL 20-0.9 MG/50ML-% IV SOLN
20.0000 mg | Freq: Once | INTRAVENOUS | Status: AC
  Administered 2014-01-01: 20 mg via INTRAVENOUS

## 2014-01-01 MED ORDER — DEXAMETHASONE SODIUM PHOSPHATE 20 MG/5ML IJ SOLN
20.0000 mg | Freq: Once | INTRAMUSCULAR | Status: AC
  Administered 2014-01-01: 20 mg via INTRAVENOUS

## 2014-01-01 MED ORDER — ONDANSETRON 16 MG/50ML IVPB (CHCC)
16.0000 mg | Freq: Once | INTRAVENOUS | Status: AC
  Administered 2014-01-01: 16 mg via INTRAVENOUS

## 2014-01-01 MED ORDER — ONDANSETRON 16 MG/50ML IVPB (CHCC)
INTRAVENOUS | Status: AC
  Filled 2014-01-01: qty 16

## 2014-01-01 MED ORDER — SODIUM CHLORIDE 0.9 % IV SOLN
Freq: Once | INTRAVENOUS | Status: AC
  Administered 2014-01-01: 11:00:00 via INTRAVENOUS

## 2014-01-01 MED ORDER — FAMOTIDINE IN NACL 20-0.9 MG/50ML-% IV SOLN
INTRAVENOUS | Status: AC
  Filled 2014-01-01: qty 50

## 2014-01-01 MED ORDER — DIPHENHYDRAMINE HCL 50 MG/ML IJ SOLN
50.0000 mg | Freq: Once | INTRAMUSCULAR | Status: AC
  Administered 2014-01-01: 50 mg via INTRAVENOUS

## 2014-01-01 MED ORDER — SODIUM CHLORIDE 0.9 % IV SOLN
571.5000 mg | Freq: Once | INTRAVENOUS | Status: AC
  Administered 2014-01-01: 570 mg via INTRAVENOUS
  Filled 2014-01-01: qty 57

## 2014-01-01 MED ORDER — DEXAMETHASONE SODIUM PHOSPHATE 20 MG/5ML IJ SOLN
INTRAMUSCULAR | Status: AC
  Filled 2014-01-01: qty 5

## 2014-01-01 MED ORDER — PACLITAXEL CHEMO INJECTION 300 MG/50ML
372.0000 mg | Freq: Once | INTRAVENOUS | Status: AC
  Administered 2014-01-01: 372 mg via INTRAVENOUS
  Filled 2014-01-01: qty 62

## 2014-01-01 MED ORDER — PACLITAXEL CHEMO INJECTION 300 MG/50ML: 175.0000 mg/m2 | Freq: Once | INTRAVENOUS | Status: DC

## 2014-01-01 NOTE — Patient Instructions (Signed)
[facility] Discharge Instructions for Patients Receiving Chemotherapy  Today you received the following chemotherapy agents: Taxol/Carboplatin  To help prevent nausea and vomiting after your treatment, we encourage you to take your nausea medication as prescribed by your physician.   If you develop nausea and vomiting that is not controlled by your nausea medication, call the clinic.   BELOW ARE SYMPTOMS THAT SHOULD BE REPORTED IMMEDIATELY:  *FEVER GREATER THAN 100.5 F  *CHILLS WITH OR WITHOUT FEVER  NAUSEA AND VOMITING THAT IS NOT CONTROLLED WITH YOUR NAUSEA MEDICATION  *UNUSUAL SHORTNESS OF BREATH  *UNUSUAL BRUISING OR BLEEDING  TENDERNESS IN MOUTH AND THROAT WITH OR WITHOUT PRESENCE OF ULCERS  *URINARY PROBLEMS  *BOWEL PROBLEMS  UNUSUAL RASH Items with * indicate a potential emergency and should be followed up as soon as possible.  Feel free to call the clinic you have any questions or concerns. The clinic phone number is [phone].

## 2014-01-02 ENCOUNTER — Encounter (HOSPITAL_COMMUNITY): Payer: Medicare HMO | Attending: Hematology and Oncology

## 2014-01-02 ENCOUNTER — Ambulatory Visit: Payer: Medicare HMO

## 2014-01-02 VITALS — BP 114/69 | HR 115 | Temp 97.7°F | Resp 20

## 2014-01-02 DIAGNOSIS — Z5189 Encounter for other specified aftercare: Secondary | ICD-10-CM

## 2014-01-02 DIAGNOSIS — C3410 Malignant neoplasm of upper lobe, unspecified bronchus or lung: Secondary | ICD-10-CM

## 2014-01-02 MED ORDER — PEGFILGRASTIM INJECTION 6 MG/0.6ML
6.0000 mg | Freq: Once | SUBCUTANEOUS | Status: AC
  Administered 2014-01-02: 6 mg via SUBCUTANEOUS
  Filled 2014-01-02: qty 0.6

## 2014-01-02 NOTE — Progress Notes (Signed)
David [name] presents today for injection per MD orders. Neulasta 6mg  administered SQ in right Abdomen. Administration without incident. Patient tolerated well.  Patient given instructions regarding Neulasta as well as a reference sheet upon discharge from [facility].

## 2014-01-02 NOTE — Patient Instructions (Signed)
[facility] Discharge Instructions  RECOMMENDATIONS MADE BY THE CONSULTANT AND ANY TEST RESULTS WILL BE SENT TO YOUR REFERRING PHYSICIAN.  EXAM FINDINGS BY THE PHYSICIAN TODAY AND SIGNS OR SYMPTOMS TO REPORT TO CLINIC OR PRIMARY PHYSICIAN:   You received Neulasta today. Don't be surprised if you develop bone or muscle pain. The drug of choice to relieve/alleviate pain is Aleve (naprosyn) or Ibuprofen.      Thank you for choosing [facility] to provide your oncology and hematology care.  To afford each patient quality time with our providers, please arrive at least 15 minutes before your scheduled appointment time.  With your help, our goal is to use those 15 minutes to complete the necessary work-up to ensure our physicians have the information they need to help with your evaluation and healthcare recommendations.    Effective January 1st, 2014, we ask that you re-schedule your appointment with our physicians should you arrive 10 or more minutes late for your appointment.  We strive to give you quality time with our providers, and arriving late affects you and other patients whose appointments are after yours.    Again, thank you for choosing [facility].  Our hope is that these requests will decrease the amount of time that you wait before being seen by our physicians.       _____________________________________________________________  Should you have questions after your visit to [facility], please contact our office at [phone] between the hours of 8:30 a.m. and 5:00 p.m.  Voicemails left after 4:30 p.m. will not be returned until the following business day.  For prescription refill requests, have your pharmacy contact our office with your prescription refill request.    Pegfilgrastim injection What is this medicine? PEGFILGRASTIM (peg fil GRA stim) helps the body make more white blood cells. It is used to prevent  infection in people with low amounts of white blood cells following cancer treatment. This medicine may be used for other purposes; ask your health care provider or pharmacist if you have questions. COMMON BRAND NAME(S): Neulasta What should I tell my health care provider before I take this medicine? They need to know if you have any of these conditions: -sickle cell disease -an unusual or allergic reaction to pegfilgrastim, filgrastim, E.coli protein, other medicines, foods, dyes, or preservatives -pregnant or trying to get pregnant -breast-feeding How should I use this medicine? This medicine is for injection under the skin. It is usually given by a health care professional in a hospital or clinic setting. If you get this medicine at home, you will be taught how to prepare and give this medicine. Do not shake this medicine. Use exactly as directed. Take your medicine at regular intervals. Do not take your medicine more often than directed. It is important that you put your used needles and syringes in a special sharps container. Do not put them in a trash can. If you do not have a sharps container, call your pharmacist or healthcare provider to get one. Talk to your pediatrician regarding the use of this medicine in children. While this drug may be prescribed for children who weigh more than 45 kg for selected conditions, precautions do apply Overdosage: If you think you have taken too much of this medicine contact a poison control center or emergency room at once. NOTE: This medicine is only for you. Do not share this medicine with others. What if I miss a dose? If you  miss a dose, take it as soon as you can. If it is almost time for your next dose, take only that dose. Do not take double or extra doses. What may interact with this medicine? -lithium -medicines for growth therapy This list may not describe all possible interactions. Give your health care provider a list of all the medicines,  herbs, non-prescription drugs, or dietary supplements you use. Also tell them if you smoke, drink alcohol, or use illegal drugs. Some items may interact with your medicine. What should I watch for while using this medicine? Visit your doctor for regular check ups. You will need important blood work done while you are taking this medicine. What side effects may I notice from receiving this medicine? Side effects that you should report to your doctor or health care professional as soon as possible: -allergic reactions like skin rash, itching or hives, swelling of the face, lips, or tongue -breathing problems -fever -pain, redness, or swelling where injected -shoulder pain -stomach or side pain Side effects that usually do not require medical attention (report to your doctor or health care professional if they continue or are bothersome): -aches, pains -headache -loss of appetite -nausea, vomiting -unusually tired This list may not describe all possible side effects. Call your doctor for medical advice about side effects. You may report side effects to FDA at 1-800-FDA-1088. Where should I keep my medicine? Keep out of the reach of children. Store in a refrigerator between 2 and 8 degrees C (36 and 46 degrees F). Do not freeze. Keep in carton to protect from light. Throw away this medicine if it is left out of the refrigerator for more than 48 hours. Throw away any unused medicine after the expiration date. NOTE: This sheet is a summary. It may not cover all possible information. If you have questions about this medicine, talk to your doctor, pharmacist, or health care provider.  2014, Elsevier/Gold Standard. (2008-06-22 15:41:44)

## 2014-01-08 ENCOUNTER — Ambulatory Visit (HOSPITAL_BASED_OUTPATIENT_CLINIC_OR_DEPARTMENT_OTHER): Payer: Medicare HMO | Admitting: Physician Assistant

## 2014-01-08 ENCOUNTER — Encounter: Payer: Self-pay | Admitting: Physician Assistant

## 2014-01-08 ENCOUNTER — Telehealth: Payer: Self-pay | Admitting: Internal Medicine

## 2014-01-08 ENCOUNTER — Other Ambulatory Visit (HOSPITAL_BASED_OUTPATIENT_CLINIC_OR_DEPARTMENT_OTHER): Payer: Medicare HMO

## 2014-01-08 ENCOUNTER — Telehealth: Payer: Self-pay | Admitting: *Deleted

## 2014-01-08 VITALS — BP 133/68 | HR 114 | Temp 96.8°F | Resp 17 | Ht 69.0 in | Wt 201.0 lb

## 2014-01-08 DIAGNOSIS — C3410 Malignant neoplasm of upper lobe, unspecified bronchus or lung: Secondary | ICD-10-CM

## 2014-01-08 DIAGNOSIS — R5383 Other fatigue: Secondary | ICD-10-CM

## 2014-01-08 DIAGNOSIS — R5381 Other malaise: Secondary | ICD-10-CM

## 2014-01-08 LAB — CBC WITH DIFFERENTIAL/PLATELET
BASO%: 0.4 % (ref 0.0–2.0)
Basophils Absolute: 0 10*3/uL (ref 0.0–0.1)
EOS ABS: 0 10*3/uL (ref 0.0–0.5)
EOS%: 0.4 % (ref 0.0–7.0)
HCT: 38.8 % (ref 38.4–49.9)
HGB: 13.1 g/dL (ref 13.0–17.1)
LYMPH%: 4.3 % — ABNORMAL LOW (ref 14.0–49.0)
MCH: 32.3 pg (ref 27.2–33.4)
MCHC: 33.7 g/dL (ref 32.0–36.0)
MCV: 95.7 fL (ref 79.3–98.0)
MONO#: 0.8 10*3/uL (ref 0.1–0.9)
MONO%: 10.8 % (ref 0.0–14.0)
NEUT#: 6.1 10*3/uL (ref 1.5–6.5)
NEUT%: 84.1 % — ABNORMAL HIGH (ref 39.0–75.0)
Platelets: 207 10*3/uL (ref 140–400)
RBC: 4.06 10*6/uL — AB (ref 4.20–5.82)
RDW: 14.1 % (ref 11.0–14.6)
WBC: 7.3 10*3/uL (ref 4.0–10.3)
lymph#: 0.3 10*3/uL — ABNORMAL LOW (ref 0.9–3.3)

## 2014-01-08 LAB — COMPREHENSIVE METABOLIC PANEL (CC13)
ALK PHOS: 115 U/L (ref 40–150)
ALT: 14 U/L (ref 0–55)
AST: 13 U/L (ref 5–34)
Albumin: 3.1 g/dL — ABNORMAL LOW (ref 3.5–5.0)
Anion Gap: 8 mEq/L (ref 3–11)
BILIRUBIN TOTAL: 0.52 mg/dL (ref 0.20–1.20)
BUN: 18.6 mg/dL (ref 7.0–26.0)
CO2: 25 mEq/L (ref 22–29)
CREATININE: 1.1 mg/dL (ref 0.7–1.3)
Calcium: 10.1 mg/dL (ref 8.4–10.4)
Chloride: 102 mEq/L (ref 98–109)
GLUCOSE: 224 mg/dL — AB (ref 70–140)
Potassium: 4.5 mEq/L (ref 3.5–5.1)
SODIUM: 135 meq/L — AB (ref 136–145)
TOTAL PROTEIN: 7.1 g/dL (ref 6.4–8.3)

## 2014-01-08 MED ORDER — PROCHLORPERAZINE MALEATE 10 MG PO TABS: 10.0000 mg | ORAL_TABLET | Freq: Four times a day (QID) | ORAL | Status: DC | PRN

## 2014-01-08 MED ORDER — HYDROCODONE-ACETAMINOPHEN 5-325 MG PO TABS: 1.0000 | ORAL_TABLET | Freq: Four times a day (QID) | ORAL | Status: AC | PRN

## 2014-01-08 NOTE — Telephone Encounter (Signed)
Per staff phone call and POF I have schedueld appts.  JMW  

## 2014-01-08 NOTE — Patient Instructions (Signed)
Continue weekly labs as scheduled Followup in 2 weeks prior to the start of your next scheduled cycle of consolidation chemotherapy

## 2014-01-08 NOTE — Telephone Encounter (Signed)
gave pt appt for lab,md,injections  and chemo for February and March 2015

## 2014-01-08 NOTE — Progress Notes (Addendum)
[facility] [phone]   [phone]  SHARED VISIT PROGRESS NOTE  [name], MD [address] [place] [address]  DIAGNOSIS: Stage IIIA (T2a, N2, M0) non-small cell lung cancer, squamous cell carcinoma presented with large right upper lobe mass as well as mediastinal lymphadenopathy diagnosed in October of 2014  PRIOR THERAPY: Concurrent chemoradiation with weekly carboplatin for AUC of 2 and paclitaxel 45 mg/M2 status post 7 cycles. Last dose was given 11/03/2013 with partial response.  CURRENT THERAPY: Consolidation chemotherapy with carboplatin for an AUC of 5 and paclitaxel 175 mg per meter squared with Neulasta support given every 3 weeks. Status post 1 cycle  CHEMOTHERAPY INTENT: Control/curative  CURRENT # OF CHEMOTHERAPY CYCLES: 1  CURRENT ANTIEMETICS: Zofran, dexamethasone and Compazine  CURRENT SMOKING STATUS: Former smoker  ORAL CHEMOTHERAPY AND CONSENT: None  CURRENT BISPHOSPHONATES USE: None  PAIN MANAGEMENT: 0/10  NARCOTICS INDUCED CONSTIPATION: None  LIVING WILL AND CODE STATUS: Full code   INTERVAL HISTORY: GYASI HAZZARD 73 y.o. male returns to the clinic today for a symptom management visit after receiving his first cycle of consolidation chemotherapy with carboplatin and paclitaxel with Neulasta support. He reports significant bone and joint pain after the Neulasta injection. Aleve did not offer any relief. He had a very old prescription for some codeine that he try taking and ultimately also took some Claritin with relief. He requests a refill for his Compazine as well as something stronger for pain associated with the Neulasta injections unrelieved by the Claritin. He reports a mild cough and hoarseness, not associated with fever or chills. He also occasionally has some trouble swallowing but has had no choking episodes. He noted increase in his blood sugar readings after taking the  steroids. He denied having any significant fever or chills. He denied having any nausea or vomiting. The patient denied having any significant chest pain, shortness of breath, cough or hemoptysis.  Hedenied significant weight loss or night sweats.   MEDICAL HISTORY: Past Medical History  Diagnosis Date  . Coronary artery disease     s/p PCI 2000 and single vessel CABG 2001  . Hypercholesterolemia   . GERD (gastroesophageal reflux disease)   . Tubular adenoma 2003  . COPD (chronic obstructive pulmonary disease)   . Diabetes mellitus   . Arthritis   . Allergic rhinitis   . Cancer     lung mass  . Hypertension     Dr Burt Knack  . History of radiation therapy 09/23/2013-11/12/2013    63 gray to right central chest    ALLERGIES:  has No Known Allergies.  MEDICATIONS:  Current Outpatient Prescriptions  Medication Sig Dispense Refill  . carboxymethylcellulose (REFRESH PLUS) 0.5 % SOLN Place 1 drop into both eyes 3 (three) times daily as needed (dry eyes).      Marland Kitchen dextromethorphan (DELSYM) 30 MG/5ML liquid Take 60 mg by mouth as needed for cough.      . EXFORGE 5-160 MG per tablet TAKE ONE TABLET BY MOUTH ONCE DAILY.  30 tablet  9  . hydroxychloroquine (PLAQUENIL) 200 MG tablet Take 200 mg by mouth 2 (two) times daily.        . metFORMIN (GLUCOPHAGE) 500 MG tablet Take 500 mg by mouth 2 (two) times daily with a meal.        . methylPREDNIsolone (MEDROL DOSPACK) 4 MG tablet follow package directions  21 tablet  0  . metoprolol succinate (TOPROL-XL) 25  MG 24 hr tablet TAKE 1 TABLET BY MOUTH ONCE A DAY.  30 tablet  5  . omeprazole (PRILOSEC) 20 MG capsule Take 20 mg by mouth every morning.       . pravastatin (PRAVACHOL) 20 MG tablet TAKE ONE TABLET DAILY.  30 tablet  3  . prochlorperazine (COMPAZINE) 10 MG tablet Take 1 tablet (10 mg total) by mouth every 6 (six) hours as needed.  30 tablet  1  . sitaGLIPtin (JANUVIA) 100 MG tablet Take 100 mg by mouth every morning.       Marland Kitchen emollient  (BIAFINE) cream Apply topically 2 (two) times daily.      . hyaluronate sodium (RADIAPLEXRX) GEL Apply 1 application topically 2 (two) times daily.      Marland Kitchen HYDROcodone-acetaminophen (NORCO/VICODIN) 5-325 MG per tablet Take 1 tablet by mouth every 6 (six) hours as needed for moderate pain.  30 tablet  0  . sucralfate (CARAFATE) 1 GM/10ML suspension Take 10 mLs (1 g total) by mouth 4 (four) times daily.  420 mL  1   No current facility-administered medications for this visit.    SURGICAL HISTORY:  Past Surgical History  Procedure Laterality Date  . Left rotator cuff repair    . Left knee arthroscopy   x2    for meniscal tear  . Coronary stent placement  2001  . Right knee arthroscopy    . Bilateral carpal tunnel release    . Right shoulder arthroscopy    . Coronary artery bypass graft      occlusion of the stent and underwent one-vessel CABG in 2002  . Coronary angioplasty    . Colonoscopy  09/13/2011    Procedure: COLONOSCOPY;  Surgeon: Rogene Houston, MD;  Location: [facility];  Service: Endoscopy;  Laterality: N/A;  . Cataract extraction w/phaco  09/25/2011    Procedure: CATARACT EXTRACTION PHACO AND INTRAOCULAR LENS PLACEMENT (IOC);  Surgeon: Tonny Branch;  Location: [facility] ORS;  Service: Ophthalmology;  Laterality: Left;  CDE:10.79  . Cataract extraction w/phaco  10/05/2011    Procedure: CATARACT EXTRACTION PHACO AND INTRAOCULAR LENS PLACEMENT (IOC);  Surgeon: Tonny Branch;  Location: [facility] OR[facility];  Service: Ophthalmology;  Laterality: Right;  CDE: 8.45  . Video bronchoscopy with endobronchial ultrasound N/A 09/08/2013    Procedure: VIDEO BRONCHOSCOPY WITH ENDOBRONCHIAL ULTRASOUND;  Surgeon: Grace Isaac, MD;  Location: [facility] OR;  Service: Thoracic;  Laterality: N/A;    REVIEW OF SYSTEMS:  Constitutional: negative Eyes: negative Ears, nose, mouth, throat, and face: negative Respiratory: negative Cardiovascular: negative Gastrointestinal:  negative Genitourinary:negative Integument/breast: negative Hematologic/lymphatic: negative Musculoskeletal:positive for arthralgias and bone pain Neurological: negative Behavioral/Psych: negative Endocrine: negative Allergic/Immunologic: negative   PHYSICAL EXAMINATION: General appearance: alert, cooperative, fatigued and no distress Head: Normocephalic, without obvious abnormality, atraumatic Neck: no adenopathy, no JVD, supple, symmetrical, trachea midline and thyroid not enlarged, symmetric, no tenderness/mass/nodules Lymph nodes: Cervical, supraclavicular, and axillary nodes normal. Resp: clear to auscultation bilaterally Back: symmetric, no curvature. ROM normal. No CVA tenderness. Cardio: regular rate and rhythm, S1, S2 normal, no murmur, click, rub or gallop GI: soft, non-tender; bowel sounds normal; no masses,  no organomegaly Extremities: extremities normal, atraumatic, no cyanosis or edema Neurologic: Alert and oriented X 3, normal strength and tone. Normal symmetric reflexes. Normal coordination and gait  ECOG PERFORMANCE STATUS: 1 - Symptomatic but completely ambulatory  Blood pressure 133/68, pulse 114, temperature 96.8 F (36 C), temperature source Oral, resp. rate 17, height 5\' 9"  (1.753 m), weight 201 lb (91.173  kg), SpO2 93.00%.  LABORATORY DATA: Lab Results  Component Value Date   WBC 7.3 01/08/2014   HGB 13.1 01/08/2014   HCT 38.8 01/08/2014   MCV 95.7 01/08/2014   PLT 207 01/08/2014      Chemistry      Component Value Date/Time   NA 135* 01/08/2014 1000   NA 135 09/04/2013 1419   K 4.5 01/08/2014 1000   K 4.3 09/04/2013 1419   CL 98 09/04/2013 1419   CO2 25 01/08/2014 1000   CO2 28 09/04/2013 1419   BUN 18.6 01/08/2014 1000   BUN 10 09/04/2013 1419   CREATININE 1.1 01/08/2014 1000   CREATININE 0.82 09/04/2013 1419      Component Value Date/Time   CALCIUM 10.1 01/08/2014 1000   CALCIUM 9.4 09/04/2013 1419   ALKPHOS 115 01/08/2014 1000   ALKPHOS 78 09/04/2013 1419   AST 13  01/08/2014 1000   AST 13 09/04/2013 1419   ALT 14 01/08/2014 1000   ALT 10 09/04/2013 1419   BILITOT 0.52 01/08/2014 1000   BILITOT 0.7 09/04/2013 1419       RADIOGRAPHIC STUDIES: Ct Chest W Contrast  12/19/2013   CLINICAL DATA:  Lung cancer, chemotherapy complete.  Cough.  EXAM: CT CHEST WITH CONTRAST  TECHNIQUE: Multidetector CT imaging of the chest was performed during intravenous contrast administration.  CONTRAST:  9mL OMNIPAQUE IOHEXOL 300 MG/ML  SOLN  COMPARISON:  NM PET IMAGE INITIAL (PI) SKULL BASE TO THIGH dated 09/02/2013; CT CHEST W/CM dated 08/19/2013  FINDINGS: Mediastinal lymph nodes measure up to 8 mm in short axis in the lower right paratracheal station (previously 10 mm). Right hilar mass measures 2.0 x 3.1 cm (previously 3.4 x 4.8 cm). Soft tissue thickening extends along the right upper lobe bronchi. No left hilar or axillary adenopathy. Atherosclerotic calcification of the arterial vasculature. Heart size within normal limits. No pericardial effusion. There may be a tiny hiatal hernia.  Combination of mild centrilobular and paraseptal emphysema. A peripheral and basilar predominant pattern of subpleural reticulation, traction bronchiolectasis and mild architectural distortion appears progressive from 08/19/2013. A focal area of ground-glass is seen in the posterior segment right upper lobe with some involvement of the superior segment right lower lobe. No pleural fluid. There is narrowing of the posterior segmental bronchus to the right upper lobe. Airway is otherwise unremarkable.  Incidental imaging of the upper abdomen shows no acute findings. No worrisome lytic or sclerotic lesions. Degenerative changes are seen in the spine.  IMPRESSION: 1. Interval decrease in size of a right hilar mass and mediastinal lymph nodes with evolving changes of radiation therapy in the right upper and right lower lobes. 2. Pulmonary parenchymal pattern of fibrosis appears progressive from 08/19/2013.  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   Electronically Signed   By: Lorin Picket M.D.   On: 12/19/2013 11:37   ASSESSMENT AND PLAN: This is a very pleasant 73 years old white male recently diagnosed with a stage IIIA non-small cell lung cancer. He completed a course of concurrent chemoradiation with weekly carboplatin and paclitaxel. The patient is tolerating his treatment fairly well with no significant adverse effects. His recent scan showed interval decrease in the size of the right hilar mass and mediastinal lymphadenopathy. Patient is currently receiving consolidation chemotherapy with carboplatin for AUC of 5 and paclitaxel 175 mg/M2 with Neulasta support every 3 weeks, status post 1 cycle. Patient was discussed with an also seen by Dr. [name]. He'll continue with  weekly labs as scheduled. A refill for his Compazine was sent to his pharmacy of record via E. scribed. He was given a prescription for Vicodin 5/325 mg tablets to be taken 1 tablet by mouth every 6 hours as needed for pain a total 30 tablets with no refill. He'll return in 2 weeks prior to the start of cycle #2 for another symptom management visit. He was advised to call immediately if he has any concerning symptoms in the interval. The patient voices understanding of current disease status and treatment options and is in agreement with the current care plan.  All questions were answered. The patient knows to call the clinic with any problems, questions or concerns. We can certainly see the patient much sooner if necessary.  Carlton Adam PA-C   ADDENDUM: Hematology/Oncology Attending: I had the face to face encounter with the patient. I recommended his care plan. The patient tolerated the first cycle of his consolidation chemotherapy fairly well except for increasing fatigue and weakness after the Neulasta injection. He took Claritin and some pain medication with improvement in his  symptoms he days later. I recommended for him to continue his treatment with consolidation chemotherapy as scheduled and the next cycle is scheduled in 2 weeks. We will start him on Vicodin 5/325 mg by mouth every 6 hours as needed for pain. He would come back for followup visit in 2 weeks with the start of the next cycle of his treatment. He was advised to call immediately if he has any concerning symptoms in the interval.  Disclaimer: This note was dictated with voice recognition software. Similar sounding words can inadvertently be transcribed and may not be corrected upon review. Eilleen Kempf., MD 01/09/2014

## 2014-01-09 ENCOUNTER — Telehealth: Payer: Self-pay | Admitting: Medical Oncology

## 2014-01-09 NOTE — Telephone Encounter (Signed)
Reports this  am he he had 2-3 episodes of coughing up bloody sputum size of quarter. He denies increase in shortness of breath or dyspnea , dizziness, weakness. I told him to go to nearest ED if symptom worsens.

## 2014-01-12 ENCOUNTER — Inpatient Hospital Stay (HOSPITAL_COMMUNITY)
Admission: EM | Admit: 2014-01-12 | Discharge: 2014-01-17 | DRG: 981 | Disposition: A | Discharge status: Home / Self Care | Payer: Medicare HMO | Attending: Internal Medicine | Admitting: Internal Medicine

## 2014-01-12 ENCOUNTER — Encounter (HOSPITAL_COMMUNITY): Payer: Self-pay | Admitting: Emergency Medicine

## 2014-01-12 ENCOUNTER — Emergency Department (HOSPITAL_COMMUNITY): Payer: Medicare HMO

## 2014-01-12 DIAGNOSIS — Z923 Personal history of irradiation: Secondary | ICD-10-CM | POA: Diagnosis not present

## 2014-01-12 DIAGNOSIS — Z8249 Family history of ischemic heart disease and other diseases of the circulatory system: Secondary | ICD-10-CM

## 2014-01-12 DIAGNOSIS — I2510 Atherosclerotic heart disease of native coronary artery without angina pectoris: Secondary | ICD-10-CM | POA: Diagnosis present

## 2014-01-12 DIAGNOSIS — Z9861 Coronary angioplasty status: Secondary | ICD-10-CM

## 2014-01-12 DIAGNOSIS — Z951 Presence of aortocoronary bypass graft: Secondary | ICD-10-CM | POA: Diagnosis not present

## 2014-01-12 DIAGNOSIS — K219 Gastro-esophageal reflux disease without esophagitis: Secondary | ICD-10-CM | POA: Diagnosis present

## 2014-01-12 DIAGNOSIS — R599 Enlarged lymph nodes, unspecified: Secondary | ICD-10-CM | POA: Diagnosis present

## 2014-01-12 DIAGNOSIS — E119 Type 2 diabetes mellitus without complications: Secondary | ICD-10-CM | POA: Diagnosis present

## 2014-01-12 DIAGNOSIS — Z9849 Cataract extraction status, unspecified eye: Secondary | ICD-10-CM

## 2014-01-12 DIAGNOSIS — I10 Essential (primary) hypertension: Secondary | ICD-10-CM | POA: Diagnosis present

## 2014-01-12 DIAGNOSIS — R042 Hemoptysis: Secondary | ICD-10-CM | POA: Diagnosis present

## 2014-01-12 DIAGNOSIS — J189 Pneumonia, unspecified organism: Secondary | ICD-10-CM | POA: Diagnosis present

## 2014-01-12 DIAGNOSIS — Z6829 Body mass index (BMI) 29.0-29.9, adult: Secondary | ICD-10-CM | POA: Diagnosis not present

## 2014-01-12 DIAGNOSIS — I498 Other specified cardiac arrhythmias: Secondary | ICD-10-CM | POA: Diagnosis present

## 2014-01-12 DIAGNOSIS — J4489 Other specified chronic obstructive pulmonary disease: Secondary | ICD-10-CM | POA: Diagnosis present

## 2014-01-12 DIAGNOSIS — J449 Chronic obstructive pulmonary disease, unspecified: Secondary | ICD-10-CM

## 2014-01-12 DIAGNOSIS — R0902 Hypoxemia: Secondary | ICD-10-CM

## 2014-01-12 DIAGNOSIS — C3410 Malignant neoplasm of upper lobe, unspecified bronchus or lung: Principal | ICD-10-CM | POA: Diagnosis present

## 2014-01-12 DIAGNOSIS — E785 Hyperlipidemia, unspecified: Secondary | ICD-10-CM | POA: Diagnosis present

## 2014-01-12 DIAGNOSIS — R197 Diarrhea, unspecified: Secondary | ICD-10-CM | POA: Diagnosis present

## 2014-01-12 DIAGNOSIS — Z87891 Personal history of nicotine dependence: Secondary | ICD-10-CM | POA: Diagnosis not present

## 2014-01-12 DIAGNOSIS — J9600 Acute respiratory failure, unspecified whether with hypoxia or hypercapnia: Secondary | ICD-10-CM | POA: Diagnosis present

## 2014-01-12 DIAGNOSIS — E669 Obesity, unspecified: Secondary | ICD-10-CM | POA: Diagnosis present

## 2014-01-12 DIAGNOSIS — Z801 Family history of malignant neoplasm of trachea, bronchus and lung: Secondary | ICD-10-CM | POA: Diagnosis not present

## 2014-01-12 LAB — POCT I-STAT TROPONIN I: Troponin i, poc: 0.01 ng/mL (ref 0.00–0.08)

## 2014-01-12 LAB — CBC
HEMATOCRIT: 38.3 % — AB (ref 39.0–52.0)
Hemoglobin: 12.9 g/dL — ABNORMAL LOW (ref 13.0–17.0)
MCH: 31.6 pg (ref 26.0–34.0)
MCHC: 33.7 g/dL (ref 30.0–36.0)
MCV: 93.9 fL (ref 78.0–100.0)
PLATELETS: 240 10*3/uL (ref 150–400)
RBC: 4.08 MIL/uL — AB (ref 4.22–5.81)
RDW: 13.1 % (ref 11.5–15.5)
WBC: 11.1 10*3/uL — ABNORMAL HIGH (ref 4.0–10.5)

## 2014-01-12 LAB — BASIC METABOLIC PANEL
BUN: 10 mg/dL (ref 6–23)
CALCIUM: 9.1 mg/dL (ref 8.4–10.5)
CO2: 24 meq/L (ref 19–32)
CREATININE: 0.89 mg/dL (ref 0.50–1.35)
Chloride: 94 mEq/L — ABNORMAL LOW (ref 96–112)
GFR calc Af Amer: >90 mL/min (ref >90)
GFR calc non Af Amer: 83 mL/min — ABNORMAL LOW (ref >90)
GLUCOSE: 223 mg/dL — AB (ref 70–99)
Potassium: 4.4 mEq/L (ref 3.7–5.3)
Sodium: 135 mEq/L — ABNORMAL LOW (ref 137–147)

## 2014-01-12 LAB — TYPE AND SCREEN
ABO/RH(D): A POS
Antibody Screen: NEGATIVE

## 2014-01-12 LAB — PRO B NATRIURETIC PEPTIDE: PRO B NATRI PEPTIDE: 204.4 pg/mL — AB (ref 0–125)

## 2014-01-12 LAB — GLUCOSE, CAPILLARY: GLUCOSE-CAPILLARY: 217 mg/dL — AB (ref 70–99)

## 2014-01-12 MED ORDER — CARBOXYMETHYLCELLULOSE SODIUM 0.5 % OP SOLN: 1.0000 [drp] | Freq: Three times a day (TID) | OPHTHALMIC | Status: DC | PRN

## 2014-01-12 MED ORDER — LEVOFLOXACIN 500 MG PO TABS: 500.0000 mg | ORAL_TABLET | Freq: Every day | ORAL | Status: AC

## 2014-01-12 MED ORDER — DEXTROMETHORPHAN POLISTIREX 30 MG/5ML PO LQCR
60.0000 mg | ORAL | Status: DC | PRN
  Administered 2014-01-13 – 2014-01-15 (×2): 60 mg via ORAL
  Filled 2014-01-12 (×2): qty 10

## 2014-01-12 MED ORDER — IOHEXOL 350 MG/ML SOLN
100.0000 mL | Freq: Once | INTRAVENOUS | Status: AC | PRN
Start: 2014-01-12 — End: 2014-01-12
  Administered 2014-01-12: 100 mL via INTRAVENOUS

## 2014-01-12 MED ORDER — METOPROLOL SUCCINATE ER 25 MG PO TB24
25.0000 mg | ORAL_TABLET | Freq: Every day | ORAL | Status: DC
  Administered 2014-01-12 – 2014-01-16 (×5): 25 mg via ORAL
  Filled 2014-01-12 (×6): qty 1

## 2014-01-12 MED ORDER — ACETAMINOPHEN 325 MG PO TABS: 650.0000 mg | ORAL_TABLET | Freq: Four times a day (QID) | ORAL | Status: DC | PRN

## 2014-01-12 MED ORDER — SIMVASTATIN 10 MG PO TABS
10.0000 mg | ORAL_TABLET | Freq: Every day | ORAL | Status: DC
  Administered 2014-01-12 – 2014-01-16 (×5): 10 mg via ORAL
  Filled 2014-01-12 (×6): qty 1

## 2014-01-12 MED ORDER — ONDANSETRON HCL 4 MG/2ML IJ SOLN
4.0000 mg | Freq: Four times a day (QID) | INTRAMUSCULAR | Status: DC | PRN
Start: 2014-01-12 — End: 2014-01-17

## 2014-01-12 MED ORDER — PANTOPRAZOLE SODIUM 40 MG PO TBEC
40.0000 mg | DELAYED_RELEASE_TABLET | Freq: Every day | ORAL | Status: DC
  Administered 2014-01-13 – 2014-01-17 (×5): 40 mg via ORAL
  Filled 2014-01-12 (×5): qty 1

## 2014-01-12 MED ORDER — ONDANSETRON HCL 4 MG PO TABS
4.0000 mg | ORAL_TABLET | Freq: Four times a day (QID) | ORAL | Status: DC | PRN
Start: 2014-01-12 — End: 2014-01-17

## 2014-01-12 MED ORDER — ALBUTEROL SULFATE (2.5 MG/3ML) 0.083% IN NEBU
2.5000 mg | INHALATION_SOLUTION | RESPIRATORY_TRACT | Status: DC | PRN
  Filled 2014-01-12: qty 3

## 2014-01-12 MED ORDER — HYDROCODONE-ACETAMINOPHEN 5-325 MG PO TABS: 1.0000 | ORAL_TABLET | Freq: Four times a day (QID) | ORAL | Status: DC | PRN

## 2014-01-12 MED ORDER — ACETAMINOPHEN 650 MG RE SUPP
650.0000 mg | Freq: Four times a day (QID) | RECTAL | Status: DC | PRN
Start: 2014-01-12 — End: 2014-01-17

## 2014-01-12 MED ORDER — INSULIN ASPART 100 UNIT/ML ~~LOC~~ SOLN
0.0000 [IU] | Freq: Three times a day (TID) | SUBCUTANEOUS | Status: DC
  Administered 2014-01-13: 3 [IU] via SUBCUTANEOUS
  Administered 2014-01-13 (×2): 5 [IU] via SUBCUTANEOUS
  Administered 2014-01-14: 2 [IU] via SUBCUTANEOUS
  Administered 2014-01-14 – 2014-01-15 (×4): 3 [IU] via SUBCUTANEOUS
  Administered 2014-01-16 (×2): 2 [IU] via SUBCUTANEOUS
  Administered 2014-01-17: 5 [IU] via SUBCUTANEOUS
  Administered 2014-01-17: 3 [IU] via SUBCUTANEOUS

## 2014-01-12 MED ORDER — VALSARTAN 160 MG PO TABS
160.0000 mg | ORAL_TABLET | Freq: Every day | ORAL | Status: DC
  Administered 2014-01-13 – 2014-01-17 (×5): 160 mg via ORAL
  Filled 2014-01-12 (×5): qty 1

## 2014-01-12 MED ORDER — SODIUM CHLORIDE 0.9 % IV BOLUS (SEPSIS)
500.0000 mL | Freq: Once | INTRAVENOUS | Status: AC
  Administered 2014-01-12: 500 mL via INTRAVENOUS

## 2014-01-12 MED ORDER — LEVOFLOXACIN IN D5W 750 MG/150ML IV SOLN
750.0000 mg | Freq: Once | INTRAVENOUS | Status: AC
  Administered 2014-01-12: 750 mg via INTRAVENOUS
  Filled 2014-01-12 (×2): qty 150

## 2014-01-12 MED ORDER — LEVOFLOXACIN IN D5W 750 MG/150ML IV SOLN
750.0000 mg | INTRAVENOUS | Status: DC
  Administered 2014-01-13 – 2014-01-16 (×4): 750 mg via INTRAVENOUS
  Filled 2014-01-12 (×5): qty 150

## 2014-01-12 MED ORDER — POLYVINYL ALCOHOL 1.4 % OP SOLN
1.0000 [drp] | Freq: Three times a day (TID) | OPHTHALMIC | Status: DC | PRN
  Filled 2014-01-12: qty 15

## 2014-01-12 MED ORDER — AMLODIPINE BESYLATE-VALSARTAN 5-160 MG PO TABS: 1.0000 | ORAL_TABLET | Freq: Every day | ORAL | Status: DC

## 2014-01-12 MED ORDER — ALBUTEROL SULFATE (2.5 MG/3ML) 0.083% IN NEBU
2.5000 mg | INHALATION_SOLUTION | Freq: Four times a day (QID) | RESPIRATORY_TRACT | Status: DC
  Administered 2014-01-12 – 2014-01-13 (×3): 2.5 mg via RESPIRATORY_TRACT
  Filled 2014-01-12 (×3): qty 3

## 2014-01-12 MED ORDER — GUAIFENESIN-CODEINE 100-10 MG/5ML PO SOLN: 5.0000 mL | ORAL | Status: DC | PRN

## 2014-01-12 MED ORDER — SODIUM CHLORIDE 0.9 % IV SOLN
INTRAVENOUS | Status: DC
  Administered 2014-01-12 – 2014-01-16 (×5): via INTRAVENOUS

## 2014-01-12 MED ORDER — LINAGLIPTIN 5 MG PO TABS
5.0000 mg | ORAL_TABLET | Freq: Every day | ORAL | Status: DC
  Administered 2014-01-13 – 2014-01-17 (×5): 5 mg via ORAL
  Filled 2014-01-12 (×5): qty 1

## 2014-01-12 MED ORDER — AMLODIPINE BESYLATE 5 MG PO TABS
5.0000 mg | ORAL_TABLET | Freq: Every day | ORAL | Status: DC
  Administered 2014-01-13 – 2014-01-17 (×5): 5 mg via ORAL
  Filled 2014-01-12 (×5): qty 1

## 2014-01-12 MED ORDER — ALBUTEROL SULFATE HFA 108 (90 BASE) MCG/ACT IN AERS
2.0000 | INHALATION_SPRAY | Freq: Four times a day (QID) | RESPIRATORY_TRACT | Status: DC
  Administered 2014-01-12: 2 via RESPIRATORY_TRACT
  Filled 2014-01-12: qty 6.7

## 2014-01-12 NOTE — Progress Notes (Signed)
Utilization Review completed.  Vermelle Cammarata RN CM  

## 2014-01-12 NOTE — ED Notes (Signed)
md at bedside

## 2014-01-12 NOTE — H&P (Addendum)
[facility] History and Physical  KENNEITH STIEF ENI:[id number] DOB: Sep 18, 1941 DOA: 01/12/2014  Referring physician: Dr. Aline Brochure PCP: [name], MD   Primary  oncologist: Dr. [name]  Chief Complaint:  Cough With hemoptysis and increased shortness of breath since one day  HPI:  73 year old male with past medical hx of stage IIIa non-small cell lung cancer received  radiation therapy and chemotherapy about 6 weeks back, (currently started on consolidation chemotherapy 3 weeks back), hypertension, diabetes mellitus, CAD, hyperlipidemia presented with increased cough with hemoptysis since yesterday associated with increased shortness of breath. Patient reports increased cough with scanty hemoptysis since 3 weeks however since last evening he has noticed increased hemoptysis with coughing up of blood clots and increasingly getting short of breath with minimal activity. Patient denies headache, dizziness, fever, chills, nausea , vomiting, chest pain, palpitations, abdominal pain, bowel or urinary symptoms. Denies change in weight or appetite.  Course in the ED Patient had a temperature of 99.72F was tachycardic between 110 to 130, and tachypnic. Blood pressure was stable. Blood work done showed mild leukocytosis with WBC of 11.1, hemoglobin of 12.9 and PTT of 38.3, platelets of 240. Chemistry was normal. Chest x-ray showed possible acute infiltrate lower right lower lobe . Patient was given a dose of IV Levaquin. Patient was planned for discharge home but became hypoxic with drop in O2 sat to 83 on room air with minimal ambulation. A CT angiogram of the chest was done to rule out for PE and was negative. It showed an unchanged right hilar mass and patchy ground glass and septal thickening possibly from pulmonary edema versus pulmonary hemorrhage. Prior hospitalists requested to admit patient to medical floor under observation given hypoxia and hemoptysis.  Review of Systems:   Constitutional: Denies fever, chills, diaphoresis, appetite change and fatigue.  HEENT: Denies photophobia, eye pain,  hearing loss, ear pain, congestion, sore throat, rhinorrhea, sneezing, mouth sores, trouble swallowing, neck pain, neck stiffness and tinnitus.   reports nasal congestion with some epistaxis Respiratory:  SOB, DOE, cough and hemoptysis, denies chest tightness,  and wheezing.   Cardiovascular: Denies chest pain, palpitations and leg swelling.  Gastrointestinal: Denies nausea, vomiting, abdominal pain, diarrhea, constipation, blood in stool and abdominal distention.  Genitourinary: Denies dysuria, urgency, frequency, hematuria, flank pain and difficulty urinating.  Endocrine: Denies polyuria, polydipsia. Musculoskeletal: Denies myalgias, back pain, joint swelling, arthralgias and gait problem.  Skin: Denies pallor, rash and wound.  Neurological: Denies dizziness, seizures, syncope, weakness, light-headedness, numbness and headaches.  Psychiatric/Behavioral: Denies  confusion, nervousness, sleep disturbance and agitation   Past Medical History  Diagnosis Date  . Coronary artery disease     s/p PCI 2000 and single vessel CABG 2001  . Hypercholesterolemia   . GERD (gastroesophageal reflux disease)   . Tubular adenoma 2003  . COPD (chronic obstructive pulmonary disease)   . Diabetes mellitus   . Arthritis   . Allergic rhinitis   . Cancer     lung mass  . Hypertension     Dr Burt Knack  . History of radiation therapy 09/23/2013-11/12/2013    63 gray to right central chest   Past Surgical History  Procedure Laterality Date  . Left rotator cuff repair    . Left knee arthroscopy   x2    for meniscal tear  . Coronary stent placement  2001  . Right knee arthroscopy    . Bilateral carpal tunnel release    . Right shoulder arthroscopy    . Coronary artery bypass  graft      occlusion of the stent and underwent one-vessel CABG in 2002  . Coronary angioplasty    . Colonoscopy   09/13/2011    Procedure: COLONOSCOPY;  Surgeon: Rogene Houston, MD;  [facility];  Service: Endoscopy;  Laterality: N/A;  . Cataract extraction w/phaco  09/25/2011    Procedure: CATARACT EXTRACTION PHACO AND INTRAOCULAR LENS PLACEMENT (IOC);  Surgeon: Tonny Branch;  Location: [facility];  Service: Ophthalmology;  Laterality: Left;  CDE:10.79  . Cataract extraction w/phaco  10/05/2011    Procedure: CATARACT EXTRACTION PHACO AND INTRAOCULAR LENS PLACEMENT (IOC);  Surgeon: Tonny Branch;  Location: [facility];  Service: Ophthalmology;  Laterality: Right;  CDE: 8.45  . Video bronchoscopy with endobronchial ultrasound N/A 09/08/2013    Procedure: VIDEO BRONCHOSCOPY WITH ENDOBRONCHIAL ULTRASOUND;  Surgeon: Grace Isaac, MD;  Location: [facility];  Service: Thoracic;  Laterality: N/A;   Social History:  reports that he quit smoking about 18 years ago. His smoking use included Cigarettes. He has a 60 pack-year smoking history. He does not have any smokeless tobacco history on file. He reports that he drinks about 7.0 ounces of alcohol per week. He reports that he does not use illicit drugs.  No Known Allergies  Family History  Problem Relation Age of Onset  . Heart failure Mother   . Cancer Father     lung canger  . Cancer Brother     lung cancer  . Anesthesia problems Neg Hx   . Hypotension Neg Hx   . Malignant hyperthermia Neg Hx   . Pseudochol deficiency Neg Hx     Prior to Admission medications   Medication Sig Start Date End Date Taking? Authorizing Provider  amLODipine-valsartan (EXFORGE) 5-160 MG per tablet Take 1 tablet by mouth daily.   Yes Historical Provider, MD  carboxymethylcellulose (REFRESH PLUS) 0.5 % SOLN Place 1 drop into both eyes 3 (three) times daily as needed (dry eyes).   Yes Historical Provider, MD  dextromethorphan (DELSYM) 30 MG/5ML liquid Take 60 mg by mouth as needed for cough.   Yes Historical Provider, MD  HYDROcodone-acetaminophen (NORCO/VICODIN) 5-325 MG per  tablet Take 1 tablet by mouth every 6 (six) hours as needed for moderate pain. 01/08/14  Yes Carlton Adam, PA-C  hydroxychloroquine (PLAQUENIL) 200 MG tablet Take 200 mg by mouth 2 (two) times daily.     Yes Historical Provider, MD  metFORMIN (GLUCOPHAGE) 500 MG tablet Take 500 mg by mouth 2 (two) times daily with a meal.     Yes Historical Provider, MD  metoprolol succinate (TOPROL-XL) 25 MG 24 hr tablet Take 25 mg by mouth daily.   Yes Historical Provider, MD  omeprazole (PRILOSEC) 20 MG capsule Take 20 mg by mouth every morning.    Yes Historical Provider, MD  pravastatin (PRAVACHOL) 20 MG tablet Take 20 mg by mouth daily.   Yes Historical Provider, MD  PRESCRIPTION MEDICATION CARBOplatin (PARAPLATIN) 570 mg in sodium chloride 0.9 % 250 mL chemo infusion 570 mg  Once 01/01/2014   Yes Historical Provider, MD  PRESCRIPTION MEDICATION PACLitaxel (TAXOL) 372 mg in dextrose 5 % 500 mL chemo infusion (> 80mg /m2) 372 mg  Once 01/01/2014   Yes Historical Provider, MD  sitaGLIPtin (JANUVIA) 100 MG tablet Take 100 mg by mouth every morning.    Yes Historical Provider, MD  levofloxacin (LEVAQUIN) 500 MG tablet Take 1 tablet (500 mg total) by mouth daily. 01/12/14   Carmin Muskrat, MD  Physical Exam:  Filed Vitals:   01/12/14 1843 01/12/14 1853 01/12/14 1858 01/12/14 2011  BP:   117/63 140/72  Pulse: 131 112 115 113  Temp:   98.7 F (37.1 C) 98.2 F (36.8 C)  TempSrc:   Oral Oral  Resp: 26 22 20 22   Height:    5\' 9"  (1.753 m)  Weight:    90.266 kg (199 lb)  SpO2: 83% 97%  98%    Constitutional: Vital signs reviewed.  Patient is an elderly male in no acute distress. HEENT: no pallor, no icterus, moist oral mucosa, no cervical lymphadenopathy Cardiovascular: RRR, S1 normal, S2 normal, no MRG Chest: Bilateral equal air entry, fine bibasilar crackles, no rhonchi or wheeze Abdominal: Soft. Non-tender, non-distended, bowel sounds are normal, no masses, organomegaly, or guarding present.  Ext:  warm, no edema Neurological: A&O x3, non focal  Labs on Admission:  Basic Metabolic Panel:  Recent Labs Lab 01/08/14 1000 01/12/14 1345  NA 135* 135*  K 4.5 4.4  CL  --  94*  CO2 25 24  GLUCOSE 224* 223*  BUN 18.6 10  CREATININE 1.1 0.89  CALCIUM 10.1 9.1   Liver Function Tests:  Recent Labs Lab 01/08/14 1000  AST 13  ALT 14  ALKPHOS 115  BILITOT 0.52  PROT 7.1  ALBUMIN 3.1*   No results found for this basename: LIPASE, AMYLASE,  in the last 168 hours No results found for this basename: AMMONIA,  in the last 168 hours CBC:  Recent Labs Lab 01/08/14 1000 01/12/14 1345  WBC 7.3 11.1*  NEUTROABS 6.1  --   HGB 13.1 12.9*  HCT 38.8 38.3*  MCV 95.7 93.9  PLT 207 240   Cardiac Enzymes: No results found for this basename: CKTOTAL, CKMB, CKMBINDEX, TROPONINI,  in the last 168 hours BNP: No components found with this basename: POCBNP,  CBG: No results found for this basename: GLUCAP,  in the last 168 hours  Radiological Exams on Admission: Ct Angio Chest Pe W/cm &/or Wo Cm  01/12/2014   CLINICAL DATA:  Shortness of breath and hemoptysis. Hypoxia. Lung cancer. Received radiation therapy within last 3 months.  EXAM: CT ANGIOGRAPHY CHEST WITH CONTRAST  TECHNIQUE: Multidetector CT imaging of the chest was performed using the standard protocol during bolus administration of intravenous contrast. Multiplanar CT image reconstructions and MIPs were obtained to evaluate the vascular anatomy.  CONTRAST:  11mL OMNIPAQUE IOHEXOL 350 MG/ML SOLN  COMPARISON:  CT CHEST W/CM dated 12/19/2013  FINDINGS: Respiratory motion somewhat degrades image quality. No definite pulmonary embolus. Right hilar adenopathy measures 2.5 x 3.1 cm, likely stable from 12/19/2013. Mediastinal lymph nodes are not enlarged by CT size criteria. No axillary adenopathy. Heart is at the upper limits of normal in size. No pericardial effusion.  Mild centrilobular emphysema. There is a basilar distribution of  subpleural reticulation, ground-glass, architectural distortion, and traction bronchiectasis/bronchiolectasis. Septal thickening appears increased from 12/19/2013. There are areas of patchy central parenchymal involvement, right greater than left. No pleural fluid. Narrowing of right upper lobe bronchi. Airway is otherwise unremarkable.  Incidental imaging of the upper at shows a sub cm low-attenuation lesion in the left hepatic lobe, as before. Visualized portions of the liver, adrenal glands, spleen and stomach are otherwise unremarkable, with exception of a tiny hiatal hernia. No worrisome lytic or sclerotic lesions. Degenerative changes are seen throughout spine. Remote median sternotomy.  Review of the MIP images confirms the above findings.  IMPRESSION: 1. Image quality is somewhat degraded by  respiratory motion. No definite pulmonary embolus. 2. Right hilar mass is likely stable from 12/19/2013. 3. Patchy ground-glass and septal thickening may be due to pulmonary edema and/or pulmonary hemorrhage. 4. 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   Electronically Signed   By: Lorin Picket M.D.   On: 01/12/2014 18:04   Dg Chest Port 1 View  01/12/2014   CLINICAL DATA:  Shortness of breath and weakness  EXAM: PORTABLE CHEST - 1 VIEW  COMPARISON:  12/19/2013  FINDINGS: Cardiac shadow is stable. Postoperative changes are noted. Changes are noted in the right mid and lower lung similar to that seen on the prior exam however there is a superimposed component of increased opacification. This may represent acute on chronic infiltrate. Correlation with the clinical exam is recommended. The left lung remains clear. No bony abnormality is seen.  IMPRESSION: Likely acute on chronic infiltrate.   Electronically Signed   By: Inez Catalina M.D.   On: 01/12/2014 14:07    EKG:    Assessment/Plan  Principal Problem:   Hemoptysis with hypoxia in the setting of underlying lung cancer .  Check sputum for cx. Ordered type and screen sats maintained on 2L via [place]. -Hemodynamically stable at present. H&H are baseline. Monitor every 8 hours. We will add guaifenesin with codeine for cough and hemoptysis. Continue gentle IV hydration. I have discussed with oncology on call Dr. Nonda Lou I would recommend to monitor closely for now and if hemoptysis worsens or has acute drop in hemoglobin will need intervention in the form of thoracic embolization by CTS or IR. -We'll add his primary oncologist Dr. [name] as consult and should be notified in the morning  Active Problems:   HYPERLIPIDEMIA-MIXED Resume statin    HYPERTENSION, BENIGN Resume home blood pressure medication    Malignant neoplasm of upper lobe, bronchus or lung Receiving consolidation chemotherapy as outpatient. Last radiation therapy 6 weeks back.   Community acquired pneumonia continue IV Levaquin. Follow Sputum cx,  02 via [place] prn. Continue albuterol  nebs    Diabetes mellitus Hold metformin.  monitor fsg. continue SSI  Diet: Clear liquids  DVT prophylaxis: SCDs   Code Status:full code Family Communication: Wife at bedside Disposition Plan: Home once stable  Jenavee Laguardia, [place] [facility] Pager [phone]  Total time spent on admission :50 minutes  If 7PM-7AM, please contact night-coverage [email or web address] Password [facility] 01/12/2014, 8:38 PM

## 2014-01-12 NOTE — ED Notes (Signed)
Pt ambulated on room air. 83% on room air HR 128, md harrison made aware

## 2014-01-12 NOTE — ED Notes (Signed)
Report given to Ailene Ravel, RN on floor

## 2014-01-12 NOTE — ED Provider Notes (Signed)
CSN: [id number]     Arrival date & time 01/12/14  1310 History   First MD Initiated Contact with Patient 01/12/14 1327     Chief Complaint  Patient presents with  . Shortness of Breath      HPI  Patient presents with concern of ongoing cough, congestion, generalized discomfort.  He has negligible chest pain when he is not coughing. Patient has had some relief with OTC medication. Symptoms have been present for approximately one week.  Patient has a notable history of lung cancer for which he is currently receiving chemotherapy, last session was one week ago. Patient denies fever, lightheadedness, syncope, dyspnea. He denies lower extremity edema, superficial changes. He has no history of DVT, though he does have a history of CAD.   Past Medical History  Diagnosis Date  . Coronary artery disease     s/p PCI 2000 and single vessel CABG 2001  . Hypercholesterolemia   . GERD (gastroesophageal reflux disease)   . Tubular adenoma 2003  . COPD (chronic obstructive pulmonary disease)   . Diabetes mellitus   . Arthritis   . Allergic rhinitis   . Cancer     lung mass  . Hypertension     Dr Burt Knack  . History of radiation therapy 09/23/2013-11/12/2013    63 gray to right central chest   Past Surgical History  Procedure Laterality Date  . Left rotator cuff repair    . Left knee arthroscopy   x2    for meniscal tear  . Coronary stent placement  2001  . Right knee arthroscopy    . Bilateral carpal tunnel release    . Right shoulder arthroscopy    . Coronary artery bypass graft      occlusion of the stent and underwent one-vessel CABG in 2002  . Coronary angioplasty    . Colonoscopy  09/13/2011    Procedure: COLONOSCOPY;  Surgeon: Rogene Houston, MD;  [facility];  Service: Endoscopy;  Laterality: N/A;  . Cataract extraction w/phaco  09/25/2011    Procedure: CATARACT EXTRACTION PHACO AND INTRAOCULAR LENS PLACEMENT (IOC);  Surgeon: Tonny Branch;  Location: [facility];   Service: Ophthalmology;  Laterality: Left;  CDE:10.79  . Cataract extraction w/phaco  10/05/2011    Procedure: CATARACT EXTRACTION PHACO AND INTRAOCULAR LENS PLACEMENT (IOC);  Surgeon: Tonny Branch;  Location: [facility];  Service: Ophthalmology;  Laterality: Right;  CDE: 8.45  . Video bronchoscopy with endobronchial ultrasound N/A 09/08/2013    Procedure: VIDEO BRONCHOSCOPY WITH ENDOBRONCHIAL ULTRASOUND;  Surgeon: Grace Isaac, MD;  Location: [facility];  Service: Thoracic;  Laterality: N/A;   Family History  Problem Relation Age of Onset  . Heart failure Mother   . Cancer Father     lung canger  . Cancer Brother     lung cancer  . Anesthesia problems Neg Hx   . Hypotension Neg Hx   . Malignant hyperthermia Neg Hx   . Pseudochol deficiency Neg Hx    History  Substance Use Topics  . Smoking status: Former Smoker -- 1.50 packs/day for 40 years    Types: Cigarettes    Quit date: 09/19/1995  . Smokeless tobacco: Not on file  . Alcohol Use: 7.0 oz/week    14 drink(s) per week     Comment: scotch 1 shot daily    Review of Systems  Constitutional:       Per HPI, otherwise negative  HENT:       Per HPI, otherwise  negative  Respiratory:       Per HPI, otherwise negative  Cardiovascular:       Per HPI, otherwise negative  Gastrointestinal: Negative for vomiting.  Endocrine:       Negative aside from HPI  Genitourinary:       Neg aside from HPI   Musculoskeletal:       Per HPI, otherwise negative  Skin: Negative.   Neurological: Negative for syncope.      Allergies  Review of patient's allergies indicates no known allergies.  Home Medications   Current Outpatient Rx  Name  Route  Sig  Dispense  Refill  . amLODipine-valsartan (EXFORGE) 5-160 MG per tablet   Oral   Take 1 tablet by mouth daily.         . carboxymethylcellulose (REFRESH PLUS) 0.5 % SOLN   Both Eyes   Place 1 drop into both eyes 3 (three) times daily as needed (dry eyes).         Marland Kitchen dextromethorphan  (DELSYM) 30 MG/5ML liquid   Oral   Take 60 mg by mouth as needed for cough.         Marland Kitchen HYDROcodone-acetaminophen (NORCO/VICODIN) 5-325 MG per tablet   Oral   Take 1 tablet by mouth every 6 (six) hours as needed for moderate pain.   30 tablet   0   . hydroxychloroquine (PLAQUENIL) 200 MG tablet   Oral   Take 200 mg by mouth 2 (two) times daily.           . metFORMIN (GLUCOPHAGE) 500 MG tablet   Oral   Take 500 mg by mouth 2 (two) times daily with a meal.           . metoprolol succinate (TOPROL-XL) 25 MG 24 hr tablet   Oral   Take 25 mg by mouth daily.         Marland Kitchen omeprazole (PRILOSEC) 20 MG capsule   Oral   Take 20 mg by mouth every morning.          . pravastatin (PRAVACHOL) 20 MG tablet   Oral   Take 20 mg by mouth daily.         Marland Kitchen PRESCRIPTION MEDICATION      CARBOplatin (PARAPLATIN) 570 mg in sodium chloride 0.9 % 250 mL chemo infusion 570 mg  Once 01/01/2014         . PRESCRIPTION MEDICATION      PACLitaxel (TAXOL) 372 mg in dextrose 5 % 500 mL chemo infusion (> 80mg /m2) 372 mg  Once 01/01/2014         . sitaGLIPtin (JANUVIA) 100 MG tablet   Oral   Take 100 mg by mouth every morning.          Marland Kitchen levofloxacin (LEVAQUIN) 500 MG tablet   Oral   Take 1 tablet (500 mg total) by mouth daily.   10 tablet   0    BP 110/72  Pulse 114  Temp(Src) 97.9 F (36.6 C) (Oral)  Resp 28  SpO2 97% Physical Exam  Nursing note and vitals reviewed. Constitutional: He is oriented to person, place, and time. He appears well-developed. No distress.  HENT:  Head: Normocephalic and atraumatic.  Eyes: Conjunctivae and EOM are normal.  Cardiovascular: Regular rhythm.  Tachycardia present.   Pulmonary/Chest: Breath sounds normal. No accessory muscle usage or stridor. Tachypnea noted. No respiratory distress.  Abdominal: He exhibits no distension.  Musculoskeletal: He exhibits no edema.  Neurological: He is alert and  oriented to person, place, and time.  Skin: Skin  is warm and dry.  Psychiatric: He has a normal mood and affect.    ED Course  Procedures (including critical care time) Labs Review Labs Reviewed  CBC - Abnormal; Notable for the following:    WBC 11.1 (*)    RBC 4.08 (*)    Hemoglobin 12.9 (*)    HCT 38.3 (*)    All other components within normal limits  BASIC METABOLIC PANEL - Abnormal; Notable for the following:    Sodium 135 (*)    Chloride 94 (*)    Glucose, Bld 223 (*)    GFR calc non Af Amer 83 (*)    All other components within normal limits  PRO B NATRIURETIC PEPTIDE - Abnormal; Notable for the following:    Pro B Natriuretic peptide (BNP) 204.4 (*)    All other components within normal limits  POCT I-STAT TROPONIN I   Imaging Review Dg Chest Port 1 View  01/12/2014   CLINICAL DATA:  Shortness of breath and weakness  EXAM: PORTABLE CHEST - 1 VIEW  COMPARISON:  12/19/2013  FINDINGS: Cardiac shadow is stable. Postoperative changes are noted. Changes are noted in the right mid and lower lung similar to that seen on the prior exam however there is a superimposed component of increased opacification. This may represent acute on chronic infiltrate. Correlation with the clinical exam is recommended. The left lung remains clear. No bony abnormality is seen.  IMPRESSION: Likely acute on chronic infiltrate.   Electronically Signed   By: Inez Catalina M.D.   On: 01/12/2014 14:07    EKG Interpretation    Date/Time:  Monday January 12 2014 13:27:37 EST Ventricular Rate:  118 PR Interval:  174 QRS Duration: 98 QT Interval:  329 QTC Calculation: 461 R Axis:   47 Text Interpretation:  Sinus tachycardia Probable left atrial enlargement Borderline repolarization abnormality Sinus tachycardia Non-specific intra-ventricular conduction delay Abnormal ekg Confirmed by Carmin Muskrat  MD [phone]) on 01/12/2014 4:17:38 PM           Update: On repeat exam the patient states that he feels substantially better.  Vital signs are  normalizing, the patient remains mildly tachycardic. We discussed all results, the patient's cancer history at length.  Patient voices preference for outpatient management. MDM   Final diagnoses:  CAP (community acquired pneumonia)    This patient with a notable oncologic history of lung cancer presents with ongoing cough, congestion.  Notably, the patient is in no distress, though he is tachycardic, his otherwise hemodynamically stable.  The patient improved subjectively here.  Patient's evaluation is most consistent with pneumonia, though other considerations include progression of disease and PE were considered.  Patient has followup capacity, will followup tomorrow with his oncologist for further evaluation and management.  This improvement, his hemodynamic instability, the absence of distress, he was discharged in stable condition though his overall prognosis is guarded.    Carmin Muskrat, MD 01/12/14 [phone]

## 2014-01-12 NOTE — Progress Notes (Signed)
   CARE MANAGEMENT ED NOTE 01/12/2014  Patient:  David [name], David [name]   Account Number:  [id number]  Date Initiated:  01/12/2014  Documentation initiated by:  Livia Snellen  Subjective/Objective Assessment:   Patient presents to ED with increased shortness of breath and coughing up blood.     Subjective/Objective Assessment Detail:   Patient with pmhx of lung cancer receiving chemotherapy every three weeks.  Pulse ox decreased to 83% on room air upon ambulation.     Action/Plan:   Patient given  NSS bolus, IV antibiotics and inhaler in the ED.   Action/Plan Detail:   Patient to be admitted   Anticipated DC Date:       Status Recommendation to Physician:   Result of Recommendation:    Other ED Services  Consult Working [place]  Other    Choice offered to / List presented to:            Status of service:  Completed, signed off  ED Comments:   ED Comments Detail:  EDCM spoke to patient and his wife at bedside.  Patient's wife is David [name] [phone].  Patient reports he lives at home with his wife.  As per patient, he has some walkers at home but they belonged to his wife's mother.  Patient has never had home health services at home.  Patient currently wearing O2 in the ED.  Patient reports he does NOT wear oxygen at home.  EDCM explained to patient that with home health services he may receive a visiting RN, PT, OT aide and social worker if needed.  Patient and wife thankful for information.  Patient axious to get upstairs to his room. No further EDCM needs ta this time.

## 2014-01-12 NOTE — ED Notes (Signed)
Pt to CT

## 2014-01-12 NOTE — Progress Notes (Signed)
Discussed admission status with Dr. Clementeen Graham.

## 2014-01-12 NOTE — ED Notes (Signed)
MD at bedside. 

## 2014-01-12 NOTE — ED Notes (Addendum)
Pt reports hx of lung cancer, has been through 1 round of radiation and chemo, now on 2nd round of chemo, gets chemo every 3 weeks. Next due Feb 19th. Pt reports since last chemo intermittent increased SOB and coughing up blood. Reports breathing difficulty increased yesterday. Lung sounds diminished. 86% on room air. Pt not able to speak in full sentences. Denies N/V/D  Pt reports hx of stent and bipass. Reports intermittent chest pain when coughing.

## 2014-01-12 NOTE — Telephone Encounter (Signed)
Pt called back stating he is still having bloody sputum that has not changed and he is also more SOB.  Per Dr Vista Mink pt needs to go to the ED.  Pt states he will go to [facility] ED.  SLJ

## 2014-01-12 NOTE — ED Provider Notes (Signed)
5:34 PM patient found to have oxygen desaturation to the low 80s upon ambulation. Nursing requested that I re evaluate the patient. He continues to appear well. Given his ongoing hemoptysis and hypoxia. Will get CTA to rule out pulmonary embolism.   No PE seen. Again ambulated patient who desaturated to low 80's on pulse oximetry. Will admit to [facility].   Clinical Impression 1. CAP (community acquired pneumonia)   2. Hypoxia   3. Hemoptysis   4. Malignant neoplasm of upper lobe, bronchus or lung      Blanchard Kelch, MD 01/13/14 1255

## 2014-01-13 DIAGNOSIS — I10 Essential (primary) hypertension: Secondary | ICD-10-CM

## 2014-01-13 DIAGNOSIS — R0602 Shortness of breath: Secondary | ICD-10-CM

## 2014-01-13 DIAGNOSIS — J449 Chronic obstructive pulmonary disease, unspecified: Secondary | ICD-10-CM

## 2014-01-13 DIAGNOSIS — E119 Type 2 diabetes mellitus without complications: Secondary | ICD-10-CM

## 2014-01-13 LAB — HEMOGLOBIN AND HEMATOCRIT, BLOOD
HCT: 31.9 % — ABNORMAL LOW (ref 39.0–52.0)
HCT: 35 % — ABNORMAL LOW (ref 39.0–52.0)
HCT: 33.6 % — ABNORMAL LOW (ref 39.0–52.0)
Hemoglobin: 11 g/dL — ABNORMAL LOW (ref 13.0–17.0)
Hemoglobin: 12 g/dL — ABNORMAL LOW (ref 13.0–17.0)
Hemoglobin: 11.6 g/dL — ABNORMAL LOW (ref 13.0–17.0)

## 2014-01-13 LAB — OCCULT BLOOD X 1 CARD TO LAB, STOOL: FECAL OCCULT BLD: POSITIVE — AB

## 2014-01-13 LAB — BASIC METABOLIC PANEL
BUN: 8 mg/dL (ref 6–23)
CALCIUM: 8 mg/dL — AB (ref 8.4–10.5)
CO2: 23 mEq/L (ref 19–32)
Chloride: 92 mEq/L — ABNORMAL LOW (ref 96–112)
Creatinine, Ser: 0.78 mg/dL (ref 0.50–1.35)
GFR, EST NON AFRICAN AMERICAN: 88 mL/min — AB (ref >90)
Glucose, Bld: 224 mg/dL — ABNORMAL HIGH (ref 70–99)
POTASSIUM: 3.5 meq/L — AB (ref 3.7–5.3)
Sodium: 129 mEq/L — ABNORMAL LOW (ref 137–147)

## 2014-01-13 LAB — GLUCOSE, CAPILLARY
GLUCOSE-CAPILLARY: 244 mg/dL — AB (ref 70–99)
Glucose-Capillary: 197 mg/dL — ABNORMAL HIGH (ref 70–99)
Glucose-Capillary: 211 mg/dL — ABNORMAL HIGH (ref 70–99)
Glucose-Capillary: 118 mg/dL — ABNORMAL HIGH (ref 70–99)

## 2014-01-13 LAB — ABO/RH: ABO/RH(D): A POS

## 2014-01-13 LAB — CLOSTRIDIUM DIFFICILE BY PCR: Toxigenic C. Difficile by PCR: NEGATIVE

## 2014-01-13 MED ORDER — MUPIROCIN 2 % EX OINT
TOPICAL_OINTMENT | Freq: Three times a day (TID) | CUTANEOUS | Status: DC
  Administered 2014-01-13 – 2014-01-17 (×10): via NASAL
  Filled 2014-01-13: qty 22

## 2014-01-13 MED ORDER — OXYMETAZOLINE HCL 0.05 % NA SOLN
3.0000 | Freq: Three times a day (TID) | NASAL | Status: DC
  Administered 2014-01-13 – 2014-01-17 (×10): 3 via NASAL
  Filled 2014-01-13: qty 15

## 2014-01-13 NOTE — Progress Notes (Signed)
DIAGNOSIS: Stage IIIA (T2a, N2, M0) non-small cell lung cancer, squamous cell carcinoma presented with large right upper lobe mass as well as mediastinal lymphadenopathy diagnosed in October of 2014   PRIOR THERAPY: Concurrent chemoradiation with weekly carboplatin for AUC of 2 and paclitaxel 45 mg/M2 status post 7 cycles. Last dose was given 11/03/2013 with partial response.   CURRENT THERAPY: Consolidation chemotherapy with carboplatin for an AUC of 5 and paclitaxel 175 mg per meter squared with Neulasta support given every 3 weeks. Status post 1 cycle   CHEMOTHERAPY INTENT: Control/curative  CURRENT # OF CHEMOTHERAPY CYCLES: 1  CURRENT ANTIEMETICS: Zofran, dexamethasone and Compazine  CURRENT SMOKING STATUS: Former smoker  ORAL CHEMOTHERAPY AND CONSENT: None  CURRENT BISPHOSPHONATES USE: None  PAIN MANAGEMENT: 0/10  NARCOTICS INDUCED CONSTIPATION: None  LIVING WILL AND CODE STATUS: Full code   Subjective: The patient is seen and examined today. He was admitted last night with shortness of breath and hemoptysis. He continues to have hemoptysis this morning. He denied having any significant fever or chills. He has no nausea or vomiting. The patient denied having any significant chest pain. CT angiogram of the chest performed last night showed no evidence for pulmonary embolus or any evidence for disease progression.  Objective: Vital signs in last 24 hours: Temp:  [97.9 F (36.6 C)-99.5 F (37.5 C)] 98.3 F (36.8 C) (02/10 0500) Pulse Rate:  [109-131] 109 (02/10 0500) Resp:  [16-29] 22 (02/10 0500) BP: (103-140)/(50-72) 132/70 mmHg (02/10 0500) SpO2:  [83 %-99 %] 99 % (02/10 0500) Weight:  [199 lb (90.266 kg)] 199 lb (90.266 kg) (02/09 2011)  Intake/Output from previous day: 02/09 0701 - 02/10 0700 In: 617.5 [I.V.:617.5] Out: 800 [Urine:800] Intake/Output this shift:    General appearance: alert, cooperative and no distress Resp: clear to auscultation bilaterally Cardio:  regular rate and rhythm, S1, S2 normal, no murmur, click, rub or gallop GI: soft, non-tender; bowel sounds normal; no masses,  no organomegaly Extremities: extremities normal, atraumatic, no cyanosis or edema  Lab Results:   Recent Labs  01/12/14 1345 01/13/14 0345  WBC 11.1*  --   HGB 12.9* 11.0*  HCT 38.3* 31.9*  PLT 240  --    BMET  Recent Labs  01/12/14 1345 01/13/14 0345  NA 135* 129*  K 4.4 3.5*  CL 94* 92*  CO2 24 23  GLUCOSE 223* 224*  BUN 10 8  CREATININE 0.89 0.78  CALCIUM 9.1 8.0*    Studies/Results: Ct Angio Chest Pe W/cm &/or Wo Cm  01/12/2014   CLINICAL DATA:  Shortness of breath and hemoptysis. Hypoxia. Lung cancer. Received radiation therapy within last 3 months.  EXAM: CT ANGIOGRAPHY CHEST WITH CONTRAST  TECHNIQUE: Multidetector CT imaging of the chest was performed using the standard protocol during bolus administration of intravenous contrast. Multiplanar CT image reconstructions and MIPs were obtained to evaluate the vascular anatomy.  CONTRAST:  130mL OMNIPAQUE IOHEXOL 350 MG/ML SOLN  COMPARISON:  CT CHEST W/CM dated 12/19/2013  FINDINGS: Respiratory motion somewhat degrades image quality. No definite pulmonary embolus. Right hilar adenopathy measures 2.5 x 3.1 cm, likely stable from 12/19/2013. Mediastinal lymph nodes are not enlarged by CT size criteria. No axillary adenopathy. Heart is at the upper limits of normal in size. No pericardial effusion.  Mild centrilobular emphysema. There is a basilar distribution of subpleural reticulation, ground-glass, architectural distortion, and traction bronchiectasis/bronchiolectasis. Septal thickening appears increased from 12/19/2013. There are areas of patchy central parenchymal involvement, right greater than left. No pleural fluid. Narrowing  of right upper lobe bronchi. Airway is otherwise unremarkable.  Incidental imaging of the upper at shows a sub cm low-attenuation lesion in the left hepatic lobe, as before.  Visualized portions of the liver, adrenal glands, spleen and stomach are otherwise unremarkable, with exception of a tiny hiatal hernia. No worrisome lytic or sclerotic lesions. Degenerative changes are seen throughout spine. Remote median sternotomy.  Review of the MIP images confirms the above findings.  IMPRESSION: 1. Image quality is somewhat degraded by respiratory motion. No definite pulmonary embolus. 2. Right hilar mass is likely stable from 12/19/2013. 3. Patchy ground-glass and septal thickening may be due to pulmonary edema and/or pulmonary hemorrhage. 4. 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   Electronically Signed   By: Lorin Picket M.D.   On: 01/12/2014 18:04   Dg Chest Port 1 View  01/12/2014   CLINICAL DATA:  Shortness of breath and weakness  EXAM: PORTABLE CHEST - 1 VIEW  COMPARISON:  12/19/2013  FINDINGS: Cardiac shadow is stable. Postoperative changes are noted. Changes are noted in the right mid and lower lung similar to that seen on the prior exam however there is a superimposed component of increased opacification. This may represent acute on chronic infiltrate. Correlation with the clinical exam is recommended. The left lung remains clear. No bony abnormality is seen.  IMPRESSION: Likely acute on chronic infiltrate.   Electronically Signed   By: Inez Catalina M.D.   On: 01/12/2014 14:07    Medications: I have reviewed the patient's current medications.  CODE STATUS: Full code  Assessment/Plan: 1) stage IIIA non-small cell lung cancer currently undergoing consolidation chemotherapy with carboplatin and paclitaxel is status post 1 cycle. He tolerated the first cycle well except for a few days of fatigue and weakness after the Neulasta injection and this improved after the patient started treatment with Claritin and pain medication. He is scheduled for cycle #2  of his chemotherapy in less than 2 weeks.  2) hemoptysis: Of unclear etiology but this could be secondary to endobronchial lesion. I would strongly recommend pulmonary consult for consideration of repeat bronchoscopy and evaluation of his hemoptysis.  3) COPD, diabetes mellitus and hypertension: Continue current medications. Thank you for taking good care of Mr. Paredez, I will continue to follow the patient with you and assist in his management on as-needed basis.  Disclaimer: This note was dictated with voice recognition software. Similar sounding words can inadvertently be transcribed and may not be corrected upon review.    LOS: 1 day    Jonmichael Beadnell K. 01/13/2014

## 2014-01-13 NOTE — Progress Notes (Signed)
INITIAL NUTRITION ASSESSMENT  DOCUMENTATION CODES Per approved criteria  -Not Applicable   INTERVENTION: Diet advancement per MD Will continue to monitor  NUTRITION DIAGNOSIS: Inadequate oral intake related to bloody coughing episodes as evidenced by PO intake <75% for 4 days.   Goal: Pt to meet >/= 90% of their estimated nutrition needs    Monitor:  Diet order, Total protein/energy intake, labs, weights  Reason for Assessment: MST  73 y.o. male  Admitting Dx: Hemoptysis  ASSESSMENT: 73 year old male with past medical hx of stage IIIa non-small cell lung cancer received radiation therapy and chemotherapy about 6 weeks back, (currently started on consolidation chemotherapy 3 weeks back), hypertension, diabetes mellitus, CAD, hyperlipidemia presented with increased cough with hemoptysis since yesterday associated with increased shortness of breath. Patient reports increased cough with scanty hemoptysis since 3 weeks however since last evening he has noticed increased hemoptysis with coughing up of blood clots and increasingly getting short of breath with minimal activity.  -Pt with decreased intake since Saturday 2/7 d/t frequent episodes of bloody coughing. Was able to tolerate liquids. Diet has consisted of Ensure twice daily in past 4 days -Has lost approximately 3-4 lbs in past 4 days d/t decreased intake -Denied any significant changes in appetite or weight prior to 2/07 -Tolerating clear liquid diet. Eager for diet advancement -Provided pt with Resource Breeze to sample for additional nutrients while on liquid diet -Pt declined additional supplement with diet advancement as he preferred nutrition through solid food sources -Has had loose stools per MD note -Evaluated by outpatient RD in 09/2013. Provided information regarding high protein food sources to include in diet. No additional questions at this time -Will continue to monitor PO intake and supplement as  warranted   Height: Ht Readings from Last 1 Encounters:  01/12/14 5\' 9"  (1.753 m)    Weight: Wt Readings from Last 1 Encounters:  01/12/14 199 lb (90.266 kg)    Ideal Body Weight: 160 lbs  % Ideal Body Weight: 124%  Wt Readings from Last 10 Encounters:  01/12/14 199 lb (90.266 kg)  01/08/14 201 lb (91.173 kg)  12/22/13 208 lb 6.4 oz (94.53 kg)  12/18/13 209 lb 1.6 oz (94.847 kg)  11/11/13 207 lb 11.2 oz (94.212 kg)  11/05/13 209 lb (94.802 kg)  11/03/13 206 lb 9.6 oz (93.713 kg)  10/28/13 205 lb 6.4 oz (93.169 kg)  10/21/13 203 lb 14.4 oz (92.488 kg)  10/14/13 203 lb 12.8 oz (92.443 kg)    Usual Body Weight: 202-203  % Usual Body Weight: 98%  BMI:  Body mass index is 29.37 kg/(m^2). Overweight  Estimated Nutritional Needs: Kcal: 1900-2100 kcal Protein: 100-110 gram Fluid: >/=1900 ml/daily  Skin: WDL  Diet Order: Clear Liquid  EDUCATION NEEDS: -No education needs identified at this time   Intake/Output Summary (Last 24 hours) at 01/13/14 1449 Last data filed at 01/13/14 0700  Gross per 24 hour  Intake  617.5 ml  Output    800 ml  Net -182.5 ml    Last BM: 2/10   Labs:   Recent Labs Lab 01/08/14 1000 01/12/14 1345 01/13/14 0345  NA 135* 135* 129*  K 4.5 4.4 3.5*  CL  --  94* 92*  CO2 25 24 23   BUN 18.6 10 8   CREATININE 1.1 0.89 0.78  CALCIUM 10.1 9.1 8.0*  GLUCOSE 224* 223* 224*    CBG (last 3)   Recent Labs  01/12/14 2222 01/13/14 0736 01/13/14 1128  GLUCAP 217* 197* 211*  Scheduled Meds: . amLODipine  5 mg Oral Daily   And  . valsartan  160 mg Oral Daily  . insulin aspart  0-15 Units Subcutaneous TID WC  . levofloxacin (LEVAQUIN) IV  750 mg Intravenous Q24H  . linagliptin  5 mg Oral Daily  . metoprolol succinate  25 mg Oral QHS  . mupirocin ointment   Nasal TID  . oxymetazoline  3 spray Each Nare TID  . pantoprazole  40 mg Oral Daily  . simvastatin  10 mg Oral q1800    Continuous Infusions: . sodium chloride 75  mL/hr at 01/12/14 2246    Past Medical History  Diagnosis Date  . Coronary artery disease     s/p PCI 2000 and single vessel CABG 2001  . Hypercholesterolemia   . GERD (gastroesophageal reflux disease)   . Tubular adenoma 2003  . COPD (chronic obstructive pulmonary disease)   . Diabetes mellitus   . Arthritis   . Allergic rhinitis   . Cancer     lung mass  . Hypertension     Dr Burt Knack  . History of radiation therapy 09/23/2013-11/12/2013    63 gray to right central chest    Past Surgical History  Procedure Laterality Date  . Left rotator cuff repair    . Left knee arthroscopy   x2    for meniscal tear  . Coronary stent placement  2001  . Right knee arthroscopy    . Bilateral carpal tunnel release    . Right shoulder arthroscopy    . Coronary artery bypass graft      occlusion of the stent and underwent one-vessel CABG in 2002  . Coronary angioplasty    . Colonoscopy  09/13/2011    Procedure: COLONOSCOPY;  Surgeon: Rogene Houston, MD;  Location: [facility];  Service: Endoscopy;  Laterality: N/A;  . Cataract extraction w/phaco  09/25/2011    Procedure: CATARACT EXTRACTION PHACO AND INTRAOCULAR LENS PLACEMENT (IOC);  Surgeon: Tonny Branch;  Location: [facility];  Service: Ophthalmology;  Laterality: Left;  CDE:10.79  . Cataract extraction w/phaco  10/05/2011    Procedure: CATARACT EXTRACTION PHACO AND INTRAOCULAR LENS PLACEMENT (IOC);  Surgeon: Tonny Branch;  Location: [facility];  Service: Ophthalmology;  Laterality: Right;  CDE: 8.45  . Video bronchoscopy with endobronchial ultrasound N/A 09/08/2013    Procedure: VIDEO BRONCHOSCOPY WITH ENDOBRONCHIAL ULTRASOUND;  Surgeon: Grace Isaac, MD;  Location: [place];  Service: Thoracic;  Laterality: N/A;    Atlee Abide MS RD LDN Clinical Dietitian OLIDC:[phone]

## 2014-01-13 NOTE — Progress Notes (Signed)
Note: This document was prepared with digital dictation and possible smart phrase technology. Any transcriptional errors that result from this process are unintentional.   David [name] ZOX:[id number] DOB: 1941-01-20 DOA: 01/12/2014 PCP: [name], MD  Brief narrative: 73 year old ?, stage IIIa non-small cell lung cancer received radiation therapy and chemotherapy about 6 weeks back, (currently started on consolidation chemotherapy 3 weeks back), hypertension, diabetes mellitus, CAD, hyperlipidemia presented with increased cough with hemoptysis since 2/9 associated with increased shortness of breath  CT angiogram = right hilar mass, patchy groundglass opacity? Pulmonary hemorrhage as well as fibrosis Pulmonology consulted at the request of oncologist Patient tells me these issues started 2 weeks ago with nasal irritation and started having spontaneous bleeding from the nose. He then states he started coughing mainly once every morning small amount of blood which worsened acutely from 2/6-2/10 He states that he coughed this morning really badly with use of inhalers and had further hemoptysis  Past medical history-As per Problem list Chart reviewed as below-   Consultants:  Pulm  ENT  Procedures:  CTangio  Antibiotics:  None currently   Subjective  Coughing small amounts of blood Nursing reports one loose stool.  Asking for loperamide Denies CP     Objective    Interim History: none  Telemetry: Sinus tach ~116   Objective: Filed Vitals:   01/12/14 2011 01/12/14 2205 01/13/14 0240 01/13/14 0500  BP: 140/72   132/70  Pulse: 113   109  Temp: 98.2 F (36.8 C)   98.3 F (36.8 C)  TempSrc: Oral   Oral  Resp: 22   22  Height: 5\' 9"  (1.753 m)     Weight: 90.266 kg (199 lb)     SpO2: 98% 98% 97% 99%    Intake/Output Summary (Last 24 hours) at 01/13/14 0846 Last data filed at 01/13/14 0700  Gross per 24 hour  Intake  617.5 ml  Output    800 ml  Net  -182.5 ml    Exam:  General: EOmi, comfortable Cardiovascular: s1 s2 tach Respiratory: clear, no TVr, TVF Abdomen: soft, NT, ND Skin no LE edema Neuro intact  Data Reviewed: Basic Metabolic Panel:  Recent Labs Lab 01/08/14 1000 01/12/14 1345 01/13/14 0345  NA 135* 135* 129*  K 4.5 4.4 3.5*  CL  --  94* 92*  CO2 25 24 23   GLUCOSE 224* 223* 224*  BUN 18.6 10 8   CREATININE 1.1 0.89 0.78  CALCIUM 10.1 9.1 8.0*   Liver Function Tests:  Recent Labs Lab 01/08/14 1000  AST 13  ALT 14  ALKPHOS 115  BILITOT 0.52  PROT 7.1  ALBUMIN 3.1*   No results found for this basename: LIPASE, AMYLASE,  in the last 168 hours No results found for this basename: AMMONIA,  in the last 168 hours CBC:  Recent Labs Lab 01/08/14 1000 01/12/14 1345 01/13/14 0345  WBC 7.3 11.1*  --   NEUTROABS 6.1  --   --   HGB 13.1 12.9* 11.0*  HCT 38.8 38.3* 31.9*  MCV 95.7 93.9  --   PLT 207 240  --    Cardiac Enzymes: No results found for this basename: CKTOTAL, CKMB, CKMBINDEX, TROPONINI,  in the last 168 hours BNP: No components found with this basename: POCBNP,  CBG:  Recent Labs Lab 01/12/14 2222 01/13/14 0736  GLUCAP 217* 197*    No results found for this or any previous visit (from the past 240 hour(s)).   Studies:  All Imaging reviewed and is as per above notation   Scheduled Meds: . albuterol  2.5 mg Nebulization Q6H  . amLODipine  5 mg Oral Daily   And  . valsartan  160 mg Oral Daily  . insulin aspart  0-15 Units Subcutaneous TID WC  . levofloxacin (LEVAQUIN) IV  750 mg Intravenous Q24H  . linagliptin  5 mg Oral Daily  . metoprolol succinate  25 mg Oral QHS  . pantoprazole  40 mg Oral Daily  . simvastatin  10 mg Oral q1800   Continuous Infusions: . sodium chloride 75 mL/hr at 01/12/14 2246     Assessment/Plan:  1. Acute hypoxic resp failure-Keep on O2. ambualtory sats later today if able.  2/2 to #2 + #5 2. Hemoptysis vs Nasal bleed-Defer to  Pulm/ENT.  appreciate input-defer to pulm if can d/c Levaquin--afebrile currently, no WBC. Might need Abx if prominent nasal bleed noted per ENT 3. Diarrhea-Unclear etiology-hold anti-motility agents until can visualise character stool.  Low threshold to gauic/Cdiff PCR patient 4. Stage III Lung Ca-Oncology appreciated.  Has had XRT [potential for Rad induced lung injury?] 5. Pulm Fibrosis-see above 6. Mild-mod COPD per OV 07/30/06-Albuterol worsened hemoptysis-pt may hold this if he doesn't like it 7. CAD, s/p CABG 2002-Antiplt/Anticoag on hold for now given 1.  Cont Toprol 25 qhs 8. Htn-Cont Amlodipine 5 daily, Valsartan 160 daily 9. H/o adenomatous polpy 2012-stable 10. Gerd-On protonix-see #2-might have to d/c the same 11. Hld-cont zocor 40 daily   SCD  Code Status: Full Family Communication: none present Disposition Plan: Per Pulm and Work-up   Verneita Griffes, MD  [facility] Pager [phone] 01/13/2014, 8:46 AM    LOS: 1 day

## 2014-01-13 NOTE — Consult Note (Signed)
PMH:  Past Medical History  Diagnosis Date  . Coronary artery disease     s/p PCI 2000 and single vessel CABG 2001  . Hypercholesterolemia   . GERD (gastroesophageal reflux disease)   . Tubular adenoma 2003  . COPD (chronic obstructive pulmonary disease)   . Diabetes mellitus   . Arthritis   . Allergic rhinitis   . Cancer     lung mass  . Hypertension     Dr Burt Knack  . History of radiation therapy 09/23/2013-11/12/2013    63 gray to right central chest    PSH:  Past Surgical History  Procedure Laterality Date  . Left rotator cuff repair    . Left knee arthroscopy   x2    for meniscal tear  . Coronary stent placement  2001  . Right knee arthroscopy    . Bilateral carpal tunnel release    . Right shoulder arthroscopy    . Coronary artery bypass graft      occlusion of the stent and underwent one-vessel CABG in 2002  . Coronary angioplasty    . Colonoscopy  09/13/2011    Procedure: COLONOSCOPY;  Surgeon: Rogene Houston, MD;  [facility];  Service: Endoscopy;  Laterality: N/A;  . Cataract extraction w/phaco  09/25/2011    Procedure: CATARACT EXTRACTION PHACO AND INTRAOCULAR LENS PLACEMENT (IOC);  Surgeon: Tonny Branch;  Location: [facility];  Service: Ophthalmology;  Laterality: Left;  CDE:10.79  . Cataract extraction w/phaco  10/05/2011    Procedure: CATARACT EXTRACTION PHACO AND INTRAOCULAR LENS PLACEMENT (IOC);  Surgeon: Tonny Branch;  Location: [facility];  Service: Ophthalmology;  Laterality: Right;  CDE: 8.45  . Video bronchoscopy with endobronchial ultrasound N/A 09/08/2013    Procedure: VIDEO BRONCHOSCOPY WITH ENDOBRONCHIAL ULTRASOUND;  Surgeon: Grace Isaac, MD;  Location: [facility];  Service: Thoracic;  Laterality: N/A;      David [name], David [name] [id number] 08-May-1941 Nita Sells, MD  Reason for Consult: Epistaxis/hemoptysis  HPI: patient with lung cancer recently treated admitted for hemoptysis. States he was blowing his nose a few weeks ago and would  have some blood on the tissue, more recently hs been coughing up blood clots.  Allergies: No Known Allergies  ROS: epistaxis/hemoptysis, cough, otherwise negative x 12 systems except per HPI PMH:  Past Medical History  Diagnosis Date  . Coronary artery disease     s/p PCI 2000 and single vessel CABG 2001  . Hypercholesterolemia   . GERD (gastroesophageal reflux disease)   . Tubular adenoma 2003  . COPD (chronic obstructive pulmonary disease)   . Diabetes mellitus   . Arthritis   . Allergic rhinitis   . Cancer     lung mass  . Hypertension     Dr Burt Knack  . History of radiation therapy 09/23/2013-11/12/2013    63 gray to right central chest    FH:  Family History  Problem Relation Age of Onset  . Heart failure Mother   . Cancer Father     lung canger  . Cancer Brother     lung cancer  . Anesthesia problems Neg Hx   . Hypotension Neg Hx   . Malignant hyperthermia Neg Hx   . Pseudochol deficiency Neg Hx     SH:  History   Social History  . Marital Status: Married    Spouse Name: N/A    Number of Children: N/A  . Years of Education: N/A   Occupational History  . Not on file.  Social History Main Topics  . Smoking status: Former Smoker -- 1.50 packs/day for 40 years    Types: Cigarettes    Quit date: 09/19/1995  . Smokeless tobacco: Not on file  . Alcohol Use: 7.0 oz/week    14 drink(s) per week     Comment: scotch 1 shot daily  . Drug Use: No  . Sexual Activity: Not on file   Other Topics Concern  . Not on file   Social History Narrative  . No narrative on file    PSH:  Past Surgical History  Procedure Laterality Date  . Left rotator cuff repair    . Left knee arthroscopy   x2    for meniscal tear  . Coronary stent placement  2001  . Right knee arthroscopy    . Bilateral carpal tunnel release    . Right shoulder arthroscopy    . Coronary artery bypass graft      occlusion of the stent and underwent one-vessel CABG in 2002  . Coronary  angioplasty    . Colonoscopy  09/13/2011    Procedure: COLONOSCOPY;  Surgeon: Rogene Houston, MD;  [facility];  Service: Endoscopy;  Laterality: N/A;  . Cataract extraction w/phaco  09/25/2011    Procedure: CATARACT EXTRACTION PHACO AND INTRAOCULAR LENS PLACEMENT (IOC);  Surgeon: Tonny Branch;  Location: [facility];  Service: Ophthalmology;  Laterality: Left;  CDE:10.79  . Cataract extraction w/phaco  10/05/2011    Procedure: CATARACT EXTRACTION PHACO AND INTRAOCULAR LENS PLACEMENT (IOC);  Surgeon: Tonny Branch;  Location: [facility];  Service: Ophthalmology;  Laterality: Right;  CDE: 8.45  . Video bronchoscopy with endobronchial ultrasound N/A 09/08/2013    Procedure: VIDEO BRONCHOSCOPY WITH ENDOBRONCHIAL ULTRASOUND;  Surgeon: Grace Isaac, MD;  Location: [facility];  Service: Thoracic;  Laterality: N/A;    Physical  Exam: CN 2-12 grossly intact and symmetric. EAC/TMs normal BL. Oral cavity, lips, gums, ororpharynx normal with no masses or lesions. Skin warm and dry. See nasal endoscopy.  External nose and ears without masses or lesions. EOMI, PERRLA. Neck supple.  Procedure Note:  31231 diagnostic nasal endoscopy.  Informed verbal consent was obtained after explaining the risks (including bleeding and infection), benefits and alternatives of the procedure. Verbal timeout was performed prior to the procedure. The nose was topicalized with topical oxymetazoline. The 50mm flexible scope was advanced through the  nasal cavity bilaterally. On the left there is copious nasal crusting filling the anterior left nasal cavity, but no clots or bleeding is seen and no old blood is seen. There was some mild left anterior septal dryness/excoriation that I cauterized with silver nitrate. No masses or lesions were seen. On the right there is also crusting on the inferior turbinate and similar to the left the nasal mucosa is extremely dry but no old blood, clots, or bleeding is seen and the turbinates and septum are  grossly normal with no masses or lesions. The eustachian tube, choana, and adenoid pad  were normal in appearance. No bleeding or clots were seen.  The hypopharynx, arytenoids, false vocal folds, and true vocal folds appeared normal with normal adduction and abduction bilaterally. The visualized portion of the subglottis appeared normal. The patient tolerated the procedure with no immediate complications. I dressed the nasal cavity with neosporin ointment bilaterally.  A/P: no evidence for epistaxis, recent bleeding, or clots in the nasal cavity or pharynx/larynx with essentially normal nasopharyngolaryngoscopy other than very dry nasal mucosa and crusting. I ordered Afrin TID  x 5 days, and TID mupirocin ointment to help moisten the nasal mucosa. I would recommend the patient avoid blowing his nose and if he needs to be on oxygen this should be humidified. No evidence of a source of his hemoptysis in the nose or larynx.   [name] 01/13/2014 5:56 PM

## 2014-01-13 NOTE — Consult Note (Signed)
PULMONARY  / CRITICAL CARE MEDICINE  Name: David [name] MRN: [id number] DOB: 01-18-1941 PCP David Grills, MD    ADMISSION DATE:  01/12/2014  LOS 1 days   CONSULTATION DATE:  01/13/2014   REFERRING MD :  David David [name] PRIMARY SERVICE: TRH  CHIEF COMPLAINT:  Hemoptysis v epistaxis  BRIEF PATIENT DESCRIPTION: Hemoptysius v epistaxis in stage 3A lung cancer patient  SIGNIFICANT EVENTS / STUDIES:  01/12/2014 - admit   LINES / TUBES: none  CULTURES: Results for orders placed during the hospital encounter of 09/08/13  AFB CULTURE WITH SMEAR     Status: None   Collection Time    09/08/13  8:16 AM      Result Value Range Status   Specimen Description BRONCHIAL WASHINGS   Final   Special Requests PT ON ANCEF   Final   ACID FAST SMEAR     Final   Value: NO ACID FAST BACILLI SEEN     Performed at [facility]   Culture     Final   Value: NO ACID FAST BACILLI ISOLATED IN 6 WEEKS     Performed at [facility]   Report Status 10/21/2013 FINAL   Final  CULTURE, RESPIRATORY (NON-EXPECTORATED)     Status: None   Collection Time    09/08/13  8:16 AM      Result Value Range Status   Specimen Description BRONCHIAL WASHINGS   Final   Special Requests PT ON ANCEF   Final   Gram Stain     Final   Value: MODERATE WBC PRESENT,BOTH PMN AND MONONUCLEAR     NO SQUAMOUS EPITHELIAL CELLS SEEN     NO ORGANISMS SEEN     Performed at [facility]   Culture     Final   Value: NO GROWTH 2 DAYS     Performed at [facility]   Report Status 09/10/2013 FINAL   Final  FUNGUS CULTURE W SMEAR     Status: None   Collection Time    09/08/13  8:16 AM      Result Value Range Status   Specimen Description BRONCHIAL WASHINGS   Final   Special Requests PT ON ANCEF   Final   Fungal Smear     Final   Value: NO YEAST OR FUNGAL ELEMENTS SEEN     Performed at [facility]   Culture     Final   Value: No Fungi Isolated in 4 Weeks     Performed at  [facility]   Report Status 10/06/2013 FINAL   Final     ANTIBIOTICS: Anti-infectives   Start     Dose/Rate Route Frequency Ordered Stop   01/13/14 1600  levofloxacin (LEVAQUIN) IVPB 750 mg     750 mg 100 mL/hr over 90 Minutes Intravenous Every 24 hours 01/12/14 2037     01/12/14 1500  levofloxacin (LEVAQUIN) IVPB 750 mg     750 mg 100 mL/hr over 90 Minutes Intravenous  Once 01/12/14 1459 01/12/14 1648   01/12/14 0000  levofloxacin (LEVAQUIN) 500 MG tablet     500 mg Oral Daily 01/12/14 1551         HISTORY OF PRESENT ILLNESS:  73 year old gentleman with COPD and otherwise specified and undiagnosed UIP on CT chest with stage IIIA non-small cell lung cancer the care of David. Julien [name]. Status post radiation therapy to right chest October 2014 through December 2014 and ongoing chemotherapy  Of note, 09/08/2013 he  underwent bronchoscopy with endobronchial ultrasound for the diagnosis of non-small cell lung cancer. It was noted that he had a R3 segmental bronchus and with endobronchial lesion. He biopsy of the station 7 and 4 or lymph nodes. It must be noted that the endobronchial biopsyR3  endobronchial lesion was nondiagnostic and it was EBUS  produced positive results   A few weeks ago started having epistaxis due to the cold weather but a few days later this progress to his chest and he started having hemoptysis but no further epistaxis. However, he is uncertain if this is epistaxis that drains into his chest or this is primary hemoptysis. Hemoptysis is daily and has progressively gotten worse. He only coughs one or 2 times a day and each time it's a big glob of blood. Certainly it is not massive or submassive hemoptysis . He otherwise feels well without any sputum production, fever, edema, chest pain, weight loss or associated other symptoms. Hemoptysis is not associated with anti-coagulation  . CT hest at admission so stable right hilar mass comapred to earlier in mid-jan 2015  PAST  MEDICAL HISTORY :  Past Medical History  Diagnosis Date  . Coronary artery disease     s/p PCI 2000 and single vessel CABG 2001  . Hypercholesterolemia   . GERD (gastroesophageal reflux disease)   . Tubular adenoma 2003  . COPD (chronic obstructive pulmonary disease)   . Diabetes mellitus   . Arthritis   . Allergic rhinitis   . Cancer     lung mass  . Hypertension     David [name]  . History of radiation therapy 09/23/2013-11/12/2013    63 gray to right central chest     Family History  Problem Relation Age of Onset  . Heart failure Mother   . Cancer Father     lung canger  . Cancer Brother     lung cancer  . Anesthesia problems Neg Hx   . Hypotension Neg Hx   . Malignant hyperthermia Neg Hx   . Pseudochol deficiency Neg Hx      History   Social History  . Marital Status: Married    Spouse Name: N/A    Number of Children: N/A  . Years of Education: N/A   Occupational History  . Not on file.   Social History Main Topics  . Smoking status: Former Smoker -- 1.50 packs/day for 40 years    Types: Cigarettes    Quit date: 09/19/1995  . Smokeless tobacco: Not on file  . Alcohol Use: 7.0 oz/week    14 drink(s) per week     Comment: scotch 1 shot daily  . Drug Use: No  . Sexual Activity: Not on file   Other Topics Concern  . Not on file   Social History Narrative  . No narrative on file     No Known Allergies    (Not in an outpatient encounter)     REVIEW OF SYSTEMS:  Per HPI. Rest 11 point ROS was negative  SUBJECTIVE:   VITAL SIGNS: Filed Vitals:   01/12/14 2011 01/12/14 2205 01/13/14 0240 01/13/14 0500  BP: 140/72   132/70  Pulse: 113   109  Temp: 98.2 F (36.8 C)   98.3 F (36.8 C)  TempSrc: Oral   Oral  Resp: 22   22  Height: 5\' 9"  (1.753 m)     Weight: 199 lb (90.266 kg)     SpO2: 98% 98% 97% 99%  HEMODYNAMICS:   VENTILATOR SETTINGS: Vent Mode:  [-] PRVC FiO2 (%):  [30 %] 30 % Set Rate:  [28 bmp] 28 bmp Vt Set:  [420 mL]  420 mL PEEP:  [5 cmH20] 5 cmH20 Plateau Pressure:  [4 CLE75-17 cmH20] 20 cmH20 INTAKE / OUTPUT: I/O last 3 completed shifts: In: 617.5 [I.V.:617.5] Out: 800 [Urine:800]     PHYSICAL EXAMINATION: General:  Alert and oriented x3. Obese, looks well Neuro:  Alert and oriented x3. Glasgow Coma Scale 15. CAM-ICU negative for delirium. Moves all fours. HEENT:  Mallampati class II. He is irritated in both nostrils medial aspect in the Little's area suggesting of epistaxis Cardiovascular:  S1-S2 present. No murmurs Lungs:  Mild barrel chest. Overall air entry diminished . Mild crackles in the base. Abdomen:  Soft nontender no organomegaly Musculoskeletal:  No cyanosis, no clubbing no edema Skin:  Intact  LABS: PULMONARY No results found for this basename: PHART, PCO2, PCO2ART, PO2, PO2ART, HCO3, TCO2, O2SAT,  in the last 168 hours  CBC  Recent Labs Lab 01/08/14 1000 01/12/14 1345 01/13/14 0345  HGB 13.1 12.9* 11.0*  HCT 38.8 38.3* 31.9*  WBC 7.3 11.1*  --   PLT 207 240  --     COAGULATION No results found for this basename: INR,  in the last 168 hours  CARDIAC  No results found for this basename: TROPONINI,  in the last 168 hours  Recent Labs Lab 01/12/14 1345  PROBNP 204.4*     CHEMISTRY  Recent Labs Lab 01/08/14 1000 01/12/14 1345 01/13/14 0345  NA 135* 135* 129*  K 4.5 4.4 3.5*  CL  --  94* 92*  CO2 25 24 23   GLUCOSE 224* 223* 224*  BUN 18.6 10 8   CREATININE 1.1 0.89 0.78  CALCIUM 10.1 9.1 8.0*   Estimated Creatinine Clearance: 92.7 ml/min (by C-G formula based on Cr of 0.78).   LIVER  Recent Labs Lab 01/08/14 1000  AST 13  ALT 14  ALKPHOS 115  BILITOT 0.52  PROT 7.1  ALBUMIN 3.1*     INFECTIOUS No results found for this basename: LATICACIDVEN, PROCALCITON,  in the last 168 hours   ENDOCRINE CBG (last 3)   Recent Labs  01/12/14 2222 01/13/14 0736  GLUCAP 217* 197*         IMAGING x48h  Ct Angio Chest Pe W/cm &/or Wo  Cm  01/12/2014   CLINICAL DATA:  Shortness of breath and hemoptysis. Hypoxia. Lung cancer. Received radiation therapy within last 3 months.  EXAM: CT ANGIOGRAPHY CHEST WITH CONTRAST  TECHNIQUE: Multidetector CT imaging of the chest was performed using the standard protocol during bolus administration of intravenous contrast. Multiplanar CT image reconstructions and MIPs were obtained to evaluate the vascular anatomy.  CONTRAST:  166mL OMNIPAQUE IOHEXOL 350 MG/ML SOLN  COMPARISON:  CT CHEST W/CM dated 12/19/2013  FINDINGS: Respiratory motion somewhat degrades image quality. No definite pulmonary embolus. Right hilar adenopathy measures 2.5 x 3.1 cm, likely stable from 12/19/2013. Mediastinal lymph nodes are not enlarged by CT size criteria. No axillary adenopathy. Heart is at the upper limits of normal in size. No pericardial effusion.  Mild centrilobular emphysema. There is a basilar distribution of subpleural reticulation, ground-glass, architectural distortion, and traction bronchiectasis/bronchiolectasis. Septal thickening appears increased from 12/19/2013. There are areas of patchy central parenchymal involvement, right greater than left. No pleural fluid. Narrowing of right upper lobe bronchi. Airway is otherwise unremarkable.  Incidental imaging of the upper at shows a sub cm low-attenuation lesion in  the left hepatic lobe, as before. Visualized portions of the liver, adrenal glands, spleen and stomach are otherwise unremarkable, with exception of a tiny hiatal hernia. No worrisome lytic or sclerotic lesions. Degenerative changes are seen throughout spine. Remote median sternotomy.  Review of the MIP images confirms the above findings.  IMPRESSION: 1. Image quality is somewhat degraded by respiratory motion. No definite pulmonary embolus. 2. Right hilar mass is likely stable from 12/19/2013. 3. Patchy ground-glass and septal thickening may be due to pulmonary edema and/or pulmonary hemorrhage. 4. 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   Electronically Signed   By: Lorin Picket M.D.   On: 01/12/2014 18:04   Dg Chest Port 1 View  01/12/2014   CLINICAL DATA:  Shortness of breath and weakness  EXAM: PORTABLE CHEST - 1 VIEW  COMPARISON:  12/19/2013  FINDINGS: Cardiac shadow is stable. Postoperative changes are noted. Changes are noted in the right mid and lower lung similar to that seen on the prior exam however there is a superimposed component of increased opacification. This may represent acute on chronic infiltrate. Correlation with the clinical exam is recommended. The left lung remains clear. No bony abnormality is seen.  IMPRESSION: Likely acute on chronic infiltrate.   Electronically Signed   By: Inez Catalina M.D.   On: 01/12/2014 14:07       ASSESSMENT / PLAN:  PULMONARY A:Hemoptysis V Epistaxis: 50% probability for either based on history and exam P:   Rec ENT consult Will plan bronch for airway exam 11.30am 01/14/14 at [facility]; airway exam with focus on Rt sided endobronchial lesion in Right Upper Lobe NPO after 3am 01/14/14 Wife updated in detail No heparin or lovenox or plavix till sorted out; SCDs for dvt prophylaxs  D/w David David [name] of [facility]      David. Brand Males, M.D., [facility].C.P Pulmonary and Critical Care Medicine Staff Physician [place] [facility] Pager: [phone], If no answer or between  15:00h - 7:00h: call [phone]  01/13/2014 9:57 AM

## 2014-01-14 ENCOUNTER — Encounter (HOSPITAL_COMMUNITY)
Admission: EM | Disposition: A | Discharge status: Home / Self Care | Payer: Self-pay | Source: Home / Self Care | Attending: Internal Medicine

## 2014-01-14 ENCOUNTER — Encounter (HOSPITAL_COMMUNITY): Payer: Self-pay | Admitting: Respiratory Therapy

## 2014-01-14 ENCOUNTER — Inpatient Hospital Stay (HOSPITAL_COMMUNITY): Payer: Medicare HMO

## 2014-01-14 HISTORY — PX: VIDEO BRONCHOSCOPY: SHX5072

## 2014-01-14 LAB — HEMOGLOBIN AND HEMATOCRIT, BLOOD
HCT: 34.1 % — ABNORMAL LOW (ref 39.0–52.0)
HEMATOCRIT: 32.1 % — AB (ref 39.0–52.0)
HEMATOCRIT: 32.3 % — AB (ref 39.0–52.0)
HEMOGLOBIN: 11.4 g/dL — AB (ref 13.0–17.0)
Hemoglobin: 10.9 g/dL — ABNORMAL LOW (ref 13.0–17.0)
Hemoglobin: 11.3 g/dL — ABNORMAL LOW (ref 13.0–17.0)

## 2014-01-14 LAB — GLUCOSE, CAPILLARY
GLUCOSE-CAPILLARY: 125 mg/dL — AB (ref 70–99)
GLUCOSE-CAPILLARY: 181 mg/dL — AB (ref 70–99)
Glucose-Capillary: 101 mg/dL — ABNORMAL HIGH (ref 70–99)
Glucose-Capillary: 179 mg/dL — ABNORMAL HIGH (ref 70–99)

## 2014-01-14 SURGERY — VIDEO BRONCHOSCOPY WITHOUT FLUORO
Anesthesia: Moderate Sedation | Laterality: Bilateral

## 2014-01-14 MED ORDER — BUTAMBEN-TETRACAINE-BENZOCAINE 2-2-14 % EX AERO: 1.0000 | INHALATION_SPRAY | Freq: Once | CUTANEOUS | Status: DC

## 2014-01-14 MED ORDER — MIDAZOLAM HCL 10 MG/2ML IJ SOLN
INTRAMUSCULAR | Status: AC
  Filled 2014-01-14: qty 4

## 2014-01-14 MED ORDER — LIDOCAINE HCL 2 % EX GEL: Freq: Once | CUTANEOUS | Status: DC

## 2014-01-14 MED ORDER — LIDOCAINE HCL 2 % EX GEL
CUTANEOUS | Status: DC | PRN
  Administered 2014-01-14: 2

## 2014-01-14 MED ORDER — PHENYLEPHRINE HCL 0.25 % NA SOLN
1.0000 | Freq: Four times a day (QID) | NASAL | Status: DC | PRN
Start: 2014-01-14 — End: 2014-01-14

## 2014-01-14 MED ORDER — MIDAZOLAM HCL 10 MG/2ML IJ SOLN
INTRAMUSCULAR | Status: DC | PRN
  Administered 2014-01-14 (×2): 1 mg via INTRAVENOUS

## 2014-01-14 MED ORDER — FENTANYL CITRATE 0.05 MG/ML IJ SOLN
INTRAMUSCULAR | Status: AC
  Filled 2014-01-14: qty 4

## 2014-01-14 MED ORDER — FENTANYL CITRATE 0.05 MG/ML IJ SOLN
INTRAMUSCULAR | Status: DC | PRN
  Administered 2014-01-14 (×2): 25 ug via INTRAVENOUS

## 2014-01-14 MED ORDER — PHENYLEPHRINE HCL 0.5 % NA SOLN
NASAL | Status: DC | PRN
  Administered 2014-01-14: 2 [drp] via NASAL

## 2014-01-14 MED ORDER — PHENYLEPHRINE HCL 0.25 % NA SOLN
NASAL | Status: DC | PRN
  Administered 2014-01-14: 2 via NASAL

## 2014-01-14 MED ORDER — LIDOCAINE HCL 1 % IJ SOLN
INTRAMUSCULAR | Status: DC | PRN
  Administered 2014-01-14: 2 mg

## 2014-01-14 NOTE — Interval H&P Note (Signed)
Risks of pneumothorax, hemothorax, sedation/anesthesia complications such as cardiac or respiratory arrest or hypotension, stroke and bleeding all explained. Benefits of diagnosis but limitations of non-diagnosis also explained. Patient verbalized understanding and wished to proceed.    Has ongoing hemoptysis. ENT findings noted. No other changes overnight  PULMONARY No results found for this basename: PHART, PCO2, PCO2ART, PO2, PO2ART, HCO3, TCO2, O2SAT,  in the last 168 hours  CBC  Recent Labs Lab 01/08/14 1000 01/12/14 1345  01/13/14 1208 01/13/14 1959 01/14/14 0350  HGB 13.1 12.9*  < > 12.0* 11.6* 10.9*  HCT 38.8 38.3*  < > 35.0* 33.6* 32.1*  WBC 7.3 11.1*  --   --   --   --   PLT 207 240  --   --   --   --   < > = values in this interval not displayed.  COAGULATION No results found for this basename: INR,  in the last 168 hours  CARDIAC  No results found for this basename: TROPONINI,  in the last 168 hours  Recent Labs Lab 01/12/14 1345  PROBNP 204.4*     CHEMISTRY  Recent Labs Lab 01/08/14 1000 01/12/14 1345 01/13/14 0345  NA 135* 135* 129*  K 4.5 4.4 3.5*  CL  --  94* 92*  CO2 25 24 23   GLUCOSE 224* 223* 224*  BUN 18.6 10 8   CREATININE 1.1 0.89 0.78  CALCIUM 10.1 9.1 8.0*   Estimated Creatinine Clearance: 92.6 ml/min (by C-G formula based on Cr of 0.78).   LIVER  Recent Labs Lab 01/08/14 1000  AST 13  ALT 14  ALKPHOS 115  BILITOT 0.52  PROT 7.1  ALBUMIN 3.1*     INFECTIOUS No results found for this basename: LATICACIDVEN, PROCALCITON,  in the last 168 hours   ENDOCRINE CBG (last 3)   Recent Labs  01/13/14 1658 01/13/14 2251 01/14/14 0747  GLUCAP 244* 118* 125*         IMAGING x48h  Ct Angio Chest Pe W/cm &/or Wo Cm  01/12/2014   CLINICAL DATA:  Shortness of breath and hemoptysis. Hypoxia. Lung cancer. Received radiation therapy within last 3 months.  EXAM: CT ANGIOGRAPHY CHEST WITH CONTRAST  TECHNIQUE: Multidetector CT  imaging of the chest was performed using the standard protocol during bolus administration of intravenous contrast. Multiplanar CT image reconstructions and MIPs were obtained to evaluate the vascular anatomy.  CONTRAST:  137mL OMNIPAQUE IOHEXOL 350 MG/ML SOLN  COMPARISON:  CT CHEST W/CM dated 12/19/2013  FINDINGS: Respiratory motion somewhat degrades image quality. No definite pulmonary embolus. Right hilar adenopathy measures 2.5 x 3.1 cm, likely stable from 12/19/2013. Mediastinal lymph nodes are not enlarged by CT size criteria. No axillary adenopathy. Heart is at the upper limits of normal in size. No pericardial effusion.  Mild centrilobular emphysema. There is a basilar distribution of subpleural reticulation, ground-glass, architectural distortion, and traction bronchiectasis/bronchiolectasis. Septal thickening appears increased from 12/19/2013. There are areas of patchy central parenchymal involvement, right greater than left. No pleural fluid. Narrowing of right upper lobe bronchi. Airway is otherwise unremarkable.  Incidental imaging of the upper at shows a sub cm low-attenuation lesion in the left hepatic lobe, as before. Visualized portions of the liver, adrenal glands, spleen and stomach are otherwise unremarkable, with exception of a tiny hiatal hernia. No worrisome lytic or sclerotic lesions. Degenerative changes are seen throughout spine. Remote median sternotomy.  Review of the MIP images confirms the above findings.  IMPRESSION: 1. Image quality is somewhat degraded  by respiratory motion. No definite pulmonary embolus. 2. Right hilar mass is likely stable from 12/19/2013. 3. Patchy ground-glass and septal thickening may be due to pulmonary edema and/or pulmonary hemorrhage. 4. 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   Electronically Signed   By: Lorin Picket M.D.   On:  01/12/2014 18:04   Dg Chest Port 1 View  01/12/2014   CLINICAL DATA:  Shortness of breath and weakness  EXAM: PORTABLE CHEST - 1 VIEW  COMPARISON:  12/19/2013  FINDINGS: Cardiac shadow is stable. Postoperative changes are noted. Changes are noted in the right mid and lower lung similar to that seen on the prior exam however there is a superimposed component of increased opacification. This may represent acute on chronic infiltrate. Correlation with the clinical exam is recommended. The left lung remains clear. No bony abnormality is seen.  IMPRESSION: Likely acute on chronic infiltrate.   Electronically Signed   By: Inez Catalina M.D.   On: 01/12/2014 14:07

## 2014-01-14 NOTE — Progress Notes (Signed)
Dr. Brand Males, M.D., [facility].C.P Pulmonary and Critical Care Medicine Staff Physician [place] [facility] Pager: [phone], If no answer or between  15:00h - 7:00h: call [phone]  01/14/2014 12:32 PM

## 2014-01-14 NOTE — Progress Notes (Signed)
Video Bronchoscopy good patient tolerance

## 2014-01-14 NOTE — Progress Notes (Signed)
PROGRESS NOTE  David [name] VPX:[id number] DOB: [birth year] DOA: 01/12/2014 PCP: [name], MD  Assessment/Plan:  1. Acute hypoxic resp failure- stable 2. Hemoptysis - s/p bronch today, considering [facility] transfer per Pulm.  3. Diarrhea-Unclear etiology-hold anti-motility agents until can visualise character stool. C diff negative. 4. Stage III Lung Ca-Oncology appreciated. Has had XRT [potential for Rad induced lung injury?] 5. Pulm Fibrosis-see above 6. Mild-mod COPD per OV 07/30/06-Albuterol worsened hemoptysis-pt may hold this if he doesn't like it 7. CAD, s/p CABG 2002-Antiplt/Anticoag on hold for now given 1. Cont Toprol 25 qhs 8. Htn-Cont Amlodipine 5 daily, Valsartan 160 daily 9. H/o adenomatous polpy 2012-stable 10. Gerd-On protonix-see #2-might have to d/c the same 11. Hld-cont zocor 40 daily  SCD  Code Status: Full  Family Communication: none present  Disposition Plan: Per Pulm and Work-up   Diet: heart Fluids: none DVT Prophylaxis: scd  Code Status: Full Family Communication: none  Disposition Plan: inpatient  Consultants:  Pulmonology  ENT  Procedures:  bronch   Antibiotics  Anti-infectives   Start     Dose/Rate Route Frequency Ordered Stop   01/13/14 1600  levofloxacin (LEVAQUIN) IVPB 750 mg     750 mg 100 mL/hr over 90 Minutes Intravenous Every 24 hours 01/12/14 2037     01/12/14 1500  levofloxacin (LEVAQUIN) IVPB 750 mg     750 mg 100 mL/hr over 90 Minutes Intravenous  Once 01/12/14 1459 01/12/14 1648   01/12/14 0000  levofloxacin (LEVAQUIN) 500 MG tablet     500 mg Oral Daily 01/12/14 1551       Antibiotics Given (last 72 hours)   Date/Time Action Medication Dose Rate   01/13/14 1633 Given   levofloxacin (LEVAQUIN) IVPB 750 mg 750 mg 100 mL/hr   01/14/14 1602 Given   levofloxacin (LEVAQUIN) IVPB 750 mg 750 mg 100 mL/hr      HPI/Subjective: - no complaints  Objective: Filed Vitals:   01/14/14 1200 01/14/14 1205 01/14/14  1210 01/14/14 1329  BP: 112/63 108/62 111/52 121/72  Pulse: 109 110 113 113  Temp:    98.5 F (36.9 C)  TempSrc:    Oral  Resp: 26 27 35 20  Height:      Weight:      SpO2: 96% 97% 95% 96%    Intake/Output Summary (Last 24 hours) at 01/14/14 1719 Last data filed at 01/14/14 0730  Gross per 24 hour  Intake    900 ml  Output    520 ml  Net    380 ml   Filed Weights   01/12/14 2011 01/14/14 0540  Weight: 90.266 kg (199 lb) 89.858 kg (198 lb 1.6 oz)    Exam:   General:  NAD  Cardiovascular: regular rate and rhythm, without MRG  Respiratory: good air movement, no wheezing, ronchi or rales  Abdomen: soft, not tender to palpation, positive bowel sounds  MSK: no peripheral edema  Neuro: non focal  Data Reviewed: Basic Metabolic Panel:  Recent Labs Lab 01/08/14 1000 01/12/14 1345 01/13/14 0345  NA 135* 135* 129*  K 4.5 4.4 3.5*  CL  --  94* 92*  CO2 25 24 23   GLUCOSE 224* 223* 224*  BUN 18.6 10 8   CREATININE 1.1 0.89 0.78  CALCIUM 10.1 9.1 8.0*   Liver Function Tests:  Recent Labs Lab 01/08/14 1000  AST 13  ALT 14  ALKPHOS 115  BILITOT 0.52  PROT 7.1  ALBUMIN 3.1*   CBC:  Recent Labs Lab 01/08/14  1000  01/12/14 1345 01/13/14 0345 01/13/14 1208 01/13/14 1959 01/14/14 0350 01/14/14 1420  WBC 7.3  --  11.1*  --   --   --   --   --   NEUTROABS 6.1  --   --   --   --   --   --   --   HGB 13.1  < > 12.9* 11.0* 12.0* 11.6* 10.9* 11.4*  HCT 38.8  < > 38.3* 31.9* 35.0* 33.6* 32.1* 34.1*  MCV 95.7  --  93.9  --   --   --   --   --   PLT 207  --  240  --   --   --   --   --   < > = values in this interval not displayed.  BNP (last 3 results)  Recent Labs  01/12/14 1345  PROBNP 204.4*   CBG:  Recent Labs Lab 01/13/14 1128 01/13/14 1658 01/13/14 2251 01/14/14 0747 01/14/14 1429  GLUCAP 211* 244* 118* 125* 101*    Recent Results (from the past 240 hour(s))  CLOSTRIDIUM DIFFICILE BY PCR     Status: None   Collection Time    01/13/14   1:43 PM      Result Value Ref Range Status   C difficile by pcr NEGATIVE  NEGATIVE Final   Comment: Performed at [facility]     Studies: Ct Angio Chest Pe W/cm &/or Wo Cm  01/12/2014   CLINICAL DATA:  Shortness of breath and hemoptysis. Hypoxia. Lung cancer. Received radiation therapy within last 3 months.  EXAM: CT ANGIOGRAPHY CHEST WITH CONTRAST  TECHNIQUE: Multidetector CT imaging of the chest was performed using the standard protocol during bolus administration of intravenous contrast. Multiplanar CT image reconstructions and MIPs were obtained to evaluate the vascular anatomy.  CONTRAST:  197mL OMNIPAQUE IOHEXOL 350 MG/ML SOLN  COMPARISON:  CT CHEST W/CM dated 12/19/2013  FINDINGS: Respiratory motion somewhat degrades image quality. No definite pulmonary embolus. Right hilar adenopathy measures 2.5 x 3.1 cm, likely stable from 12/19/2013. Mediastinal lymph nodes are not enlarged by CT size criteria. No axillary adenopathy. Heart is at the upper limits of normal in size. No pericardial effusion.  Mild centrilobular emphysema. There is a basilar distribution of subpleural reticulation, ground-glass, architectural distortion, and traction bronchiectasis/bronchiolectasis. Septal thickening appears increased from 12/19/2013. There are areas of patchy central parenchymal involvement, right greater than left. No pleural fluid. Narrowing of right upper lobe bronchi. Airway is otherwise unremarkable.  Incidental imaging of the upper at shows a sub cm low-attenuation lesion in the left hepatic lobe, as before. Visualized portions of the liver, adrenal glands, spleen and stomach are otherwise unremarkable, with exception of a tiny hiatal hernia. No worrisome lytic or sclerotic lesions. Degenerative changes are seen throughout spine. Remote median sternotomy.  Review of the MIP images confirms the above findings.  IMPRESSION: 1. Image quality is somewhat degraded by respiratory motion. No definite pulmonary  embolus. 2. Right hilar mass is likely stable from 12/19/2013. 3. Patchy ground-glass and septal thickening may be due to pulmonary edema and/or pulmonary hemorrhage. 4. 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   Electronically Signed   By: Lorin Picket M.D.   On: 01/12/2014 18:04    Scheduled Meds: . amLODipine  5 mg Oral Daily   And  . valsartan  160 mg Oral Daily  . insulin aspart  0-15  Units Subcutaneous TID WC  . levofloxacin (LEVAQUIN) IV  750 mg Intravenous Q24H  . linagliptin  5 mg Oral Daily  . metoprolol succinate  25 mg Oral QHS  . mupirocin ointment   Nasal TID  . oxymetazoline  3 spray Each Nare TID  . pantoprazole  40 mg Oral Daily  . simvastatin  10 mg Oral q1800   Continuous Infusions: . sodium chloride 75 mL/hr at 01/14/14 6116    Principal Problem:   Hemoptysis Active Problems:   HYPERLIPIDEMIA-MIXED   HYPERTENSION, BENIGN   Malignant neoplasm of upper lobe, bronchus or lung   Hypoxia   CAP (community acquired pneumonia)   Time spent: [place], MD [facility] Pager [phone]. If 7 PM - 7 AM, please contact night-coverage at [email or web address], password [facility] 01/14/2014, 5:19 PM  LOS: 2 days

## 2014-01-14 NOTE — Discharge Instructions (Signed)
As discussed, it is important that you follow up as soon as possible with your physician for continued management of your condition.  These used to be provided inhaler every 4 or 6 hours as needed for additional cough suppression.  If you develop any new, or concerning changes in your condition, please return to the emergency department immediately.  Flexible Bronchoscopy, Care After These instructions give you information on caring for yourself after your procedure. Your doctor may also give you more specific instructions. Call your doctor if you have any problems or questions after your procedure. HOME CARE  Do not eat or drink anything for 2 hours after your procedure. If you try to eat or drink before the medicine wears off, food or drink could go into your lungs. You could also burn yourself.  After 2 hours have passed and when you can cough and gag normally, you may eat soft food and drink liquids slowly.  The day after the test, you may eat your normal diet.  You may do your normal activities.  Keep all doctor visits. GET HELP RIGHT AWAY IF:  You get more and more short of breath.  You get lightheaded.  You feel like you are going to pass out (faint).  You have chest pain.  You have new problems that worry you.  You cough up more than a little blood.  You cough up more blood than before. MAKE SURE YOU:  Understand these instructions.  Will watch your condition.  Will get help right away if you are not doing well or get worse. Document Released: 09/17/2009 Document Revised: 09/10/2013 Document Reviewed: 07/25/2013 [facility] Patient Information 2014 [place].

## 2014-01-14 NOTE — H&P (View-Only) (Signed)
PULMONARY  / CRITICAL CARE MEDICINE  Name: David [name] MRN: [id number] DOB: 1941-11-09 PCP [name], MD    ADMISSION DATE:  01/12/2014  LOS 1 days   CONSULTATION DATE:  01/13/2014   REFERRING MD :  Dr Verneita Griffes PRIMARY SERVICE: [facility]  CHIEF COMPLAINT:  Hemoptysis v epistaxis  BRIEF PATIENT DESCRIPTION: Hemoptysius v epistaxis in stage 3A lung cancer patient  SIGNIFICANT EVENTS / STUDIES:  01/12/2014 - admit   LINES / TUBES: none  CULTURES: Results for orders placed during the hospital encounter of 09/08/13  AFB CULTURE WITH SMEAR     Status: None   Collection Time    09/08/13  8:16 AM      Result Value Range Status   Specimen Description BRONCHIAL WASHINGS   Final   Special Requests PT ON ANCEF   Final   ACID FAST SMEAR     Final   Value: NO ACID FAST BACILLI SEEN     Performed at [facility]   Culture     Final   Value: NO ACID FAST BACILLI ISOLATED IN 6 WEEKS     Performed at [facility]   Report Status 10/21/2013 FINAL   Final  CULTURE, RESPIRATORY (NON-EXPECTORATED)     Status: None   Collection Time    09/08/13  8:16 AM      Result Value Range Status   Specimen Description BRONCHIAL WASHINGS   Final   Special Requests PT ON ANCEF   Final   Gram Stain     Final   Value: MODERATE WBC PRESENT,BOTH PMN AND MONONUCLEAR     NO SQUAMOUS EPITHELIAL CELLS SEEN     NO ORGANISMS SEEN     Performed at [facility]   Culture     Final   Value: NO GROWTH 2 DAYS     Performed at [facility]   Report Status 09/10/2013 FINAL   Final  FUNGUS CULTURE W SMEAR     Status: None   Collection Time    09/08/13  8:16 AM      Result Value Range Status   Specimen Description BRONCHIAL WASHINGS   Final   Special Requests PT ON ANCEF   Final   Fungal Smear     Final   Value: NO YEAST OR FUNGAL ELEMENTS SEEN     Performed at [facility]   Culture     Final   Value: No Fungi Isolated in 4 Weeks     Performed at  [facility]   Report Status 10/06/2013 FINAL   Final     ANTIBIOTICS: Anti-infectives   Start     Dose/Rate Route Frequency Ordered Stop   01/13/14 1600  levofloxacin (LEVAQUIN) IVPB 750 mg     750 mg 100 mL/hr over 90 Minutes Intravenous Every 24 hours 01/12/14 2037     01/12/14 1500  levofloxacin (LEVAQUIN) IVPB 750 mg     750 mg 100 mL/hr over 90 Minutes Intravenous  Once 01/12/14 1459 01/12/14 1648   01/12/14 0000  levofloxacin (LEVAQUIN) 500 MG tablet     500 mg Oral Daily 01/12/14 1551         HISTORY OF PRESENT ILLNESS:  73 year old gentleman with COPD and otherwise specified and undiagnosed UIP on CT chest with stage IIIA non-small cell lung cancer the care of Dr. [name]. Status post radiation therapy to right chest October 2014 through December 2014 and ongoing chemotherapy  Of note, 09/08/2013 he  underwent bronchoscopy with endobronchial ultrasound for the diagnosis of non-small cell lung cancer. It was noted that he had a R3 segmental bronchus and with endobronchial lesion. He biopsy of the station 7 and 4 or lymph nodes. It must be noted that the endobronchial biopsyR3  endobronchial lesion was nondiagnostic and it was EBUS  produced positive results   A few weeks ago started having epistaxis due to the cold weather but a few days later this progress to his chest and he started having hemoptysis but no further epistaxis. However, he is uncertain if this is epistaxis that drains into his chest or this is primary hemoptysis. Hemoptysis is daily and has progressively gotten worse. He only coughs one or 2 times a day and each time it's a big glob of blood. Certainly it is not massive or submassive hemoptysis . He otherwise feels well without any sputum production, fever, edema, chest pain, weight loss or associated other symptoms. Hemoptysis is not associated with anti-coagulation  . CT hest at admission so stable right hilar mass comapred to earlier in mid-jan 2015  PAST  MEDICAL HISTORY :  Past Medical History  Diagnosis Date  . Coronary artery disease     s/p PCI 2000 and single vessel CABG 2001  . Hypercholesterolemia   . GERD (gastroesophageal reflux disease)   . Tubular adenoma 2003  . COPD (chronic obstructive pulmonary disease)   . Diabetes mellitus   . Arthritis   . Allergic rhinitis   . Cancer     lung mass  . Hypertension     Dr Burt Knack  . History of radiation therapy 09/23/2013-11/12/2013    63 gray to right central chest     Family History  Problem Relation Age of Onset  . Heart failure Mother   . Cancer Father     lung canger  . Cancer Brother     lung cancer  . Anesthesia problems Neg Hx   . Hypotension Neg Hx   . Malignant hyperthermia Neg Hx   . Pseudochol deficiency Neg Hx      History   Social History  . Marital Status: Married    Spouse Name: N/A    Number of Children: N/A  . Years of Education: N/A   Occupational History  . Not on file.   Social History Main Topics  . Smoking status: Former Smoker -- 1.50 packs/day for 40 years    Types: Cigarettes    Quit date: 09/19/1995  . Smokeless tobacco: Not on file  . Alcohol Use: 7.0 oz/week    14 drink(s) per week     Comment: scotch 1 shot daily  . Drug Use: No  . Sexual Activity: Not on file   Other Topics Concern  . Not on file   Social History Narrative  . No narrative on file     No Known Allergies    (Not in an outpatient encounter)     REVIEW OF SYSTEMS:  Per HPI. Rest 11 point ROS was negative  SUBJECTIVE:   VITAL SIGNS: Filed Vitals:   01/12/14 2011 01/12/14 2205 01/13/14 0240 01/13/14 0500  BP: 140/72   132/70  Pulse: 113   109  Temp: 98.2 F (36.8 C)   98.3 F (36.8 C)  TempSrc: Oral   Oral  Resp: 22   22  Height: 5\' 9"  (1.753 m)     Weight: 199 lb (90.266 kg)     SpO2: 98% 98% 97% 99%  HEMODYNAMICS:   VENTILATOR SETTINGS: Vent Mode:  [-] PRVC FiO2 (%):  [30 %] 30 % Set Rate:  [28 bmp] 28 bmp Vt Set:  [420 mL]  420 mL PEEP:  [5 cmH20] 5 cmH20 Plateau Pressure:  [4 TDD22-02 cmH20] 20 cmH20 INTAKE / OUTPUT: I/O last 3 completed shifts: In: 617.5 [I.V.:617.5] Out: 800 [Urine:800]     PHYSICAL EXAMINATION: General:  Alert and oriented x3. Obese, looks well Neuro:  Alert and oriented x3. Glasgow Coma Scale 15. CAM-ICU negative for delirium. Moves all fours. HEENT:  Mallampati class II. He is irritated in both nostrils medial aspect in the Little's area suggesting of epistaxis Cardiovascular:  S1-S2 present. No murmurs Lungs:  Mild barrel chest. Overall air entry diminished . Mild crackles in the base. Abdomen:  Soft nontender no organomegaly Musculoskeletal:  No cyanosis, no clubbing no edema Skin:  Intact  LABS: PULMONARY No results found for this basename: PHART, PCO2, PCO2ART, PO2, PO2ART, HCO3, TCO2, O2SAT,  in the last 168 hours  CBC  Recent Labs Lab 01/08/14 1000 01/12/14 1345 01/13/14 0345  HGB 13.1 12.9* 11.0*  HCT 38.8 38.3* 31.9*  WBC 7.3 11.1*  --   PLT 207 240  --     COAGULATION No results found for this basename: INR,  in the last 168 hours  CARDIAC  No results found for this basename: TROPONINI,  in the last 168 hours  Recent Labs Lab 01/12/14 1345  PROBNP 204.4*     CHEMISTRY  Recent Labs Lab 01/08/14 1000 01/12/14 1345 01/13/14 0345  NA 135* 135* 129*  K 4.5 4.4 3.5*  CL  --  94* 92*  CO2 25 24 23   GLUCOSE 224* 223* 224*  BUN 18.6 10 8   CREATININE 1.1 0.89 0.78  CALCIUM 10.1 9.1 8.0*   Estimated Creatinine Clearance: 92.7 ml/min (by C-G formula based on Cr of 0.78).   LIVER  Recent Labs Lab 01/08/14 1000  AST 13  ALT 14  ALKPHOS 115  BILITOT 0.52  PROT 7.1  ALBUMIN 3.1*     INFECTIOUS No results found for this basename: LATICACIDVEN, PROCALCITON,  in the last 168 hours   ENDOCRINE CBG (last 3)   Recent Labs  01/12/14 2222 01/13/14 0736  GLUCAP 217* 197*         IMAGING x48h  Ct Angio Chest Pe W/cm &/or Wo  Cm  01/12/2014   CLINICAL DATA:  Shortness of breath and hemoptysis. Hypoxia. Lung cancer. Received radiation therapy within last 3 months.  EXAM: CT ANGIOGRAPHY CHEST WITH CONTRAST  TECHNIQUE: Multidetector CT imaging of the chest was performed using the standard protocol during bolus administration of intravenous contrast. Multiplanar CT image reconstructions and MIPs were obtained to evaluate the vascular anatomy.  CONTRAST:  11mL OMNIPAQUE IOHEXOL 350 MG/ML SOLN  COMPARISON:  CT CHEST W/CM dated 12/19/2013  FINDINGS: Respiratory motion somewhat degrades image quality. No definite pulmonary embolus. Right hilar adenopathy measures 2.5 x 3.1 cm, likely stable from 12/19/2013. Mediastinal lymph nodes are not enlarged by CT size criteria. No axillary adenopathy. Heart is at the upper limits of normal in size. No pericardial effusion.  Mild centrilobular emphysema. There is a basilar distribution of subpleural reticulation, ground-glass, architectural distortion, and traction bronchiectasis/bronchiolectasis. Septal thickening appears increased from 12/19/2013. There are areas of patchy central parenchymal involvement, right greater than left. No pleural fluid. Narrowing of right upper lobe bronchi. Airway is otherwise unremarkable.  Incidental imaging of the upper at shows a sub cm low-attenuation lesion in  the left hepatic lobe, as before. Visualized portions of the liver, adrenal glands, spleen and stomach are otherwise unremarkable, with exception of a tiny hiatal hernia. No worrisome lytic or sclerotic lesions. Degenerative changes are seen throughout spine. Remote median sternotomy.  Review of the MIP images confirms the above findings.  IMPRESSION: 1. Image quality is somewhat degraded by respiratory motion. No definite pulmonary embolus. 2. Right hilar mass is likely stable from 12/19/2013. 3. Patchy ground-glass and septal thickening may be due to pulmonary edema and/or pulmonary hemorrhage. 4. 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   Electronically Signed   By: Lorin Picket M.D.   On: 01/12/2014 18:04   Dg Chest Port 1 View  01/12/2014   CLINICAL DATA:  Shortness of breath and weakness  EXAM: PORTABLE CHEST - 1 VIEW  COMPARISON:  12/19/2013  FINDINGS: Cardiac shadow is stable. Postoperative changes are noted. Changes are noted in the right mid and lower lung similar to that seen on the prior exam however there is a superimposed component of increased opacification. This may represent acute on chronic infiltrate. Correlation with the clinical exam is recommended. The left lung remains clear. No bony abnormality is seen.  IMPRESSION: Likely acute on chronic infiltrate.   Electronically Signed   By: Inez Catalina M.D.   On: 01/12/2014 14:07       ASSESSMENT / PLAN:  PULMONARY A:Hemoptysis V Epistaxis: 50% probability for either based on history and exam P:   Rec ENT consult Will plan bronch for airway exam 11.30am 01/14/14 at [facility]; airway exam with focus on Rt sided endobronchial lesion in Right Upper Lobe NPO after 3am 01/14/14 Wife updated in detail No heparin or lovenox or plavix till sorted out; SCDs for dvt prophylaxs  D/w Dr Verneita Griffes of [facility]      Dr. Brand Males, M.D., [facility]C.P Pulmonary and Critical Care Medicine Staff Physician [place] [facility] Pager: [phone], If no answer or between  15:00h - 7:00h: call [phone]  01/13/2014 9:57 AM

## 2014-01-14 NOTE — Op Note (Addendum)
Name:  David [name] MRN:  [id number] DOB:  Feb 09, 1941  PROCEDURE NOTE  Procedure(s): Flexible bronchoscopy [phone])   Indications:  RUL lung cancer stage 3A, hemoptysis - new  Consent:  Procedure, benefits, risks and alternatives discussed.  Questions answered.  Consent obtained.  Anesthesia: Moderate sedation  Procedure summary:  Appropriate equipment was assembled.  The patient was brought to the endoscopy suite and identified as David [name].  Safety timeout was performed. The patient was placed supine on the operating table,  and moderate sedation ral anesthesia given by me.    After the appropriate level of anesthesia was assured, flexible video bronchoscope was lubricated and inserted through the oral route (nasal route avoided due to crusting and recent cautery).  Total of 40 mL of 1% Lidocaine were administered through the bronchoscope to augment sedation  Airway examination was performed bilaterally to subsegmental level.  Minimal clear secretions were noted, mucosa appeared normal and no endobronchial lesions were identified EXCEPT in RIGHT UPPER LOBE POSTERIOR SEGMENT. THIS ENTRANCE TO RUL POSTERIOR SEGMENT WAS COMPLETELY OCCLUDED BY ENDOBRONCHIAL LESION AND IT WAS OBVIOUS THIS WAS SOURCE OF HEMOPTYSIS AS SOME BLOOD COULD BE SEEN. NOTE: visualization of this was improved with help of 1:10K epi pre-diluted in 1:49 ratio and using 5cc of this  No BAL or Biopsy was done  After hemostasis was assured, the bronchoscope was withdrawn.   Specimens sent: NONE  Complications:  No immediate complications were noted.  Hemodynamic parameters and oxygenation remained stable throughout the procedure except for transient hypoxemia  Estimated blood loss: none  FINDINGS Endobroncial lesion right upper lobe posterior segment occulding entrance to this segment and source of hemoptysis  POSTOP PLAN Recover per guidelines D/w -  need [facility] transfer for laser Rx I will attempt to  get hold of Dr Kerin Ransom at [facility]; have emailed him; keep inpatient till then  [facility] and patient updated in detail. AGreeable with plan    David [name], M.D., [facility].C.P Pulmonary and Critical Care Medicine Staff Physician [place] [facility] Pager: [phone], If no answer or between  15:00h - 7:00h: call [phone]  01/14/2014 12:00 PM

## 2014-01-15 ENCOUNTER — Ambulatory Visit
Admit: 2014-01-15 | Discharge: 2014-01-15 | Disposition: A | Discharge status: Home / Self Care | Payer: Medicare HMO | Attending: Radiation Oncology | Admitting: Radiation Oncology

## 2014-01-15 ENCOUNTER — Other Ambulatory Visit (HOSPITAL_COMMUNITY): Payer: Medicare HMO

## 2014-01-15 ENCOUNTER — Encounter (HOSPITAL_COMMUNITY): Payer: Self-pay | Admitting: Internal Medicine

## 2014-01-15 DIAGNOSIS — C3410 Malignant neoplasm of upper lobe, unspecified bronchus or lung: Secondary | ICD-10-CM

## 2014-01-15 LAB — CBC
HCT: 32.1 % — ABNORMAL LOW (ref 39.0–52.0)
Hemoglobin: 10.6 g/dL — ABNORMAL LOW (ref 13.0–17.0)
MCH: 30.4 pg (ref 26.0–34.0)
MCHC: 33 g/dL (ref 30.0–36.0)
MCV: 92 fL (ref 78.0–100.0)
Platelets: 269 10*3/uL (ref 150–400)
RBC: 3.49 MIL/uL — ABNORMAL LOW (ref 4.22–5.81)
RDW: 13.1 % (ref 11.5–15.5)
WBC: 12.7 10*3/uL — ABNORMAL HIGH (ref 4.0–10.5)

## 2014-01-15 LAB — BASIC METABOLIC PANEL
BUN: 6 mg/dL (ref 6–23)
CO2: 22 mEq/L (ref 19–32)
Calcium: 8.2 mg/dL — ABNORMAL LOW (ref 8.4–10.5)
Chloride: 97 mEq/L (ref 96–112)
Creatinine, Ser: 0.69 mg/dL (ref 0.50–1.35)
GFR calc Af Amer: >90 mL/min (ref >90)
GFR calc non Af Amer: >90 mL/min (ref >90)
Glucose, Bld: 194 mg/dL — ABNORMAL HIGH (ref 70–99)
Potassium: 3.6 mEq/L — ABNORMAL LOW (ref 3.7–5.3)
Sodium: 132 mEq/L — ABNORMAL LOW (ref 137–147)

## 2014-01-15 LAB — GLUCOSE, CAPILLARY
GLUCOSE-CAPILLARY: 177 mg/dL — AB (ref 70–99)
Glucose-Capillary: 175 mg/dL — ABNORMAL HIGH (ref 70–99)
Glucose-Capillary: 175 mg/dL — ABNORMAL HIGH (ref 70–99)
Glucose-Capillary: 186 mg/dL — ABNORMAL HIGH (ref 70–99)

## 2014-01-15 NOTE — Progress Notes (Signed)
Radiation Oncology         [phone] ________________________________  Name: David [name] MRN: [id number]  Date: 01/15/2014  DOB: March 07, 1941  Re-evaluation note  CC: [name], MD  [name], MD  Diagnosis:   Malignant neoplasm of upper lobe, bronchus or lung, squamous cell   Primary site: Lung (Right)   Staging method: AJCC 7th Edition   Clinical: Stage IIIA (T2a, N2, M0)    Summary: Stage IIIA (T2a, N2, M0)   Interval Since Last Radiation:  2  months,  the patient completed 3-D conformal radiation therapy directed at the right hilar region, total dose 63 gray in 35 fractions with radiosensitizing chemotherapy.  Narrative:  The patient is seen today for further evaluation as part of management of patient's stage III non-small cell lung cancer. Patient completed his radiation therapy approximately 2 months ago and has since been receiving chemotherapy under the direction of Dr. [name]. Initial scan after his radiation therapy showed shrinkage of his right hilar mass. More recently the patient is been having some problems with epistaxis and more recently hemoptysis. Over the weekend the patient's hemoptysis became quite severe. Patient was admitted to the hospital February 9. Yesterday the patient underwent bronchoscopy by Dr. [name]. The patient was noted to have complete occlusion of the entrance to the right upper lobe bronchus posterior segment by a lesion. This was felt to be the obvious source of hemoptysis. Patient did undergo evaluation by ENT to rule out epistaxis as cause of his bleeding. There were no lesions noted in the nasal cavity or nasopharynx or signs of bleeding. The location of the endobronchial lesion is too distal to consider laser.  Radiation therapy has been consulted for consideration for additional treatment in light of the patient's hemoptysis. The patient is also being evaluated for arterial embolization to help with his bleeding.                              ALLERGIES:  has No Known Allergies.  Meds: No current facility-administered medications for this encounter.   Current Outpatient Prescriptions  Medication Sig Dispense Refill  . levofloxacin (LEVAQUIN) 500 MG tablet Take 1 tablet (500 mg total) by mouth daily.  10 tablet  0   Facility-Administered Medications Ordered in Other Encounters  Medication Dose Route Frequency Provider Last Rate Last Dose  . 0.9 %  sodium chloride infusion   Intravenous Continuous [name], MD 75 mL/hr at 01/15/14 1010    . acetaminophen (TYLENOL) tablet 650 mg  650 mg Oral Q6H PRN [name], MD       Or  . acetaminophen (TYLENOL) suppository 650 mg  650 mg Rectal Q6H PRN [name], MD      . albuterol (PROVENTIL) (2.5 MG/3ML) 0.083% nebulizer solution 2.5 mg  2.5 mg Nebulization Q4H PRN [name], MD      . amLODipine (NORVASC) tablet 5 mg  5 mg Oral Daily [name], MD   5 mg at 01/15/14 1012   And  . valsartan (DIOVAN) 160 MG tablet 160 mg  160 mg Oral Daily [name], MD   160 mg at 01/15/14 1011  . dextromethorphan (DELSYM) 30 MG/5ML liquid 60 mg  60 mg Oral PRN [name], MD   60 mg at 01/15/14 1337  . guaiFENesin-codeine 100-10 MG/5ML solution 5 mL  5 mL Oral Q4H PRN [name], MD      . HYDROcodone-acetaminophen (NORCO/VICODIN) 5-325  MG per tablet 1 tablet  1 tablet Oral Q6H PRN [name], MD      . insulin aspart (novoLOG) injection 0-15 Units  0-15 Units Subcutaneous TID WC [name], MD   3 Units at 01/15/14 1226  . levofloxacin (LEVAQUIN) IVPB 750 mg  750 mg Intravenous Q24H [name], MD   750 mg at 01/14/14 1602  . linagliptin (TRADJENTA) tablet 5 mg  5 mg Oral Daily [name], MD   5 mg at 01/15/14 1011  . metoprolol succinate (TOPROL-XL) 24 hr tablet 25 mg  25 mg Oral QHS [name], MD   25 mg at 01/14/14 2314  . mupirocin ointment (BACTROBAN) 2 %   Nasal TID [name], MD      . ondansetron  [facility]) tablet 4 mg  4 mg Oral Q6H PRN [name], MD       Or  . ondansetron (ZOFRAN) injection 4 mg  4 mg Intravenous Q6H PRN [name], MD      . oxymetazoline (AFRIN) 0.05 % nasal spray 3 spray  3 spray Each Nare TID [name], MD   3 spray at 01/15/14 1012  . pantoprazole (PROTONIX) EC tablet 40 mg  40 mg Oral Daily [name], MD   40 mg at 01/15/14 1012  . polyvinyl alcohol (LIQUIFILM TEARS) 1.4 % ophthalmic solution 1 drop  1 drop Both Eyes TID PRN [name], MD      . simvastatin (ZOCOR) tablet 10 mg  10 mg Oral q1800 [name], MD   10 mg at 01/14/14 1814    Physical Findings: The patient is in no acute distress. Patient is alert and oriented. He is lying comfortably on his hospital bed this afternoon. He is accompanied by his wife and daughter. He has alopecia from his chemotherapy.   Lab Findings: Lab Results  Component Value Date   WBC 12.7* 01/15/2014   HGB 10.6* 01/15/2014   HCT 32.1* 01/15/2014   MCV 92.0 01/15/2014   PLT 269 01/15/2014     Radiographic Findings: Ct Chest W Contrast  12/19/2013   CLINICAL DATA:  Lung cancer, chemotherapy complete.  Cough.  EXAM: CT CHEST WITH CONTRAST  TECHNIQUE: Multidetector CT imaging of the chest was performed during intravenous contrast administration.  CONTRAST:  65mL OMNIPAQUE IOHEXOL 300 MG/ML  SOLN  COMPARISON:  NM PET IMAGE INITIAL (PI) SKULL BASE TO THIGH dated 09/02/2013; CT CHEST W/CM dated 08/19/2013  FINDINGS: Mediastinal lymph nodes measure up to 8 mm in short axis in the lower right paratracheal station (previously 10 mm). Right hilar mass measures 2.0 x 3.1 cm (previously 3.4 x 4.8 cm). Soft tissue thickening extends along the right upper lobe bronchi. No left hilar or axillary adenopathy. Atherosclerotic calcification of the arterial vasculature. Heart size within normal limits. No pericardial effusion. There may be a tiny hiatal hernia.  Combination of mild centrilobular and paraseptal emphysema.  A peripheral and basilar predominant pattern of subpleural reticulation, traction bronchiolectasis and mild architectural distortion appears progressive from 08/19/2013. A focal area of ground-glass is seen in the posterior segment right upper lobe with some involvement of the superior segment right lower lobe. No pleural fluid. There is narrowing of the posterior segmental bronchus to the right upper lobe. Airway is otherwise unremarkable.  Incidental imaging of the upper abdomen shows no acute findings. No worrisome lytic or sclerotic lesions. Degenerative changes are seen in the spine.  IMPRESSION: 1. Interval decrease in size of a right hilar mass and mediastinal  lymph nodes with evolving changes of radiation therapy in the right upper and right lower lobes. 2. Pulmonary parenchymal pattern of fibrosis appears progressive from 08/19/2013. 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   Electronically Signed   By: Lorin Picket M.D.   On: 12/19/2013 11:37   Ct Angio Chest Pe W/cm &/or Wo Cm  01/12/2014   CLINICAL DATA:  Shortness of breath and hemoptysis. Hypoxia. Lung cancer. Received radiation therapy within last 3 months.  EXAM: CT ANGIOGRAPHY CHEST WITH CONTRAST  TECHNIQUE: Multidetector CT imaging of the chest was performed using the standard protocol during bolus administration of intravenous contrast. Multiplanar CT image reconstructions and MIPs were obtained to evaluate the vascular anatomy.  CONTRAST:  168mL OMNIPAQUE IOHEXOL 350 MG/ML SOLN  COMPARISON:  CT CHEST W/CM dated 12/19/2013  FINDINGS: Respiratory motion somewhat degrades image quality. No definite pulmonary embolus. Right hilar adenopathy measures 2.5 x 3.1 cm, likely stable from 12/19/2013. Mediastinal lymph nodes are not enlarged by CT size criteria. No axillary adenopathy. Heart is at the upper limits of normal in size. No pericardial effusion.  Mild centrilobular emphysema. There is a basilar  distribution of subpleural reticulation, ground-glass, architectural distortion, and traction bronchiectasis/bronchiolectasis. Septal thickening appears increased from 12/19/2013. There are areas of patchy central parenchymal involvement, right greater than left. No pleural fluid. Narrowing of right upper lobe bronchi. Airway is otherwise unremarkable.  Incidental imaging of the upper at shows a sub cm low-attenuation lesion in the left hepatic lobe, as before. Visualized portions of the liver, adrenal glands, spleen and stomach are otherwise unremarkable, with exception of a tiny hiatal hernia. No worrisome lytic or sclerotic lesions. Degenerative changes are seen throughout spine. Remote median sternotomy.  Review of the MIP images confirms the above findings.  IMPRESSION: 1. Image quality is somewhat degraded by respiratory motion. No definite pulmonary embolus. 2. Right hilar mass is likely stable from 12/19/2013. 3. Patchy ground-glass and septal thickening may be due to pulmonary edema and/or pulmonary hemorrhage. 4. 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   Electronically Signed   By: Lorin Picket M.D.   On: 01/12/2014 18:04   Dg Chest Port 1 View  01/12/2014   CLINICAL DATA:  Shortness of breath and weakness  EXAM: PORTABLE CHEST - 1 VIEW  COMPARISON:  12/19/2013  FINDINGS: Cardiac shadow is stable. Postoperative changes are noted. Changes are noted in the right mid and lower lung similar to that seen on the prior exam however there is a superimposed component of increased opacification. This may represent acute on chronic infiltrate. Correlation with the clinical exam is recommended. The left lung remains clear. No bony abnormality is seen.  IMPRESSION: Likely acute on chronic infiltrate.   Electronically Signed   By: Inez Catalina M.D.   On: 01/12/2014 14:07    Impression:  Stage  III non-small cell lung cancer. The Patient completed a definitive course of radiation therapy along with radiosensitizing chemotherapy. I carefully reviewed the patient's previous radiation fields as it relates to his current problem. This area of the right upper lobe bronchus region was well covered with his radiation fields (63 gray).  Retreatment to this region would have inherent risks with high-dose radiation therapy and potential for severe fibrosis with possible hemorrhage.  The Patient however at this time is faced with a significant  problem from his endobronchial lesion.  As a last resort the  patient may be a potential candidate for a limited course of SBRT directed at the endobronchial lesion assuming this can be localized on a 4D treatment planning CT.  I discussed this potential treatment with the patient and his family this afternoon and they seem receptive to this if severe hemoptysis continues to be a problem.  Plan:  Patient is tentatively scheduled for interventional radiology procedure with potential arterial embolization. I've also tentatively scheduled the patient for 4D treatment planning CT at 9:30 AM on Friday morning but this could be postponed if this interferes with interventional radiology's procedure.  Anticipated planning time for this procedure would be approximately 1-1/2 hours.  ____________________________________ Blair Promise, MD

## 2014-01-15 NOTE — Progress Notes (Signed)
PROGRESS NOTE  David [name] UJW:[id number] DOB: 1941-02-05 DOA: 01/12/2014 PCP: [name], MD  Assessment/Plan:   Hemoptysis  - recurrent episode yesterday. Rad onc, Onc, Pulm and IR consulted.  Diarrhea - C diff negative. Stage III Lung Ca-Oncology appreciated. Has had XRT - likely needs further XRT Pulm Fibrosis-see above Mild-mod COPD per OV 07/30/06-Albuterol worsened hemoptysis-pt may hold this if he doesn't like it CAD, s/p CABG 2002-Antiplt/Anticoag on hold for now given 1. Cont Toprol 25 qhs Htn-Cont Amlodipine 5 daily, Valsartan 160 daily H/o adenomatous polpy 2012 Gerd-On protonix Hld-cont zocor 40 daily   SCD  Code Status: Full  Family Communication: none present  Disposition Plan: Per Pulm and Work-up   Diet: heart Fluids: none DVT Prophylaxis: scd  Code Status: Full Family Communication: none  Disposition Plan: inpatient  Consultants:  Pulmonology  ENT  IR  Oncology  Radiation oncology  Procedures:  bronch   Antibiotics  Anti-infectives   Start     Dose/Rate Route Frequency Ordered Stop   01/13/14 1600  levofloxacin (LEVAQUIN) IVPB 750 mg     750 mg 100 mL/hr over 90 Minutes Intravenous Every 24 hours 01/12/14 2037     01/12/14 1500  levofloxacin (LEVAQUIN) IVPB 750 mg     750 mg 100 mL/hr over 90 Minutes Intravenous  Once 01/12/14 1459 01/12/14 1648   01/12/14 0000  levofloxacin (LEVAQUIN) 500 MG tablet     500 mg Oral Daily 01/12/14 1551       Antibiotics Given (last 72 hours)   Date/Time Action Medication Dose Rate   01/13/14 1633 Given   levofloxacin (LEVAQUIN) IVPB 750 mg 750 mg 100 mL/hr   01/14/14 1602 Given   levofloxacin (LEVAQUIN) IVPB 750 mg 750 mg 100 mL/hr      HPI/Subjective: - no complaints  Objective: Filed Vitals:   01/14/14 2317 01/15/14 0100 01/15/14 0559 01/15/14 1109  BP: 140/80  106/81 124/77  Pulse:   106 110  Temp:   98.1 F (36.7 C) 98 F (36.7 C)  TempSrc:   Oral Oral  Resp:  24 24  23   Height:      Weight:   89.9 kg (198 lb 3.1 oz)   SpO2:   99% 96%    Intake/Output Summary (Last 24 hours) at 01/15/14 1336 Last data filed at 01/15/14 1016  Gross per 24 hour  Intake 1486.25 ml  Output   1640 ml  Net -153.75 ml   Filed Weights   01/12/14 2011 01/14/14 0540 01/15/14 0559  Weight: 90.266 kg (199 lb) 89.858 kg (198 lb 1.6 oz) 89.9 kg (198 lb 3.1 oz)    Exam:   General:  NAD  Cardiovascular: regular rate and rhythm, without MRG  Respiratory: good air movement, no wheezing, ronchi or rales  Abdomen: soft, not tender to palpation, positive bowel sounds  MSK: no peripheral edema  Neuro: non focal  Data Reviewed: Basic Metabolic Panel:  Recent Labs Lab 01/12/14 1345 01/13/14 0345 01/15/14 0420  NA 135* 129* 132*  K 4.4 3.5* 3.6*  CL 94* 92* 97  CO2 24 23 22   GLUCOSE 223* 224* 194*  BUN 10 8 6   CREATININE 0.89 0.78 0.69  CALCIUM 9.1 8.0* 8.2*   Liver Function Tests: No results found for this basename: AST, ALT, ALKPHOS, BILITOT, PROT, ALBUMIN,  in the last 168 hours CBC:  Recent Labs Lab 01/12/14 1345  01/13/14 1959 01/14/14 0350 01/14/14 1420 01/14/14 1955 01/15/14 0420  WBC 11.1*  --   --   --   --   --  12.7*  HGB 12.9*  < > 11.6* 10.9* 11.4* 11.3* 10.6*  HCT 38.3*  < > 33.6* 32.1* 34.1* 32.3* 32.1*  MCV 93.9  --   --   --   --   --  92.0  PLT 240  --   --   --   --   --  269  < > = values in this interval not displayed.  BNP (last 3 results)  Recent Labs  01/12/14 1345  PROBNP 204.4*   CBG:  Recent Labs Lab 01/14/14 1429 01/14/14 1723 01/14/14 2234 01/15/14 0739 01/15/14 1147  GLUCAP 101* 181* 179* 175* 175*    Recent Results (from the past 240 hour(s))  CLOSTRIDIUM DIFFICILE BY PCR     Status: None   Collection Time    01/13/14  1:43 PM      Result Value Ref Range Status   C difficile by pcr NEGATIVE  NEGATIVE Final   Comment: Performed at [facility]     Studies: No results found.  Scheduled  Meds: . amLODipine  5 mg Oral Daily   And  . valsartan  160 mg Oral Daily  . insulin aspart  0-15 Units Subcutaneous TID WC  . levofloxacin (LEVAQUIN) IV  750 mg Intravenous Q24H  . linagliptin  5 mg Oral Daily  . metoprolol succinate  25 mg Oral QHS  . mupirocin ointment   Nasal TID  . oxymetazoline  3 spray Each Nare TID  . pantoprazole  40 mg Oral Daily  . simvastatin  10 mg Oral q1800   Continuous Infusions: . sodium chloride 75 mL/hr at 01/15/14 1010    Principal Problem:   Hemoptysis Active Problems:   HYPERLIPIDEMIA-MIXED   HYPERTENSION, BENIGN   Malignant neoplasm of upper lobe, bronchus or lung   Hypoxia   CAP (community acquired pneumonia)   Time spent: [place], MD [facility] Pager [phone]. If 7 PM - 7 AM, please contact night-coverage at [email or web address], password [facility] 01/15/2014, 1:36 PM  LOS: 3 days

## 2014-01-15 NOTE — Progress Notes (Signed)
Pt coughed up a mod amount of bloody sputum into specimen container, not enough to fill to a fourth of the container. Pt denies discomfort or any sob at this time. Will cont to monitor.

## 2014-01-15 NOTE — Progress Notes (Signed)
Just took call back from Dr Fuller [place] at [facility]. Given new info (compared to my presentation to his colleague last night) of increased hemoptysis he feels likely worthwhile for IR to take a look first. Therefore, we will evalute him first thing in morning on this  Dr. Brand Males, M.D., [facility].C.P Pulmonary and Critical Care Medicine Staff Physician [facility] Pager: [phone], If no answer or between  15:00h - 7:00h: call [phone]  01/15/2014 3:32 PM

## 2014-01-15 NOTE — Progress Notes (Signed)
Patient ID: David [name], male   DOB: August 31, 1941, 73 y.o.   MRN: [id number] Request received from CCM for possible bronchial artery embolization on pt with history of stage IIIA NSC lung carcinoma and recent large volume hemoptysis. Pt currently under care of Dr. [name] for chemotherapy and completed radiotherapy to right lung in 11/2013. Bronchoscopy on 01/15/14 revealed RUL posterior segment complete occlusion with necrotic lesion (probable source of recurrent hemoptysis). Pt first began having intermittent epistaxis in 12/2013 which then progressed to more frequent coughing, worsening dyspnea and hemoptysis. Initially , volume of hemoptysis was significant but over last 24 hours amount has tapered to about 30 cc's. Pt denies recent fevers, HA's, CP, abd /back pain,N/V or bleeding at other sites. He is not on antiplatelets/anticoagulants. His level of dyspnea is back to baseline but coughing persists. Recent imaging studies have been reviewed by Dr. Kathlene Cote and case d/w Dr. [name]. Additional PMH as below. Exam: pt awake/alert; chest- few rt basilar crackles, left clear but distant ; heart- sl tachy but regular rhythm; abd- soft,+BS,NT; ext- FROM, no edema.  Filed Vitals:   01/15/14 0100 01/15/14 0559 01/15/14 1109 01/15/14 1417  BP:  106/81 124/77 124/73  Pulse:  106 110 116  Temp:  98.1 F (36.7 C) 98 F (36.7 C)   TempSrc:  Oral Oral Oral  Resp: 24 24 23 20   Height:      Weight:  198 lb 3.1 oz (89.9 kg)    SpO2:  99% 96% 97%   Past Medical History  Diagnosis Date  . Coronary artery disease     s/p PCI 2000 and single vessel CABG 2001  . Hypercholesterolemia   . GERD (gastroesophageal reflux disease)   . Tubular adenoma 2003  . COPD (chronic obstructive pulmonary disease)   . Diabetes mellitus   . Arthritis   . Allergic rhinitis   . Cancer     lung mass  . Hypertension     Dr Burt Knack  . History of radiation therapy 09/23/2013-11/12/2013    63 gray to right central chest    Past Surgical History  Procedure Laterality Date  . Left rotator cuff repair    . Left knee arthroscopy   x2    for meniscal tear  . Coronary stent placement  2001  . Right knee arthroscopy    . Bilateral carpal tunnel release    . Right shoulder arthroscopy    . Coronary artery bypass graft      occlusion of the stent and underwent one-vessel CABG in 2002  . Coronary angioplasty    . Colonoscopy  09/13/2011    Procedure: COLONOSCOPY;  Surgeon: Rogene Houston, MD;  Location: [facility];  Service: Endoscopy;  Laterality: N/A;  . Cataract extraction w/phaco  09/25/2011    Procedure: CATARACT EXTRACTION PHACO AND INTRAOCULAR LENS PLACEMENT (IOC);  Surgeon: Tonny Branch;  Location: [facility];  Service: Ophthalmology;  Laterality: Left;  CDE:10.79  . Cataract extraction w/phaco  10/05/2011    Procedure: CATARACT EXTRACTION PHACO AND INTRAOCULAR LENS PLACEMENT (IOC);  Surgeon: Tonny Branch;  Location: [facility];  Service: Ophthalmology;  Laterality: Right;  CDE: 8.45  . Video bronchoscopy with endobronchial ultrasound N/A 09/08/2013    Procedure: VIDEO BRONCHOSCOPY WITH ENDOBRONCHIAL ULTRASOUND;  Surgeon: Grace Isaac, MD;  Location: [place];  Service: Thoracic;  Laterality: N/A;  . Video bronchoscopy Bilateral 01/14/2014    Procedure: VIDEO BRONCHOSCOPY WITHOUT FLUORO;  Surgeon: Brand Males, MD;  Location: [facility];  Service: Cardiopulmonary;  Laterality: Bilateral;   Ct Chest W Contrast  12/19/2013   CLINICAL DATA:  Lung cancer, chemotherapy complete.  Cough.  EXAM: CT CHEST WITH CONTRAST  TECHNIQUE: Multidetector CT imaging of the chest was performed during intravenous contrast administration.  CONTRAST:  62mL OMNIPAQUE IOHEXOL 300 MG/ML  SOLN  COMPARISON:  NM PET IMAGE INITIAL (PI) SKULL BASE TO THIGH dated 09/02/2013; CT CHEST W/CM dated 08/19/2013  FINDINGS: Mediastinal lymph nodes measure up to 8 mm in short axis in the lower right paratracheal station (previously 10 mm). Right hilar  mass measures 2.0 x 3.1 cm (previously 3.4 x 4.8 cm). Soft tissue thickening extends along the right upper lobe bronchi. No left hilar or axillary adenopathy. Atherosclerotic calcification of the arterial vasculature. Heart size within normal limits. No pericardial effusion. There may be a tiny hiatal hernia.  Combination of mild centrilobular and paraseptal emphysema. A peripheral and basilar predominant pattern of subpleural reticulation, traction bronchiolectasis and mild architectural distortion appears progressive from 08/19/2013. A focal area of ground-glass is seen in the posterior segment right upper lobe with some involvement of the superior segment right lower lobe. No pleural fluid. There is narrowing of the posterior segmental bronchus to the right upper lobe. Airway is otherwise unremarkable.  Incidental imaging of the upper abdomen shows no acute findings. No worrisome lytic or sclerotic lesions. Degenerative changes are seen in the spine.  IMPRESSION: 1. Interval decrease in size of a right hilar mass and mediastinal lymph nodes with evolving changes of radiation therapy in the right upper and right lower lobes. 2. Pulmonary parenchymal pattern of fibrosis appears progressive from 08/19/2013. 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   Electronically Signed   By: Lorin Picket M.D.   On: 12/19/2013 11:37   Ct Angio Chest Pe W/cm &/or Wo Cm  01/12/2014   CLINICAL DATA:  Shortness of breath and hemoptysis. Hypoxia. Lung cancer. Received radiation therapy within last 3 months.  EXAM: CT ANGIOGRAPHY CHEST WITH CONTRAST  TECHNIQUE: Multidetector CT imaging of the chest was performed using the standard protocol during bolus administration of intravenous contrast. Multiplanar CT image reconstructions and MIPs were obtained to evaluate the vascular anatomy.  CONTRAST:  157mL OMNIPAQUE IOHEXOL 350 MG/ML SOLN  COMPARISON:  CT CHEST W/CM dated 12/19/2013  FINDINGS:  Respiratory motion somewhat degrades image quality. No definite pulmonary embolus. Right hilar adenopathy measures 2.5 x 3.1 cm, likely stable from 12/19/2013. Mediastinal lymph nodes are not enlarged by CT size criteria. No axillary adenopathy. Heart is at the upper limits of normal in size. No pericardial effusion.  Mild centrilobular emphysema. There is a basilar distribution of subpleural reticulation, ground-glass, architectural distortion, and traction bronchiectasis/bronchiolectasis. Septal thickening appears increased from 12/19/2013. There are areas of patchy central parenchymal involvement, right greater than left. No pleural fluid. Narrowing of right upper lobe bronchi. Airway is otherwise unremarkable.  Incidental imaging of the upper at shows a sub cm low-attenuation lesion in the left hepatic lobe, as before. Visualized portions of the liver, adrenal glands, spleen and stomach are otherwise unremarkable, with exception of a tiny hiatal hernia. No worrisome lytic or sclerotic lesions. Degenerative changes are seen throughout spine. Remote median sternotomy.  Review of the MIP images confirms the above findings.  IMPRESSION: 1. Image quality is somewhat degraded by respiratory motion. No definite pulmonary embolus. 2. Right hilar mass is likely stable from 12/19/2013. 3. Patchy ground-glass and septal thickening may be due to pulmonary edema and/or pulmonary hemorrhage. 4. 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   Electronically Signed   By: Lorin Picket M.D.   On: 01/12/2014 18:04   Dg Chest Port 1 View  01/12/2014   CLINICAL DATA:  Shortness of breath and weakness  EXAM: PORTABLE CHEST - 1 VIEW  COMPARISON:  12/19/2013  FINDINGS: Cardiac shadow is stable. Postoperative changes are noted. Changes are noted in the right mid and lower lung similar to that seen on the prior exam  however there is a superimposed component of increased opacification. This may represent acute on chronic infiltrate. Correlation with the clinical exam is recommended. The left lung remains clear. No bony abnormality is seen.  IMPRESSION: Likely acute on chronic infiltrate.   Electronically Signed   By: Inez Catalina M.D.   On: 01/12/2014 14:07  Results for orders placed during the hospital encounter of 01/12/14  CLOSTRIDIUM DIFFICILE BY PCR      Result Value Ref Range   C difficile by pcr NEGATIVE  NEGATIVE  CBC      Result Value Ref Range   WBC 11.1 (*) 4.0 - 10.5 K/uL   RBC 4.08 (*) 4.22 - 5.81 MIL/uL   Hemoglobin 12.9 (*) 13.0 - 17.0 g/dL   HCT 38.3 (*) 39.0 - 52.0 %   MCV 93.9  78.0 - 100.0 fL   MCH 31.6  26.0 - 34.0 pg   MCHC 33.7  30.0 - 36.0 g/dL   RDW 13.1  11.5 - 15.5 %   Platelets 240  150 - 400 K/uL  BASIC METABOLIC PANEL      Result Value Ref Range   Sodium 135 (*) 137 - 147 mEq/L   Potassium 4.4  3.7 - 5.3 mEq/L   Chloride 94 (*) 96 - 112 mEq/L   CO2 24  19 - 32 mEq/L   Glucose, Bld 223 (*) 70 - 99 mg/dL   BUN 10  6 - 23 mg/dL   Creatinine, Ser 0.89  0.50 - 1.35 mg/dL   Calcium 9.1  8.4 - 10.5 mg/dL   GFR calc non Af Amer 83 (*) >90 mL/min   GFR calc Af Amer >90  >90 mL/min  PRO B NATRIURETIC PEPTIDE      Result Value Ref Range   Pro B Natriuretic peptide (BNP) 204.4 (*) 0 - 125 pg/mL  BASIC METABOLIC PANEL      Result Value Ref Range   Sodium 129 (*) 137 - 147 mEq/L   Potassium 3.5 (*) 3.7 - 5.3 mEq/L   Chloride 92 (*) 96 - 112 mEq/L   CO2 23  19 - 32 mEq/L   Glucose, Bld 224 (*) 70 - 99 mg/dL   BUN 8  6 - 23 mg/dL   Creatinine, Ser 0.78  0.50 - 1.35 mg/dL   Calcium 8.0 (*) 8.4 - 10.5 mg/dL   GFR calc non Af Amer 88 (*) >90 mL/min   GFR calc Af Amer >90  >90 mL/min  HEMOGLOBIN AND HEMATOCRIT, BLOOD      Result Value Ref Range   Hemoglobin 11.0 (*) 13.0 - 17.0 g/dL   HCT 31.9 (*) 39.0 - 52.0 %  GLUCOSE, CAPILLARY      Result Value Ref Range    Glucose-Capillary 217 (*) 70 - 99 mg/dL  HEMOGLOBIN AND HEMATOCRIT, BLOOD      Result Value Ref Range   Hemoglobin 12.0 (*) 13.0 - 17.0 g/dL   HCT 35.0 (*)  39.0 - 52.0 %  HEMOGLOBIN AND HEMATOCRIT, BLOOD      Result Value Ref Range   Hemoglobin 11.6 (*) 13.0 - 17.0 g/dL   HCT 33.6 (*) 39.0 - 52.0 %  GLUCOSE, CAPILLARY      Result Value Ref Range   Glucose-Capillary 197 (*) 70 - 99 mg/dL  GLUCOSE, CAPILLARY      Result Value Ref Range   Glucose-Capillary 211 (*) 70 - 99 mg/dL  OCCULT BLOOD X 1 CARD TO LAB, STOOL      Result Value Ref Range   Fecal Occult Bld POSITIVE (*) NEGATIVE  HEMOGLOBIN AND HEMATOCRIT, BLOOD      Result Value Ref Range   Hemoglobin 10.9 (*) 13.0 - 17.0 g/dL   HCT 32.1 (*) 39.0 - 52.0 %  GLUCOSE, CAPILLARY      Result Value Ref Range   Glucose-Capillary 244 (*) 70 - 99 mg/dL  GLUCOSE, CAPILLARY      Result Value Ref Range   Glucose-Capillary 118 (*) 70 - 99 mg/dL   Comment 1 Documented in Chart     Comment 2 Notify RN    HEMOGLOBIN AND HEMATOCRIT, BLOOD      Result Value Ref Range   Hemoglobin 11.4 (*) 13.0 - 17.0 g/dL   HCT 34.1 (*) 39.0 - 52.0 %  HEMOGLOBIN AND HEMATOCRIT, BLOOD      Result Value Ref Range   Hemoglobin 11.3 (*) 13.0 - 17.0 g/dL   HCT 32.3 (*) 39.0 - 52.0 %  GLUCOSE, CAPILLARY      Result Value Ref Range   Glucose-Capillary 125 (*) 70 - 99 mg/dL  GLUCOSE, CAPILLARY      Result Value Ref Range   Glucose-Capillary 101 (*) 70 - 99 mg/dL  CBC      Result Value Ref Range   WBC 12.7 (*) 4.0 - 10.5 K/uL   RBC 3.49 (*) 4.22 - 5.81 MIL/uL   Hemoglobin 10.6 (*) 13.0 - 17.0 g/dL   HCT 32.1 (*) 39.0 - 52.0 %   MCV 92.0  78.0 - 100.0 fL   MCH 30.4  26.0 - 34.0 pg   MCHC 33.0  30.0 - 36.0 g/dL   RDW 13.1  11.5 - 15.5 %   Platelets 269  150 - 400 K/uL  BASIC METABOLIC PANEL      Result Value Ref Range   Sodium 132 (*) 137 - 147 mEq/L   Potassium 3.6 (*) 3.7 - 5.3 mEq/L   Chloride 97  96 - 112 mEq/L   CO2 22  19 - 32 mEq/L   Glucose,  Bld 194 (*) 70 - 99 mg/dL   BUN 6  6 - 23 mg/dL   Creatinine, Ser 0.69  0.50 - 1.35 mg/dL   Calcium 8.2 (*) 8.4 - 10.5 mg/dL   GFR calc non Af Amer >90  >90 mL/min   GFR calc Af Amer >90  >90 mL/min  GLUCOSE, CAPILLARY      Result Value Ref Range   Glucose-Capillary 181 (*) 70 - 99 mg/dL   Comment 1 Notify RN    GLUCOSE, CAPILLARY      Result Value Ref Range   Glucose-Capillary 179 (*) 70 - 99 mg/dL   Comment 1 Documented in Chart     Comment 2 Notify RN    GLUCOSE, CAPILLARY      Result Value Ref Range   Glucose-Capillary 175 (*) 70 - 99 mg/dL  GLUCOSE, CAPILLARY      Result Value  Ref Range   Glucose-Capillary 175 (*) 70 - 99 mg/dL  POCT I-STAT TROPONIN I      Result Value Ref Range   Troponin i, poc 0.01  0.00 - 0.08 ng/mL   Comment 3           TYPE AND SCREEN      Result Value Ref Range   ABO/RH(D) A POS     Antibody Screen NEG     Sample Expiration 01/15/2014    ABO/RH      Result Value Ref Range   ABO/RH(D) A POS     A/P:  Pt with history of stage IIIA NSC lung carcinoma and recent large volume hemoptysis. Pt currently under care of Dr. [name] for chemotherapy and completed radiotherapy to right lung in 11/2013. Bronchoscopy on 01/15/14 revealed RUL posterior segment complete occlusion with necrotic lesion (probable source of recurrent hemoptysis). Hemoptysis has tapered over last 24 hours. Hgb 10.6. Details/risks of bronchial artery embolization were d/w pt/family with their understanding and consent. If volume of hemoptysis increases will tent plan study for 2/13. Otherwise, we will cont to monitor for any acute changes and await onc/rad onc input.

## 2014-01-15 NOTE — Consult Note (Signed)
PULMONARY  / CRITICAL CARE MEDICINE  Name: David [name] MRN: [id number] DOB: Sep 02, 1941 PCP [name], MD    ADMISSION DATE:  01/12/2014  LOS 3 days   CONSULTATION DATE:  01/15/2014   REFERRING MD :  Dr Verneita Griffes PRIMARY SERVICE: [facility]  CHIEF COMPLAINT:  Hemoptysis v epistaxis  BRIEF PATIENT DESCRIPTION: Hemoptysius v epistaxis in stage 3A lung cancer patient  SIGNIFICANT EVENTS / STUDIES:  01/12/2014 - admit   LINES / TUBES: none  CULTURES: Results for orders placed during the hospital encounter of 01/12/14  CLOSTRIDIUM DIFFICILE BY PCR     Status: None   Collection Time    01/13/14  1:43 PM      Result Value Ref Range Status   C difficile by pcr NEGATIVE  NEGATIVE Final   Comment: Performed at [facility] Anti-infectives   Start     Dose/Rate Route Frequency Ordered Stop   01/13/14 1600  levofloxacin (LEVAQUIN) IVPB 750 mg     750 mg 100 mL/hr over 90 Minutes Intravenous Every 24 hours 01/12/14 2037     01/12/14 1500  levofloxacin (LEVAQUIN) IVPB 750 mg     750 mg 100 mL/hr over 90 Minutes Intravenous  Once 01/12/14 1459 01/12/14 1648   01/12/14 0000  levofloxacin (LEVAQUIN) 500 MG tablet     500 mg Oral Daily 01/12/14 1551         HISTORY OF PRESENT ILLNESS:  73 year old gentleman with COPD and otherwise specified and undiagnosed UIP on CT chest with stage IIIA non-small cell lung cancer the care of Dr. [name]. Status post radiation therapy to right chest October 2014 through December 2014 and ongoing chemotherapy  Of note, 09/08/2013 he underwent bronchoscopy with endobronchial ultrasound for the diagnosis of non-small cell lung cancer. It was noted that he had a R3 segmental bronchus and with endobronchial lesion. He biopsy of the station 7 and 4 or lymph nodes. It must be noted that the endobronchial biopsyR3  endobronchial lesion was nondiagnostic and it was EBUS  produced positive results   A few weeks ago  started having epistaxis due to the cold weather but a few days later this progress to his chest and he started having hemoptysis but no further epistaxis. However, he is uncertain if this is epistaxis that drains into his chest or this is primary hemoptysis. Hemoptysis is daily and has progressively gotten worse. He only coughs one or 2 times a day and each time it's a big glob of blood. Certainly it is not massive or submassive hemoptysis . He otherwise feels well without any sputum production, fever, edema, chest pain, weight loss or associated other symptoms. Hemoptysis is not associated with anti-coagulation  . CT hest at admission so stable right hilar mass comapred to earlier in mid-jan 2015   SUBJECTIVE:    2/12/15_ bronch yesterday RUL posterior segment complete occlusion with necrotic lesion. Spoke to [facility] - Dr ? [name] - feels too distal for Interentional bronch and recommended repeat exertnal XRT. Patient now giving confusing history: says 2 days prior to admission was couhing a cup full of blood (152mL) and on day prior to bornch > 235mL (per tissue estimates). Post bronch has coughed only 30Ml till 8am today. Since 8am no hemoptysis  VITAL SIGNS: Filed Vitals:   01/14/14 2317 01/15/14 0100 01/15/14 0559 01/15/14 1109  BP: 140/80  106/81 124/77  Pulse:   106 110  Temp:   98.1 F (36.7 C) 98  F (36.7 C)  TempSrc:   Oral Oral  Resp:  24 24 23   Height:      Weight:   89.9 kg (198 lb 3.1 oz)   SpO2:   99% 96%      HEMODYNAMICS:   VENTILATOR SETTINGS: Vent Mode:  [-] PRVC FiO2 (%):  [30 %] 30 % Set Rate:  [28 bmp] 28 bmp Vt Set:  [420 mL] 420 mL PEEP:  [5 cmH20] 5 cmH20 Plateau Pressure:  [4 MVE72-09 cmH20] 20 cmH20 INTAKE / OUTPUT: I/O last 3 completed shifts: In: 2386.3 [P.O.:240; I.V.:2146.3] Out: 1635 [Urine:1635]     PHYSICAL EXAMINATION: General:  Alert and oriented x3. Obese, looks well Neuro:  Alert and oriented x3. Glasgow Coma Scale 15. CAM-ICU negative  for delirium. Moves all fours. HEENT:  Mallampati class II. He is irritated in both nostrils medial aspect in the Little's area suggesting of epistaxis Cardiovascular:  S1-S2 present. No murmurs Lungs:  Mild barrel chest. Overall air entry diminished . Mild crackles in the base. Abdomen:  Soft nontender no organomegaly Musculoskeletal:  No cyanosis, no clubbing no edema Skin:  Intact  LABS: PULMONARY No results found for this basename: PHART, PCO2, PCO2ART, PO2, PO2ART, HCO3, TCO2, O2SAT,  in the last 168 hours  CBC  Recent Labs Lab 01/12/14 1345  01/14/14 1420 01/14/14 1955 01/15/14 0420  HGB 12.9*  < > 11.4* 11.3* 10.6*  HCT 38.3*  < > 34.1* 32.3* 32.1*  WBC 11.1*  --   --   --  12.7*  PLT 240  --   --   --  269  < > = values in this interval not displayed.  COAGULATION No results found for this basename: INR,  in the last 168 hours  CARDIAC  No results found for this basename: TROPONINI,  in the last 168 hours  Recent Labs Lab 01/12/14 1345  PROBNP 204.4*     CHEMISTRY  Recent Labs Lab 01/12/14 1345 01/13/14 0345 01/15/14 0420  NA 135* 129* 132*  K 4.4 3.5* 3.6*  CL 94* 92* 97  CO2 24 23 22   GLUCOSE 223* 224* 194*  BUN 10 8 6   CREATININE 0.89 0.78 0.69  CALCIUM 9.1 8.0* 8.2*   Estimated Creatinine Clearance: 92.6 ml/min (by C-G formula based on Cr of 0.69).   LIVER No results found for this basename: AST, ALT, ALKPHOS, BILITOT, PROT, ALBUMIN, INR,  in the last 168 hours   INFECTIOUS No results found for this basename: LATICACIDVEN, PROCALCITON,  in the last 168 hours   ENDOCRINE CBG (last 3)   Recent Labs  01/14/14 2234 01/15/14 0739 01/15/14 1147  GLUCAP 179* 175* 175*         IMAGING x48h  No results found.     ASSESSMENT / PLAN:  PULMONARY A:Hemoptysis due to RUL posterior segment mass lesion NSCLC following XRT and Chemo and post chemo mass erduction. Mass also next to right pulmonary artery.   PLAN  - await rad onc  opinion  - continue to collect hemoptysis in measuring cup - if large volume need IR guided embolization - dw Dr Pascal Lux on call yesterday  - await Dr [name] input as well - keep in hospital  Wife, dauighter and patient updated that if worsens could be a life threatening situation    Dr. Brand Males, M.D., [facility].C.P Pulmonary and Critical Care Medicine Staff Physician [facility] Pager: [phone], If no answer or between  15:00h -  7:00h: call [phone]  01/15/2014 12:27 PM

## 2014-01-16 ENCOUNTER — Inpatient Hospital Stay (HOSPITAL_COMMUNITY): Payer: Medicare HMO

## 2014-01-16 ENCOUNTER — Ambulatory Visit: Payer: Medicare HMO | Admitting: Radiation Oncology

## 2014-01-16 ENCOUNTER — Ambulatory Visit (HOSPITAL_COMMUNITY): Payer: Medicare HMO

## 2014-01-16 LAB — BASIC METABOLIC PANEL
BUN: 6 mg/dL (ref 6–23)
CALCIUM: 8.2 mg/dL — AB (ref 8.4–10.5)
CO2: 22 meq/L (ref 19–32)
Chloride: 96 mEq/L (ref 96–112)
Creatinine, Ser: 0.66 mg/dL (ref 0.50–1.35)
GFR calc Af Amer: >90 mL/min (ref >90)
GFR calc non Af Amer: >90 mL/min (ref >90)
Glucose, Bld: 160 mg/dL — ABNORMAL HIGH (ref 70–99)
POTASSIUM: 3.4 meq/L — AB (ref 3.7–5.3)
SODIUM: 132 meq/L — AB (ref 137–147)

## 2014-01-16 LAB — CBC
HEMATOCRIT: 31 % — AB (ref 39.0–52.0)
Hemoglobin: 10.7 g/dL — ABNORMAL LOW (ref 13.0–17.0)
MCH: 31.7 pg (ref 26.0–34.0)
MCHC: 34.5 g/dL (ref 30.0–36.0)
MCV: 91.7 fL (ref 78.0–100.0)
PLATELETS: 285 10*3/uL (ref 150–400)
RBC: 3.38 MIL/uL — AB (ref 4.22–5.81)
RDW: 13.1 % (ref 11.5–15.5)
WBC: 10 10*3/uL (ref 4.0–10.5)

## 2014-01-16 LAB — GLUCOSE, CAPILLARY
GLUCOSE-CAPILLARY: 103 mg/dL — AB (ref 70–99)
GLUCOSE-CAPILLARY: 151 mg/dL — AB (ref 70–99)
Glucose-Capillary: 145 mg/dL — ABNORMAL HIGH (ref 70–99)
Glucose-Capillary: 125 mg/dL — ABNORMAL HIGH (ref 70–99)

## 2014-01-16 LAB — APTT: APTT: 44 s — AB (ref 24–37)

## 2014-01-16 LAB — PROTIME-INR
INR: 1.21 (ref 0.00–1.49)
Prothrombin Time: 15 seconds (ref 11.6–15.2)

## 2014-01-16 MED ORDER — LIDOCAINE HCL 1 % IJ SOLN
INTRAMUSCULAR | Status: AC
  Filled 2014-01-16: qty 20

## 2014-01-16 MED ORDER — MIDAZOLAM HCL 2 MG/2ML IJ SOLN
INTRAMUSCULAR | Status: AC | PRN
Start: 2014-01-16 — End: 2014-01-16
  Administered 2014-01-16 (×4): 0.5 mg via INTRAVENOUS

## 2014-01-16 MED ORDER — IOHEXOL 300 MG/ML  SOLN
20.0000 mL | Freq: Once | INTRAMUSCULAR | Status: AC | PRN
  Administered 2014-01-16: 50 mL

## 2014-01-16 MED ORDER — FENTANYL CITRATE 0.05 MG/ML IJ SOLN
INTRAMUSCULAR | Status: AC
  Filled 2014-01-16: qty 6

## 2014-01-16 MED ORDER — POTASSIUM CHLORIDE CRYS ER 20 MEQ PO TBCR
30.0000 meq | EXTENDED_RELEASE_TABLET | Freq: Once | ORAL | Status: AC
  Administered 2014-01-16: 30 meq via ORAL
  Filled 2014-01-16: qty 1

## 2014-01-16 MED ORDER — MIDAZOLAM HCL 2 MG/2ML IJ SOLN
INTRAMUSCULAR | Status: AC
  Filled 2014-01-16: qty 6

## 2014-01-16 MED ORDER — IOHEXOL 350 MG/ML SOLN
50.0000 mL | Freq: Once | INTRAVENOUS | Status: AC | PRN
  Administered 2014-01-16: 75 mL via INTRA_ARTERIAL

## 2014-01-16 MED ORDER — HYDROCODONE-HOMATROPINE 5-1.5 MG/5ML PO SYRP: 5.0000 mL | ORAL_SOLUTION | Freq: Four times a day (QID) | ORAL | Status: AC | PRN

## 2014-01-16 MED ORDER — FENTANYL CITRATE 0.05 MG/ML IJ SOLN
INTRAMUSCULAR | Status: AC | PRN
  Administered 2014-01-16 (×3): 25 ug via INTRAVENOUS

## 2014-01-16 NOTE — Progress Notes (Signed)
PROGRESS NOTE  David [name] JQZ:[id number] DOB: 1941-03-22 DOA: 01/12/2014 PCP: [name], MD  Assessment/Plan:   Hemoptysis  - recurrent episode 2/11, seems to have slowed down and no further hemoptysis episodes overnight. IR to plan IR study with embolization today. Appreciate coordination of care between Onc/RadOnc/IR/PCCM.  Diarrhea - C diff negative. Stage III Lung Ca - Oncology appreciated. Has had XRT Pulm Fibrosis-see above Mild-mod COPD per OV 07/30/06-Albuterol worsened hemoptysis-pt may hold this if he doesn't like it CAD, s/p CABG 2002-Antiplt/Anticoag on hold for now given 1. Cont Toprol 25 qhs Htn-Cont Amlodipine 5 daily, Valsartan 160 daily H/o adenomatous polpy 2012 Gerd-On protonix Hld-cont zocor 40 daily   Code Status: Full  Family Communication: none present  Disposition Plan: Per Pulm and Work-up  Diet: heart Fluids: none DVT Prophylaxis: scd  Code Status: Full Family Communication: none  Disposition Plan: inpatient  Consultants:  Pulmonology  ENT  IR  Oncology  Radiation oncology  Procedures:  bronch   Antibiotics  Anti-infectives   Start     Dose/Rate Route Frequency Ordered Stop   01/13/14 1600  levofloxacin (LEVAQUIN) IVPB 750 mg     750 mg 100 mL/hr over 90 Minutes Intravenous Every 24 hours 01/12/14 2037     01/12/14 1500  levofloxacin (LEVAQUIN) IVPB 750 mg     750 mg 100 mL/hr over 90 Minutes Intravenous  Once 01/12/14 1459 01/12/14 1648   01/12/14 0000  levofloxacin (LEVAQUIN) 500 MG tablet     500 mg Oral Daily 01/12/14 1551       Antibiotics Given (last 72 hours)   Date/Time Action Medication Dose Rate   01/13/14 1633 Given   levofloxacin (LEVAQUIN) IVPB 750 mg 750 mg 100 mL/hr   01/14/14 1602 Given   levofloxacin (LEVAQUIN) IVPB 750 mg 750 mg 100 mL/hr   01/15/14 1624 Given   levofloxacin (LEVAQUIN) IVPB 750 mg 750 mg 100 mL/hr      HPI/Subjective: - no complaints  Objective: Filed Vitals:   01/15/14 1109 01/15/14 1417 01/15/14 2056 01/16/14 0635  BP: 124/77 124/73 129/73 140/87  Pulse: 110 116 125 114  Temp: 98 F (36.7 C)  99 F (37.2 C) 98.5 F (36.9 C)  TempSrc: Oral Oral Oral Oral  Resp: 23 20 20 20   Height:      Weight:    90.5 kg (199 lb 8.3 oz)  SpO2: 96% 97% 95% 96%    Intake/Output Summary (Last 24 hours) at 01/16/14 1107 Last data filed at 01/16/14 0640  Gross per 24 hour  Intake   1530 ml  Output   1250 ml  Net    280 ml   Filed Weights   01/14/14 0540 01/15/14 0559 01/16/14 0635  Weight: 89.858 kg (198 lb 1.6 oz) 89.9 kg (198 lb 3.1 oz) 90.5 kg (199 lb 8.3 oz)    Exam:   General:  NAD  Cardiovascular: regular rate and rhythm, without MRG  Respiratory: good air movement, no wheezing, ronchi or rales  Abdomen: soft, not tender to palpation, positive bowel sounds  MSK: no peripheral edema  Neuro: non focal  Data Reviewed: Basic Metabolic Panel:  Recent Labs Lab 01/12/14 1345 01/13/14 0345 01/15/14 0420 01/16/14 0407  NA 135* 129* 132* 132*  K 4.4 3.5* 3.6* 3.4*  CL 94* 92* 97 96  CO2 24 23 22 22   GLUCOSE 223* 224* 194* 160*  BUN 10 8 6 6   CREATININE 0.89 0.78 0.69 0.66  CALCIUM 9.1 8.0* 8.2* 8.2*  CBC:  Recent Labs Lab 01/12/14 1345  01/14/14 0350 01/14/14 1420 01/14/14 1955 01/15/14 0420 01/16/14 0407  WBC 11.1*  --   --   --   --  12.7* 10.0  HGB 12.9*  < > 10.9* 11.4* 11.3* 10.6* 10.7*  HCT 38.3*  < > 32.1* 34.1* 32.3* 32.1* 31.0*  MCV 93.9  --   --   --   --  92.0 91.7  PLT 240  --   --   --   --  269 285  < > = values in this interval not displayed.  BNP (last 3 results)  Recent Labs  01/12/14 1345  PROBNP 204.4*   CBG:  Recent Labs Lab 01/15/14 0739 01/15/14 1147 01/15/14 1717 01/15/14 2058 01/16/14 0809  GLUCAP 175* 175* 177* 186* 145*    Recent Results (from the past 240 hour(s))  CLOSTRIDIUM DIFFICILE BY PCR     Status: None   Collection Time    01/13/14  1:43 PM      Result Value Ref  Range Status   C difficile by pcr NEGATIVE  NEGATIVE Final   Comment: Performed at [facility]     Studies: No results found.  Scheduled Meds: . amLODipine  5 mg Oral Daily   And  . valsartan  160 mg Oral Daily  . insulin aspart  0-15 Units Subcutaneous TID WC  . levofloxacin (LEVAQUIN) IV  750 mg Intravenous Q24H  . linagliptin  5 mg Oral Daily  . metoprolol succinate  25 mg Oral QHS  . mupirocin ointment   Nasal TID  . oxymetazoline  3 spray Each Nare TID  . pantoprazole  40 mg Oral Daily  . simvastatin  10 mg Oral q1800   Continuous Infusions: . sodium chloride 75 mL/hr at 01/16/14 0403    Principal Problem:   Hemoptysis Active Problems:   HYPERLIPIDEMIA-MIXED   HYPERTENSION, BENIGN   Malignant neoplasm of upper lobe, bronchus or lung   Hypoxia   CAP (community acquired pneumonia)  Time spent: [place], MD [facility] Pager [phone]. If 7 PM - 7 AM, please contact night-coverage at [email or web address], password [facility] 01/16/2014, 11:07 AM  LOS: 4 days

## 2014-01-16 NOTE — Progress Notes (Signed)
Spoke to pt's wife who selected [place] for Home O2. Oxygen ordered from [place] [phone], information faxed to [place] at [phone].

## 2014-01-16 NOTE — Care Management (Signed)
SATURATION QUALIFICATIONS: (This note is used to comply with regulatory documentation for home oxygen)  Patient Saturations on Room Air at Rest = 90%  Patient Saturations on Room Air while Ambulating = 86-88%  Patient Saturations on 2 Liters of oxygen while Ambulating = 92%  Please briefly explain why patient needs home oxygen:

## 2014-01-16 NOTE — Progress Notes (Addendum)
Subjective: Pt feeling well this am. States he has not had any further hemoptysis for at least the last 36hrs. He reports he continues to cough but blood or even blood tinged sputum noted.  Objective: Physical Exam: BP 140/87  Pulse 114  Temp(Src) 98.5 F (36.9 C) (Oral)  Resp 20  Ht 5\' 9"  (1.753 m)  Wt 199 lb 8.3 oz (90.5 kg)  BMI 29.45 kg/m2  SpO2 96%   Labs: CBC  Recent Labs  01/15/14 0420 01/16/14 0407  WBC 12.7* 10.0  HGB 10.6* 10.7*  HCT 32.1* 31.0*  PLT 269 285   BMET  Recent Labs  01/15/14 0420 01/16/14 0407  NA 132* 132*  K 3.6* 3.4*  CL 97 96  CO2 22 22  GLUCOSE 194* 160*  BUN 6 6  CREATININE 0.69 0.66  CALCIUM 8.2* 8.2*   LFT No results found for this basename: PROT, ALBUMIN, AST, ALT, ALKPHOS, BILITOT, BILIDIR, IBILI, LIPASE,  in the last 72 hours PT/INR  Recent Labs  01/16/14 0407  LABPROT 15.0  INR 1.21      Assessment/Plan: Stage IIIA NSC lung carcinoma and recent large volume hemoptysis. Currently, hemoptysis has ceased Hgb stable 10.7 D/w Dr. Kathlene Cote, IR will defer angio/embolization for now. Can proceed with plan for 4D treatment planning CT with Radiation Oncology as scheduled today. We will continue to follow his course.    LOS: 4 days    Ascencion Dike PA-C 01/16/2014 8:22 AM

## 2014-01-16 NOTE — Progress Notes (Signed)
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 [name]. Status post radiation therapy to right chest October 2014 through December 2014 and ongoing chemotherapy  Of note, 09/08/2013 he underwent bronchoscopy with endobronchial ultrasound for the diagnosis of non-small cell lung cancer. It was noted that he had a R3 segmental bronchus and with endobronchial lesion. He biopsy of the station 7 and 4 or lymph nodes. It must be noted that the endobronchial biopsyR3  endobronchial lesion was nondiagnostic and it was EBUS  produced positive results   A few weeks ago started having epistaxis due to the cold weather but a few days later this progress to his chest and he started having hemoptysis but no further epistaxis. However, he is uncertain if this is epistaxis that drains into his chest or this is primary hemoptysis. Hemoptysis is daily and has progressively gotten worse. He only coughs one or 2 times a day and each time it's a big glob of blood. Certainly it is not massive or submassive hemoptysis . He otherwise feels well without any sputum production, fever, edema, chest pain, weight loss or associated other symptoms. Hemoptysis is not associated with anti-coagulation  . CT hest at admission so stable right hilar mass comapred to earlier in mid-jan 2015   [place] / STUDIES:  01/12/2014 - admit   LINES / TUBES: none  CULTURES: Results for orders placed during the hospital encounter of 01/12/14  CLOSTRIDIUM DIFFICILE BY PCR     Status: None   Collection  Time    01/13/14  1:43 PM      Result Value Ref Range Status   C difficile by pcr NEGATIVE  NEGATIVE Final   Comment: Performed at [place]: Anti-infectives   Start     Dose/Rate Route Frequency Ordered Stop   01/13/14 1600  levofloxacin (LEVAQUIN) IVPB 750 mg     750 mg 100 mL/hr over 90 Minutes Intravenous Every 24 hours 01/12/14 2037     01/12/14 1500  levofloxacin (LEVAQUIN) IVPB 750 mg     750 mg 100 mL/hr over 90 Minutes Intravenous  Once 01/12/14 1459 01/12/14 1648   01/12/14 0000  levofloxacin (LEVAQUIN) 500 MG tablet     500 mg Oral Daily 01/12/14 1551          EVENTS   2/12/15_ bronch yesterday RUL posterior segment complete occlusion with necrotic lesion. Spoke to [facility] - Dr ? [name] - feels too distal for Interentional bronch and recommended repeat exertnal XRT. Patient now giving confusing history: says 2 days prior to admission was couhing a cup full of blood (122mL) and on day prior to bornch > 266mL (per tissue estimates). Post bronch has coughed only 30Ml till 8am today. Since 8am no hemoptysis    SUBJECTIVE/OVERNIGHT/INTERVAL HX 2.13/15: No hemoptysis x 24h  VITAL SIGNS: Filed Vitals:   01/15/14 1109 01/15/14 1417 01/15/14 2056 01/16/14 0635  BP: 124/77 124/73 129/73 140/87  Pulse: 110 116 125 114  Temp: 98 F (36.7 C)  99 F (37.2 C) 98.5  F (36.9 C)  TempSrc: Oral Oral Oral Oral  Resp: 23 20 20 20   Height:      Weight:    90.5 kg (199 lb 8.3 oz)  SpO2: 96% 97% 95% 96%    INTAKE / OUTPUT: I/O last 3 completed shifts: In: 2416.3 [P.O.:720; I.V.:1546.3; IV Piggyback:150] Out: 2890 [Urine:2890]     PHYSICAL EXAMINATION: General:  Alert and oriented x3. Obese, looks well Neuro:  Alert and oriented x3. Glasgow Coma Scale 15. CAM-ICU negative for delirium. Moves all fours. HEENT:  Mallampati class II.  Cardiovascular:  S1-S2 present. No murmurs Lungs:  Mild barrel chest. Overall air entry diminished . Mild crackles  in the base. Abdomen:  Soft nontender no organomegaly Musculoskeletal:  No cyanosis, no clubbing no edema Skin:  Intact  LABS: PULMONARY No results found for this basename: PHART, PCO2, PCO2ART, PO2, PO2ART, HCO3, TCO2, O2SAT,  in the last 168 hours  CBC  Recent Labs Lab 01/12/14 1345  01/14/14 1955 01/15/14 0420 01/16/14 0407  HGB 12.9*  < > 11.3* 10.6* 10.7*  HCT 38.3*  < > 32.3* 32.1* 31.0*  WBC 11.1*  --   --  12.7* 10.0  PLT 240  --   --  269 285  < > = values in this interval not displayed.  COAGULATION  Recent Labs Lab 01/16/14 0407  INR 1.21    CARDIAC  No results found for this basename: TROPONINI,  in the last 168 hours  Recent Labs Lab 01/12/14 1345  PROBNP 204.4*     CHEMISTRY  Recent Labs Lab 01/12/14 1345 01/13/14 0345 01/15/14 0420 01/16/14 0407  NA 135* 129* 132* 132*  K 4.4 3.5* 3.6* 3.4*  CL 94* 92* 97 96  CO2 24 23 22 22   GLUCOSE 223* 224* 194* 160*  BUN 10 8 6 6   CREATININE 0.89 0.78 0.69 0.66  CALCIUM 9.1 8.0* 8.2* 8.2*   Estimated Creatinine Clearance: 92.8 ml/min (by C-G formula based on Cr of 0.66).   LIVER  Recent Labs Lab 01/16/14 0407  INR 1.21     INFECTIOUS No results found for this basename: LATICACIDVEN, PROCALCITON,  in the last 168 hours   ENDOCRINE CBG (last 3)   Recent Labs  01/15/14 1717 01/15/14 2058 01/16/14 0809  GLUCAP 177* 186* 145*         IMAGING x48h  No results found.     ASSESSMENT / PLAN:  PULMONARY A:Hemoptysis due to RUL posterior segment mass lesion NSCLC following XRT and Chemo and post chemo mass erduction. Mass also next to right pulmonary artery.   01/16/14: No hemoptysis x 24h  PLAN  - d/w Dr Pablo Ledger of rad onc: there is high risk for repeat XRT complications from fibrosis, to repeat hemoptysis. Therefore, feel IR attempt at embolization should be first choice. I have conveyed this to Ascencion Dike PA of IR and Dr Damien Fusi of [facility].   PCCM will  follow  Dr. Brand Males, M.D., [facility].C.P Pulmonary and Critical Care Medicine Staff Physician [place] [facility] Pager: [phone], If no answer or between  15:00h - 7:00h: call [phone]  01/16/2014 9:46 AM

## 2014-01-16 NOTE — Progress Notes (Signed)
Gave patient and family handouts on "oxygen use at home" and "using oxygen safely."

## 2014-01-16 NOTE — Progress Notes (Signed)
Agree.  If continues to have hemoptysis, would benefit from bronchial arteriography with possible embolization of right bronchial supply.  Will check today if he is continuing to have hemoptysis.

## 2014-01-16 NOTE — Progress Notes (Addendum)
[facility]  [facility] is providing the following services: Oxygen order will be sent to [facility] per patient's choice.  If patient discharges after hours, please call [phone].   Linward Headland 01/16/2014, 1:24 PM

## 2014-01-16 NOTE — Procedures (Signed)
Procedure:  Thoracic aortography, bronchial arteriography, bronchial artery embolization Access:  Right CFA, 5 Fr sheath Findings:  Large right bronchial artery trunk supplying both upper and lower lung zones.  Embolized with one vial of 300-500 micron Embosphere particles. Accessory right bronchial trunk supplying central lung could not be catheterized due to small size and tortuosity. Plan:  4 hr bedrest.  Follow for further hemoptysis.

## 2014-01-16 NOTE — Progress Notes (Addendum)
No hemoptysis at present time, but there still may be value in embolization even if not bleeding. Dr. Kathlene Cote has d/w PCCM and feels prophylactic embolization is still warranted. Long d/w pt and family regarding procedure, risks, complications, use of sedation. Consent reviewed. Decision to move forward with case d/w RN and attending MD, Dr. Cruzita Lederer. Tentative plan for later this afternoon.  Ascencion Dike PA-C Interventional Radiology 01/16/2014 10:13 AM

## 2014-01-17 DIAGNOSIS — J449 Chronic obstructive pulmonary disease, unspecified: Secondary | ICD-10-CM

## 2014-01-17 LAB — GLUCOSE, CAPILLARY
Glucose-Capillary: 177 mg/dL — ABNORMAL HIGH (ref 70–99)
Glucose-Capillary: 204 mg/dL — ABNORMAL HIGH (ref 70–99)

## 2014-01-17 MED ORDER — BUDESONIDE-FORMOTEROL FUMARATE 160-4.5 MCG/ACT IN AERO
2.0000 | INHALATION_SPRAY | Freq: Two times a day (BID) | RESPIRATORY_TRACT | Status: DC
  Administered 2014-01-17: 2 via RESPIRATORY_TRACT
  Filled 2014-01-17: qty 6

## 2014-01-17 MED ORDER — METOPROLOL SUCCINATE ER 25 MG PO TB24: 50.0000 mg | ORAL_TABLET | Freq: Every day | ORAL | Status: AC

## 2014-01-17 MED ORDER — BUDESONIDE-FORMOTEROL FUMARATE 160-4.5 MCG/ACT IN AERO: 2.0000 | INHALATION_SPRAY | Freq: Two times a day (BID) | RESPIRATORY_TRACT | Status: AC

## 2014-01-17 MED ORDER — METOPROLOL TARTRATE 25 MG PO TABS
25.0000 mg | ORAL_TABLET | Freq: Two times a day (BID) | ORAL | Status: DC
  Administered 2014-01-17: 25 mg via ORAL
  Filled 2014-01-17 (×2): qty 1

## 2014-01-17 NOTE — Progress Notes (Signed)
01/17/2014 1230 NCM faxed orders for shower stool and 3n1 to [employer]. [name] will deliver both when they deliver oxygen.  Wife states she does not know which will work in her shower. Jonnie Finner RN CCM Case Mgmt phone [phone]

## 2014-01-17 NOTE — Progress Notes (Signed)
Subjective: Patient with previous complaints of hemoptysis is s/p thoracic aortography, bronchial arteriography, and bronchial artery embolization 01/16/14. Accessory right bronchial trunk supplying central lung could not be catheterized due to small size and tortuosity. The patient states his symptoms are much improved. He did have some blood tinged sputum approximately every 8th cough last night, no blood tinged or hemoptysis since last evening. He denies any lightheadedness/dizziness. He denies any right groin pain or swelling.   Objective: Physical Exam: BP 134/75  Pulse 124  Temp(Src) 98.7 F (37.1 C) (Oral)  Resp 18  Ht 5\' 9"  (1.753 m)  Wt 199 lb 8.3 oz (90.5 kg)  BMI 29.45 kg/m2  SpO2 98%  General: A&Ox3, NAD, sitting in bed Heart: RRR without M/G/R Lungs: CTA bilaterally Ext: right femoral artery access site dressing C/D/I, soft, no signs of bleeding or hematoma, DP intact bilaterally 1+  Labs: CBC  Recent Labs  01/15/14 0420 01/16/14 0407  WBC 12.7* 10.0  HGB 10.6* 10.7*  HCT 32.1* 31.0*  PLT 269 285   BMET  Recent Labs  01/15/14 0420 01/16/14 0407  NA 132* 132*  K 3.6* 3.4*  CL 97 96  CO2 22 22  GLUCOSE 194* 160*  BUN 6 6  CREATININE 0.69 0.66  CALCIUM 8.2* 8.2*   LFT No results found for this basename: PROT, ALBUMIN, AST, ALT, ALKPHOS, BILITOT, BILIDIR, IBILI, LIPASE,  in the last 72 hours PT/INR  Recent Labs  01/16/14 0407  LABPROT 15.0  INR 1.21    Studies/Results: No results found.  Assessment/Plan: Lung cancer with large volume hemoptysis s/p bronchial artery embolization 01/16/14. Hemoptysis improved, minimal blood tinged sputum last evening only. Vital signs stable. Dr. Kathlene Cote made aware.    LOS: 5 days    Rockney Ghee 01/17/2014 8:48 AM

## 2014-01-17 NOTE — Progress Notes (Addendum)
PULMONARY  / CRITICAL CARE MEDICINE  Name: David [name] MRN: [id number] DOB: 04-26-1941 PCP [name], MD    ADMISSION DATE:  01/12/2014  LOS 5 days    REFERRING MD :  Dr Verneita Griffes PRIMARY SERVICE: [place]:  Hemoptysis v epistaxis  BRIEF PATIENT DESCRIPTION:   73 year old gentleman with COPD and otherwise specified and undiagnosed UIP on CT chest with stage IIIA non-small cell lung cancer the care of Dr. [name]. Status post radiation therapy to right chest October 2014 through December 2014 and ongoing chemotherapy admitted with hemoptysis    SIGNIFICANT EVENTS / STUDIES:  01/12/2014 - admit 2/11 >  FOB with complete obst RUL post segment oozing mass 2/13  >  Bronchial artery embolization per IR    SUBJECTIVE/OVERNIGHT/INTERVAL HX No hemoptysis since procedure, still on 02, no cp  VITAL SIGNS: Filed Vitals:   01/16/14 1930 01/16/14 2030 01/16/14 2132 01/17/14 0518  BP: 151/69 142/76 142/75 134/75  Pulse: 122 118 119 124  Temp:   98.6 F (37 C) 98.7 F (37.1 C)  TempSrc:   Oral Oral  Resp: 20 20 20 18   Height:      Weight:      SpO2: 91% 94% 96% 98%    INTAKE / OUTPUT: I/O last 3 completed shifts: In: 2156 [P.O.:600; I.V.:1406; IV Piggyback:150] Out: 1700 [Urine:1550]     PHYSICAL EXAMINATION: General:  Alert and oriented x3. Obese, looks well Neuro:  Alert and oriented x3. Glasgow Coma Scale 15. CAM-ICU negative for delirium. Moves all fours. HEENT:  Mallampati class II.  Cardiovascular:  S1-S2 present. No murmurs Lungs:  Mild barrel chest. Overall air entry diminished . Mild crackles in the base. L > R exp rhonchi Abdomen:  Soft nontender no organomegaly Musculoskeletal:  No cyanosis, no clubbing no edema Skin:  Intact  LABS: PULMONARY No results found for this basename: PHART, PCO2, PCO2ART, PO2, PO2ART, HCO3, TCO2, O2SAT,  in the last 168 hours  CBC  Recent Labs Lab 01/12/14 1345  01/14/14 1955 01/15/14 0420  01/16/14 0407  HGB 12.9*  < > 11.3* 10.6* 10.7*  HCT 38.3*  < > 32.3* 32.1* 31.0*  WBC 11.1*  --   --  12.7* 10.0  PLT 240  --   --  269 285  < > = values in this interval not displayed.  COAGULATION  Recent Labs Lab 01/16/14 0407  INR 1.21    CARDIAC  No results found for this basename: TROPONINI,  in the last 168 hours  Recent Labs Lab 01/12/14 1345  PROBNP 204.4*     CHEMISTRY  Recent Labs Lab 01/12/14 1345 01/13/14 0345 01/15/14 0420 01/16/14 0407  NA 135* 129* 132* 132*  K 4.4 3.5* 3.6* 3.4*  CL 94* 92* 97 96  CO2 24 23 22 22   GLUCOSE 223* 224* 194* 160*  BUN 10 8 6 6   CREATININE 0.89 0.78 0.69 0.66  CALCIUM 9.1 8.0* 8.2* 8.2*   Estimated Creatinine Clearance: 92.8 ml/min (by C-G formula based on Cr of 0.66).   LIVER  Recent Labs Lab 01/16/14 0407  INR 1.21     INFECTIOUS No results found for this basename: LATICACIDVEN, PROCALCITON,  in the last 168 hours   ENDOCRINE CBG (last 3)   Recent Labs  01/16/14 1753 01/16/14 2214 01/17/14 0745  GLUCAP 103* 151* 177*         IMAGING x48h  No results found.     ASSESSMENT / PLAN:  PULMONARY A:Hemoptysis due  to RUL posterior segment mass lesion NSCLC following XRT and Chemo and post chemo mass reduction. Mass also next to right pulmonary artery.  - resolved p embolization 2/13   A.  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

## 2014-01-17 NOTE — Discharge Summary (Signed)
Physician Discharge Summary  David [name] EXN:[id number] DOB: 12-08-1940 DOA: 01/12/2014  PCP: David Grills, MD  Admit date: 01/12/2014 Discharge date: 01/17/2014  Time spent: 35 minutes  Recommendations for Outpatient Follow-up:  1. Follow up in Cancer center in 5 days as scheduled 2. Follow up with PCP in 1-2 weeks 3. Follow up with Pulmonology as outpatient as needed   Recommendations for primary care physician for things to follow:  Repeat CBC  Discharge Diagnoses:  Principal Problem:   Hemoptysis Active Problems:   HYPERLIPIDEMIA-MIXED   HYPERTENSION, BENIGN   Malignant neoplasm of upper lobe, bronchus or lung   Hypoxia   CAP (community acquired pneumonia)   COPD/ astmatic bronchitis  Discharge Condition: stable  Diet recommendation: heart healthy  Filed Weights   01/14/14 0540 01/15/14 0559 01/16/14 0635  Weight: 89.858 kg (198 lb 1.6 oz) 89.9 kg (198 lb 3.1 oz) 90.5 kg (199 lb 8.3 oz)   History of present illness:  73 year old male with past medical hx of stage IIIa non-small cell lung cancer received radiation therapy and chemotherapy about 6 weeks back, (currently started on consolidation chemotherapy 3 weeks back), hypertension, diabetes mellitus, CAD, hyperlipidemia presented with increased cough with hemoptysis since yesterday associated with increased shortness of breath. Patient reports increased cough with scanty hemoptysis since 3 weeks however since last evening he has noticed increased hemoptysis with coughing up of blood clots and increasingly getting short of breath with minimal activity. Patient denies headache, dizziness, fever, chills, nausea , vomiting, chest pain, palpitations, abdominal pain, bowel or urinary symptoms. Denies change in weight or appetite.  Hospital Course:  Hemoptysis - given uncertainty of presentation, ENT was consulted and ruled out upper source. Pulmonology was also consulted, and patient underwent a bronchoscopy on  01/14/2014 which showed an endobronchial lesion in the right upper lobe posterior segment occluding the entrance to the segment and believe to be the source of his hemoptysis. Consideration was given that the patient be transferred to [facility] for bronchoscopic laser therapy, however the location was not suitable for that. Interventional radiology was consulted for embolization, and the patient underwent thoracic aortography, bronchial arteriography, and bronchial artery embolization 01/16/14. His hemoptysis has slowed down even prior to embolization, and was completely resolved by the time of discharge. Oncology and radiation oncology also followed patient in the hospital, as radiation therapy to the lesion was being also considered and will be further addressed as an outpatient.  Diarrhea - C diff negative.  Stage III Lung Ca - To followup with oncology and radiation oncology as an outpatient  Pulm Fibrosis-see above  Mild-mod COPD  CAD, s/p CABG 2002-Antiplt/Anticoag on hold for now given 1. Cont Toprol Sinus tachycardia - patient with a heart rate in the low 100s, and this has been noticed to be not new for him per vital signs from previous clinic notes. I increased his Toprol to 50 mg daily. This can be followed up as an outpatient. He is asymptomatic  Htn-Cont Amlodipine 5 daily, Valsartan 160 daily  H/o adenomatous polpy 2012  Gerd-On protonix  Hld-cont zocor 40 daily   Procedures:  Bronchial artery embolization 01/16/14  Bronchoscopy 01/14/2014  Consultations:  Pulmonary  Interventional radiology  Oncology  Radiation oncology  Discharge Exam: Filed Vitals:   01/16/14 2132 01/17/14 0518 01/17/14 0900 01/17/14 1158  BP: 142/75 134/75 124/70   Pulse: 119 124 116   Temp: 98.6 F (37 C) 98.7 F (37.1 C) 97.3 F (36.3 C)   TempSrc: Oral Oral  Oral   Resp: 20 18 22    Height:      Weight:      SpO2: 96% 98% 95% 93%   General: NAD Cardiovascular: RRR Respiratory: CTA  biL  Discharge Instructions   Future Appointments Provider Department Dept Phone   01/22/2014 9:30 AM [facility] [phone]   01/22/2014 11:30 AM [name], MD [facility] [phone]   01/22/2014 12:30 PM [facility] [phone]   01/22/2014 1:45 PM Karie Mainland, [facility] [phone]   01/23/2014 10:30 AM [facility] [place] [place] [phone]   01/28/2014 11:00 AM [facility] [phone]   01/28/2014 11:30 AM Blair Promise, MD [facility] [phone]   01/29/2014 10:10 AM [facility] [phone]   02/05/2014 10:00 AM [facility] [phone]   02/12/2014 10:30 AM [facility] Only [facility] [phone]   02/12/2014 11:00 AM [facility] [phone]   02/13/2014 1:30 PM [place] [place] [phone]   03/16/2014 3:00 PM Blair Promise, MD [facility] [phone]   03/23/2014 9:30 AM [facility] Only [facility] [phone]   03/23/2014 10:30 AM [facility] [phone]   Liquids only 4 hours prior to your exam. Any medications can be taken as usual. Please arrive 15 min prior to your scheduled exam time.   03/30/2014 9:00 AM [name], MD [facility] [phone]       Medication List    STOP taking these medications       DELSYM 30 MG/5ML liquid  Generic drug:  dextromethorphan      TAKE these medications       amLODipine-valsartan 5-160 MG per tablet  Commonly known as:  EXFORGE  Take 1 tablet by mouth daily.      budesonide-formoterol 160-4.5 MCG/ACT inhaler  Commonly known as:  SYMBICORT  Inhale 2 puffs into the lungs 2 (two) times daily.     carboxymethylcellulose 0.5 % Soln  Commonly known as:  REFRESH PLUS  Place 1 drop into both eyes 3 (three) times daily as needed (dry eyes).     HYDROcodone-acetaminophen 5-325 MG per tablet  Commonly known as:  NORCO/VICODIN  Take 1 tablet by mouth every 6 (six) hours as needed for moderate pain.     HYDROcodone-homatropine 5-1.5 MG/5ML syrup  Commonly known as:  HYCODAN  Take 5 mLs by mouth every 6 (six) hours as needed for cough.     hydroxychloroquine 200 MG tablet  Commonly known as:  PLAQUENIL  Take 200 mg by mouth 2 (two) times daily.     levofloxacin 500 MG tablet  Commonly known as:  LEVAQUIN  Take 1 tablet (500 mg total) by mouth daily.     metFORMIN 500 MG tablet  Commonly known as:  GLUCOPHAGE  Take 500 mg by mouth 2 (two) times daily with a meal.     metoprolol succinate 25 MG 24 hr tablet  Commonly known as:  TOPROL-XL  Take 2 tablets (50 mg total) by mouth daily.     omeprazole 20 MG capsule  Commonly known as:  PRILOSEC  Take 20 mg by mouth  every morning.     pravastatin 20 MG tablet  Commonly known as:  PRAVACHOL  Take 20 mg by mouth daily.     PRESCRIPTION MEDICATION  CARBOplatin (PARAPLATIN) 570 mg in sodium chloride 0.9 % 250 mL chemo infusion 570 mg  Once 01/01/2014     PRESCRIPTION MEDICATION  PACLitaxel (TAXOL) 372 mg in dextrose 5 % 500 mL chemo infusion (> 80mg /m2) 372 mg  Once 01/01/2014     sitaGLIPtin 100 MG tablet  Commonly known as:  JANUVIA  Take 100 mg by mouth every morning.           Follow-up Information   Follow up with Eilleen Kempf., MD. Call today.   Specialty:  Oncology   Contact information:   [address] [place] [address] [phone]       The results of significant diagnostics from this hospitalization (including imaging, microbiology, ancillary and laboratory) are  listed below for reference.    Significant Diagnostic Studies: Ct Chest W Contrast  12/19/2013   CLINICAL DATA:  Lung cancer, chemotherapy complete.  Cough.  EXAM: CT CHEST WITH CONTRAST  TECHNIQUE: Multidetector CT imaging of the chest was performed during intravenous contrast administration.  CONTRAST:  44mL OMNIPAQUE IOHEXOL 300 MG/ML  SOLN  COMPARISON:  NM PET IMAGE INITIAL (PI) SKULL BASE TO THIGH dated 09/02/2013; CT CHEST W/CM dated 08/19/2013  FINDINGS: Mediastinal lymph nodes measure up to 8 mm in short axis in the lower right paratracheal station (previously 10 mm). Right hilar mass measures 2.0 x 3.1 cm (previously 3.4 x 4.8 cm). Soft tissue thickening extends along the right upper lobe bronchi. No left hilar or axillary adenopathy. Atherosclerotic calcification of the arterial vasculature. Heart size within normal limits. No pericardial effusion. There may be a tiny hiatal hernia.  Combination of mild centrilobular and paraseptal emphysema. A peripheral and basilar predominant pattern of subpleural reticulation, traction bronchiolectasis and mild architectural distortion appears progressive from 08/19/2013. A focal area of ground-glass is seen in the posterior segment right upper lobe with some involvement of the superior segment right lower lobe. No pleural fluid. There is narrowing of the posterior segmental bronchus to the right upper lobe. Airway is otherwise unremarkable.  Incidental imaging of the upper abdomen shows no acute findings. No worrisome lytic or sclerotic lesions. Degenerative changes are seen in the spine.  IMPRESSION: 1. Interval decrease in size of a right hilar mass and mediastinal lymph nodes with evolving changes of radiation therapy in the right upper and right lower lobes. 2. Pulmonary parenchymal pattern of fibrosis appears progressive from 08/19/2013. 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   Electronically Signed   By: Lorin Picket  M.D.   On: 12/19/2013 11:37   Ct Angio Chest Pe W/cm &/or Wo Cm  01/12/2014   CLINICAL DATA:  Shortness of breath and hemoptysis. Hypoxia. Lung cancer. Received radiation therapy within last 3 months.  EXAM: CT ANGIOGRAPHY CHEST WITH CONTRAST  TECHNIQUE: Multidetector CT imaging of the chest was performed using the standard protocol during bolus administration of intravenous contrast. Multiplanar CT image reconstructions and MIPs were obtained to evaluate the vascular anatomy.  CONTRAST:  173mL OMNIPAQUE IOHEXOL 350 MG/ML SOLN  COMPARISON:  CT CHEST W/CM dated 12/19/2013  FINDINGS: Respiratory motion somewhat degrades image quality. No definite pulmonary embolus. Right hilar adenopathy measures 2.5 x 3.1 cm, likely stable from 12/19/2013. Mediastinal lymph nodes are not enlarged by CT size criteria. No axillary adenopathy. Heart is at the upper  limits of normal in size. No pericardial effusion.  Mild centrilobular emphysema. There is a basilar distribution of subpleural reticulation, ground-glass, architectural distortion, and traction bronchiectasis/bronchiolectasis. Septal thickening appears increased from 12/19/2013. There are areas of patchy central parenchymal involvement, right greater than left. No pleural fluid. Narrowing of right upper lobe bronchi. Airway is otherwise unremarkable.  Incidental imaging of the upper at shows a sub cm low-attenuation lesion in the left hepatic lobe, as before. Visualized portions of the liver, adrenal glands, spleen and stomach are otherwise unremarkable, with exception of a tiny hiatal hernia. No worrisome lytic or sclerotic lesions. Degenerative changes are seen throughout spine. Remote median sternotomy.  Review of the MIP images confirms the above findings.  IMPRESSION: 1. Image quality is somewhat degraded by respiratory motion. No definite pulmonary embolus. 2. Right hilar mass is likely stable from 12/19/2013. 3. Patchy ground-glass and septal thickening may be due to  pulmonary edema and/or pulmonary hemorrhage. 4. 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   Electronically Signed   By: Lorin Picket M.D.   On: 01/12/2014 18:04   Ir Aorta/thoracic  01/17/2014   CLINICAL DATA:  History of right lung carcinoma and status post radiation therapy and chemotherapy. The patient has been admitted with intractable hemoptysis.  EXAM: 1. ULTRASOUND GUIDANCE FOR VASCULAR ACCESS OF THE RIGHT COMMON FEMORAL ARTERY 2. THORACIC AORTOGRAPHY 3. SELECTIVE CATHETERIZATION AND ANGIOGRAPHY OF RIGHT BRONCHIAL ARTERY TRUNK 4. TRANSCATHETER EMBOLIZATION OF RIGHT BRONCHIAL ARTERY  MEDICATIONS: Sedation:  2.0 mg IV Versed and 75 mcg IV fentanyl.  Total Moderate Sedation Time:  50 minutes.  CONTRAST:  86mL OMNIPAQUE IOHEXOL 350 MG/ML SOLN, 56mL OMNIPAQUE IOHEXOL 300 MG/ML SOLN  FLUOROSCOPY TIME:  40 min and 24 seconds.  PROCEDURE: Prior to the procedure, informed consent was obtained from the patient. Benefits and risks of the procedure were discussed in detail with the patient and his family. The patient was in agreement to proceed.  The right groin was prepped with Betadine and sterilely draped. Local anesthesia was provided by infiltration of 1% lidocaine.  Ultrasound was used to confirm patency of the right common femoral artery. Under direct ultrasound guidance, the artery was accessed with a micropuncture set. After advancing a guidewire in the artery, a 5 French vascular sheath was placed.  A 5 French pigtail catheter was advanced into the aorta. This is advanced to the level of the proximal descending thoracic aorta and thoracic aortography performed.  A 5 [place] Mickelson catheter was used to selectively catheterize a right bronchial artery trunk. Selective arteriography was performed. Micro catheter was then advanced into the bronchial trunk and additional selective  arteriography performed. Transcatheter embolization was then performed with instillation of 1 vial of 300 - 500 micron sized Embosphere particles in diluted contrast material. Additional angiography was then performed through the micro catheter.  Attempt was made to selectively catheterize a second right bronchial artery trunk with use of different 5 French catheters and multiple guidewires. Oblique arteriography was performed at the level of the femoral access site. Arteriotomy closure was performed with the Cordis ExoSeal device and manual compression.  Complications: None  FINDINGS: Descending thoracic aortogram shows one dominant right bronchial artery trunk emanating from the upper descending thoracic aorta. After catheterizing this artery selectively, arteriography demonstrates bronchial supply to both upper and lower lung zones. The vessel was successfully embolized with very slow and static flow present in the artery after embolization.  Additional injection shows a separate and smaller accessory bronchial trunk supplying both central right and left bronchial arteries. This artery was attempted to be catheterized to allow additional right-sided embolization. Due to small caliber of the artery as well as tortuosity at its origin, the vessel could not be successfully catheterized. No other definitive right bronchial artery supply was identified.  IMPRESSION: Arteriography demonstrates a dominant right bronchial artery trunk supplying both upper and lower lung zones. This trunk was successfully embolized with microsphere particle embolization. A smaller accessory right bronchial artery could not be selectively catheterized.   Electronically Signed   By: Aletta Edouard M.D.   On: 01/17/2014 13:39   Ir Angiogram Visceral Selective  01/17/2014   CLINICAL DATA:  History of right lung carcinoma and status post radiation therapy and chemotherapy. The patient has been admitted with intractable hemoptysis.  EXAM:  1. ULTRASOUND GUIDANCE FOR VASCULAR ACCESS OF THE RIGHT COMMON FEMORAL ARTERY 2. THORACIC AORTOGRAPHY 3. SELECTIVE CATHETERIZATION AND ANGIOGRAPHY OF RIGHT BRONCHIAL ARTERY TRUNK 4. TRANSCATHETER EMBOLIZATION OF RIGHT BRONCHIAL ARTERY  MEDICATIONS: Sedation:  2.0 mg IV Versed and 75 mcg IV fentanyl.  Total Moderate Sedation Time:  50 minutes.  CONTRAST:  40mL OMNIPAQUE IOHEXOL 350 MG/ML SOLN, 21mL OMNIPAQUE IOHEXOL 300 MG/ML SOLN  FLUOROSCOPY TIME:  40 min and 24 seconds.  PROCEDURE: Prior to the procedure, informed consent was obtained from the patient. Benefits and risks of the procedure were discussed in detail with the patient and his family. The patient was in agreement to proceed.  The right groin was prepped with Betadine and sterilely draped. Local anesthesia was provided by infiltration of 1% lidocaine.  Ultrasound was used to confirm patency of the right common femoral artery. Under direct ultrasound guidance, the artery was accessed with a micropuncture set. After advancing a guidewire in the artery, a 5 French vascular sheath was placed.  A 5 French pigtail catheter was advanced into the aorta. This is advanced to the level of the proximal descending thoracic aorta and thoracic aortography performed.  A 5 [place] Mickelson catheter was used to selectively catheterize a right bronchial artery trunk. Selective arteriography was performed. Micro catheter was then advanced into the bronchial trunk and additional selective arteriography performed. Transcatheter embolization was then performed with instillation of 1 vial of 300 - 500 micron sized Embosphere particles in diluted contrast material. Additional angiography was then performed through the micro catheter.  Attempt was made to selectively catheterize a second right bronchial artery trunk with use of different 5 French catheters and multiple guidewires. Oblique arteriography was performed at the level of the femoral access site. Arteriotomy closure was  performed with the Cordis ExoSeal device and manual compression.  Complications: None  FINDINGS: Descending thoracic aortogram shows one dominant right bronchial artery trunk emanating from the upper descending thoracic aorta. After catheterizing this artery selectively, arteriography demonstrates bronchial supply to both upper and lower lung zones. The vessel was successfully embolized with very slow and static flow present in the artery after embolization.  Additional injection shows a separate and smaller accessory bronchial trunk supplying both central right and left bronchial arteries. This artery was attempted to be catheterized to allow additional right-sided embolization. Due to small caliber of the artery as well as tortuosity at its origin, the vessel could not be successfully catheterized. No other definitive right bronchial artery supply was identified.  IMPRESSION: Arteriography demonstrates a dominant right bronchial artery trunk supplying both upper and lower lung zones. This trunk was successfully embolized with  microsphere particle embolization. A smaller accessory right bronchial artery could not be selectively catheterized.   Electronically Signed   By: Aletta Edouard M.D.   On: 01/17/2014 13:39   Ir US Guide Vasc Access Right  01/17/2014   CLINICAL DATA:  History of right lung carcinoma and status post radiation therapy and chemotherapy. The patient has been admitted with intractable hemoptysis.  EXAM: 1. ULTRASOUND GUIDANCE FOR VASCULAR ACCESS OF THE RIGHT COMMON FEMORAL ARTERY 2. THORACIC AORTOGRAPHY 3. SELECTIVE CATHETERIZATION AND ANGIOGRAPHY OF RIGHT BRONCHIAL ARTERY TRUNK 4. TRANSCATHETER EMBOLIZATION OF RIGHT BRONCHIAL ARTERY  MEDICATIONS: Sedation:  2.0 mg IV Versed and 75 mcg IV fentanyl.  Total Moderate Sedation Time:  50 minutes.  CONTRAST:  63mL OMNIPAQUE IOHEXOL 350 MG/ML SOLN, 18mL OMNIPAQUE IOHEXOL 300 MG/ML SOLN  FLUOROSCOPY TIME:  40 min and 24 seconds.  PROCEDURE: Prior to  the procedure, informed consent was obtained from the patient. Benefits and risks of the procedure were discussed in detail with the patient and his family. The patient was in agreement to proceed.  The right groin was prepped with Betadine and sterilely draped. Local anesthesia was provided by infiltration of 1% lidocaine.  Ultrasound was used to confirm patency of the right common femoral artery. Under direct ultrasound guidance, the artery was accessed with a micropuncture set. After advancing a guidewire in the artery, a 5 French vascular sheath was placed.  A 5 French pigtail catheter was advanced into the aorta. This is advanced to the level of the proximal descending thoracic aorta and thoracic aortography performed.  A 5 [place] Mickelson catheter was used to selectively catheterize a right bronchial artery trunk. Selective arteriography was performed. Micro catheter was then advanced into the bronchial trunk and additional selective arteriography performed. Transcatheter embolization was then performed with instillation of 1 vial of 300 - 500 micron sized Embosphere particles in diluted contrast material. Additional angiography was then performed through the micro catheter.  Attempt was made to selectively catheterize a second right bronchial artery trunk with use of different 5 French catheters and multiple guidewires. Oblique arteriography was performed at the level of the femoral access site. Arteriotomy closure was performed with the Cordis ExoSeal device and manual compression.  Complications: None  FINDINGS: Descending thoracic aortogram shows one dominant right bronchial artery trunk emanating from the upper descending thoracic aorta. After catheterizing this artery selectively, arteriography demonstrates bronchial supply to both upper and lower lung zones. The vessel was successfully embolized with very slow and static flow present in the artery after embolization.  Additional injection shows a  separate and smaller accessory bronchial trunk supplying both central right and left bronchial arteries. This artery was attempted to be catheterized to allow additional right-sided embolization. Due to small caliber of the artery as well as tortuosity at its origin, the vessel could not be successfully catheterized. No other definitive right bronchial artery supply was identified.  IMPRESSION: Arteriography demonstrates a dominant right bronchial artery trunk supplying both upper and lower lung zones. This trunk was successfully embolized with microsphere particle embolization. A smaller accessory right bronchial artery could not be selectively catheterized.   Electronically Signed   By: Aletta Edouard M.D.   On: 01/17/2014 13:39   Dg Chest Port 1 View  01/12/2014   CLINICAL DATA:  Shortness of breath and weakness  EXAM: PORTABLE CHEST - 1 VIEW  COMPARISON:  12/19/2013  FINDINGS: Cardiac shadow is stable. Postoperative changes are noted. Changes are noted in the right mid and lower lung similar  to that seen on the prior exam however there is a superimposed component of increased opacification. This may represent acute on chronic infiltrate. Correlation with the clinical exam is recommended. The left lung remains clear. No bony abnormality is seen.  IMPRESSION: Likely acute on chronic infiltrate.   Electronically Signed   By: Inez Catalina M.D.   On: 01/12/2014 14:07   [place] Guide Roadmapping  01/17/2014   CLINICAL DATA:  History of right lung carcinoma and status post radiation therapy and chemotherapy. The patient has been admitted with intractable hemoptysis.  EXAM: 1. ULTRASOUND GUIDANCE FOR VASCULAR ACCESS OF THE RIGHT COMMON FEMORAL ARTERY 2. THORACIC AORTOGRAPHY 3. SELECTIVE CATHETERIZATION AND ANGIOGRAPHY OF RIGHT BRONCHIAL ARTERY TRUNK 4. TRANSCATHETER EMBOLIZATION OF RIGHT BRONCHIAL ARTERY  MEDICATIONS: Sedation:  2.0 mg IV Versed and 75 mcg IV fentanyl.  Total  Moderate Sedation Time:  50 minutes.  CONTRAST:  61mL OMNIPAQUE IOHEXOL 350 MG/ML SOLN, 34mL OMNIPAQUE IOHEXOL 300 MG/ML SOLN  FLUOROSCOPY TIME:  40 min and 24 seconds.  PROCEDURE: Prior to the procedure, informed consent was obtained from the patient. Benefits and risks of the procedure were discussed in detail with the patient and his family. The patient was in agreement to proceed.  The right groin was prepped with Betadine and sterilely draped. Local anesthesia was provided by infiltration of 1% lidocaine.  Ultrasound was used to confirm patency of the right common femoral artery. Under direct ultrasound guidance, the artery was accessed with a micropuncture set. After advancing a guidewire in the artery, a 5 French vascular sheath was placed.  A 5 French pigtail catheter was advanced into the aorta. This is advanced to the level of the proximal descending thoracic aorta and thoracic aortography performed.  A 5 [place] Mickelson catheter was used to selectively catheterize a right bronchial artery trunk. Selective arteriography was performed. Micro catheter was then advanced into the bronchial trunk and additional selective arteriography performed. Transcatheter embolization was then performed with instillation of 1 vial of 300 - 500 micron sized Embosphere particles in diluted contrast material. Additional angiography was then performed through the micro catheter.  Attempt was made to selectively catheterize a second right bronchial artery trunk with use of different 5 French catheters and multiple guidewires. Oblique arteriography was performed at the level of the femoral access site. Arteriotomy closure was performed with the Cordis ExoSeal device and manual compression.  Complications: None  FINDINGS: Descending thoracic aortogram shows one dominant right bronchial artery trunk emanating from the upper descending thoracic aorta. After catheterizing this artery selectively, arteriography demonstrates bronchial  supply to both upper and lower lung zones. The vessel was successfully embolized with very slow and static flow present in the artery after embolization.  Additional injection shows a separate and smaller accessory bronchial trunk supplying both central right and left bronchial arteries. This artery was attempted to be catheterized to allow additional right-sided embolization. Due to small caliber of the artery as well as tortuosity at its origin, the vessel could not be successfully catheterized. No other definitive right bronchial artery supply was identified.  IMPRESSION: Arteriography demonstrates a dominant right bronchial artery trunk supplying both upper and lower lung zones. This trunk was successfully embolized with microsphere particle embolization. A smaller accessory right bronchial artery could not be selectively catheterized.   Electronically Signed   By: Aletta Edouard M.D.   On: 01/17/2014 13:39    Microbiology: Recent Results (from the past 240 hour(s))  CLOSTRIDIUM DIFFICILE BY PCR     Status: None   Collection Time    01/13/14  1:43 PM      Result Value Ref Range Status   C difficile by pcr NEGATIVE  NEGATIVE Final   Comment: Performed at [place]: Basic Metabolic Panel:  Recent Labs Lab 01/12/14 1345 01/13/14 0345 01/15/14 0420 01/16/14 0407  NA 135* 129* 132* 132*  K 4.4 3.5* 3.6* 3.4*  CL 94* 92* 97 96  CO2 24 23 22 22   GLUCOSE 223* 224* 194* 160*  BUN 10 8 6 6   CREATININE 0.89 0.78 0.69 0.66  CALCIUM 9.1 8.0* 8.2* 8.2*   CBC:  Recent Labs Lab 01/12/14 1345  01/14/14 0350 01/14/14 1420 01/14/14 1955 01/15/14 0420 01/16/14 0407  WBC 11.1*  --   --   --   --  12.7* 10.0  HGB 12.9*  < > 10.9* 11.4* 11.3* 10.6* 10.7*  HCT 38.3*  < > 32.1* 34.1* 32.3* 32.1* 31.0*  MCV 93.9  --   --   --   --  92.0 91.7  PLT 240  --   --   --   --  269 285  < > = values in this interval not displayed.  BNP: BNP (last 3 results)  Recent Labs   01/12/14 1345  PROBNP 204.4*   CBG:  Recent Labs Lab 01/16/14 1153 01/16/14 1753 01/16/14 2214 01/17/14 0745 01/17/14 1128  GLUCAP 125* 103* 151* 177* 204*   Signed:  [place] Oborn  [facility] 01/17/2014, 1:53 PM

## 2014-01-17 NOTE — Progress Notes (Signed)
Tolerating bronchial artery embolization on right yesterday.  Will be available for potential second embolization attempt if significant recurrent hemoptysis in future.

## 2014-01-19 ENCOUNTER — Telehealth: Payer: Self-pay | Admitting: Medical Oncology

## 2014-01-19 ENCOUNTER — Other Ambulatory Visit (HOSPITAL_COMMUNITY): Payer: Medicare HMO

## 2014-01-19 NOTE — Progress Notes (Signed)
Discharge summary sent to payer through [facility]  

## 2014-01-19 NOTE — Telephone Encounter (Signed)
Appointment confirmed for this week.

## 2014-01-21 ENCOUNTER — Telehealth: Payer: Self-pay | Admitting: Internal Medicine

## 2014-01-21 ENCOUNTER — Telehealth: Payer: Self-pay | Admitting: *Deleted

## 2014-01-21 NOTE — Telephone Encounter (Signed)
Tell them I have emailed Dr Kerin Ransom at [facility] who actually emailed me Monday asking about DAVIDLEE JEANBAPTISTE. I think we need a [facility] eval and I am working on it. Meanwhile, measure sputum in measuring jar. At any time is more than 1/2 cup or more than 200cc in a day should go to ER immediately  Send message back to me please. Hopefully we hear from [place] today   Dr. Brand Males, M.D., [facility].C.P Pulmonary and Critical Care Medicine Staff Physician [place] [facility] Pager: [phone], If no answer or between  15:00h - 7:00h: call [phone]  01/21/2014 10:34 AM

## 2014-01-21 NOTE — Telephone Encounter (Signed)
Called spoke with spouse. She reports pt began coughing up bright red blood last night.  Pt spouse called oncology and advised them: Anders Grant, RN at 01/21/2014 9:34 AM     Pt's wife called stating that starting last night and again this morning pt started to cough up bright red blood again. She stated that when he was coughing this morning they started keeping the blood in a pint jar and it has filled up about 1/2 inch high. No other issues reported. Per Dr Vista Mink, he would need to either contact Dr. Golden Pop office to see what he advises or go to the ED for further evaluation. Pt's wife verbalized understanding. SLJ    Spouse is not sure what to do. Please advise MR thanks

## 2014-01-21 NOTE — Telephone Encounter (Signed)
David [name], Please ask your staff to call my assistant Josephina Shih [phone] to send records. I can see him next week.  Thanks [name]   See above message from Dr Fuller Canada. Please do as above. But if bleeding worse while waiting go to [facility] ER   Dr. Brand Males, M.D., [facility].C.P Pulmonary and Critical Care Medicine Staff Physician [place] [facility] Pager: [phone], If no answer or between  15:00h - 7:00h: call [phone]  01/21/2014 11:51 AM

## 2014-01-21 NOTE — Telephone Encounter (Signed)
I called David [name] and LMTCB X1 to get fax # to send records. Records printed off and left in triage will await call

## 2014-01-21 NOTE — Telephone Encounter (Signed)
Pt's wife called stating that starting last night and again this morning pt started to cough up bright red blood again.  She stated that when he was coughing this morning they started keeping the blood in a pint jar and it has filled up about 1/2 inch high.  No other issues reported.  Per Dr Vista Mink, he would need to either contact Dr. Golden Pop office to see what he advises or go to the ED for further evaluation.  Pt's wife verbalized understanding. SLJ

## 2014-01-21 NOTE — Telephone Encounter (Signed)
Called and spoke with spouse. Aware of recs. Will forward to MR

## 2014-01-22 ENCOUNTER — Telehealth: Payer: Self-pay | Admitting: Internal Medicine

## 2014-01-22 ENCOUNTER — Encounter: Payer: Medicare HMO | Admitting: Nutrition

## 2014-01-22 ENCOUNTER — Ambulatory Visit: Payer: Medicare HMO | Admitting: Internal Medicine

## 2014-01-22 ENCOUNTER — Ambulatory Visit: Payer: Medicare HMO

## 2014-01-22 ENCOUNTER — Other Ambulatory Visit: Payer: Medicare HMO

## 2014-01-22 ENCOUNTER — Telehealth: Payer: Self-pay | Admitting: *Deleted

## 2014-01-22 NOTE — Telephone Encounter (Signed)
Pt's spouse aware we are working on getting appt  Just need the number to fax records She verbalized understanding  I called Josephina Shih again and had to [facility] Records in triage

## 2014-01-22 NOTE — Telephone Encounter (Signed)
lmtcb x1 see phone note 01/21/14

## 2014-01-22 NOTE — Telephone Encounter (Signed)
Spoke with David [name]'s wife, David [name] will be going to [facility] to see Dr Kerin Ransom regarding his increased hemoptysis.  The appt is 01/27/14 at 4pm.  David [name]'s wife will call with an update after they do to that appt.  SLJ

## 2014-01-22 NOTE — Telephone Encounter (Signed)
Called and spoke with pt spouse. She already has pt appt they are awaiting records. Advised we have been trying to get fax # to fax these. Still waiting call back.  I called simon again and still NA. LMTCB x1

## 2014-01-22 NOTE — Telephone Encounter (Signed)
Wife returning call

## 2014-01-22 NOTE — Telephone Encounter (Signed)
Pt's wife is requesting to speak w/ the nurse regarding the same.  David [name]

## 2014-01-23 ENCOUNTER — Ambulatory Visit: Payer: Medicare HMO

## 2014-01-23 ENCOUNTER — Ambulatory Visit (HOSPITAL_COMMUNITY): Payer: Medicare HMO

## 2014-01-23 NOTE — Telephone Encounter (Signed)
I called and spoke with Cecilie Lowers. His fax # is [phone].  Per Anderson Malta - these records have already been faxed.  Will sign off on message.

## 2014-01-23 NOTE — Telephone Encounter (Signed)
I spoke with Cecilie Lowers and his fax # is [phone]. Records faxed. Arnold Bing, CMA

## 2014-01-27 ENCOUNTER — Telehealth: Payer: Self-pay | Admitting: Medical Oncology

## 2014-01-27 NOTE — Telephone Encounter (Signed)
Returned wife's call. I left a message to call me back.

## 2014-01-28 ENCOUNTER — Ambulatory Visit: Payer: Medicare HMO | Admitting: Radiation Oncology

## 2014-01-28 ENCOUNTER — Other Ambulatory Visit: Payer: Self-pay | Admitting: *Deleted

## 2014-01-28 ENCOUNTER — Ambulatory Visit: Payer: Medicare HMO

## 2014-01-28 NOTE — Telephone Encounter (Signed)
Called and spoke to Pt's wife.  She states that pt passed away on 03-06-23 02/17/14.  Informed Dr Vista Mink.  SLJ

## 2014-01-29 ENCOUNTER — Other Ambulatory Visit (HOSPITAL_COMMUNITY): Payer: Medicare HMO

## 2014-02-01 DEATH — deceased

## 2014-02-05 ENCOUNTER — Other Ambulatory Visit (HOSPITAL_COMMUNITY): Payer: Medicare HMO

## 2014-02-12 ENCOUNTER — Ambulatory Visit: Payer: Medicare HMO

## 2014-02-12 ENCOUNTER — Other Ambulatory Visit: Payer: Medicare HMO

## 2014-02-13 ENCOUNTER — Ambulatory Visit (HOSPITAL_COMMUNITY): Payer: Medicare HMO

## 2014-03-16 ENCOUNTER — Ambulatory Visit: Payer: Medicare HMO | Admitting: Radiation Oncology

## 2014-03-23 ENCOUNTER — Ambulatory Visit (HOSPITAL_COMMUNITY): Payer: Medicare HMO

## 2014-03-23 ENCOUNTER — Other Ambulatory Visit: Payer: Medicare HMO

## 2014-03-30 ENCOUNTER — Ambulatory Visit: Payer: Medicare HMO | Admitting: Internal Medicine

## 2014-07-28 ENCOUNTER — Other Ambulatory Visit: Payer: Self-pay | Admitting: *Deleted

## 2014-10-22 IMAGING — CT CT CHEST W/ CM
2 of 3 series · 15 of 36 positions shown, 18 images · IV contrast (OMNIPAQUE)
Comparison: NM PET IMAGE INITIAL (PI) SKULL BASE TO THIGH dated
09/02/2013; CT CHEST W/CM dated 08/19/2013

CLINICAL DATA: Lung cancer, chemotherapy complete.  Cough.

EXAM:
CT CHEST WITH CONTRAST
TECHNIQUE: Multidetector CT imaging of the chest was performed during
intravenous contrast administration.
CONTRAST:  80mL OMNIPAQUE IOHEXOL 300 MG/ML  SOLN

[Series 2: chest with st · axial · 0.82mm/px · z∈[-295,-15]mm · 12 of 66 slices shown, 15 images]
[im 5/66  mediastinal]
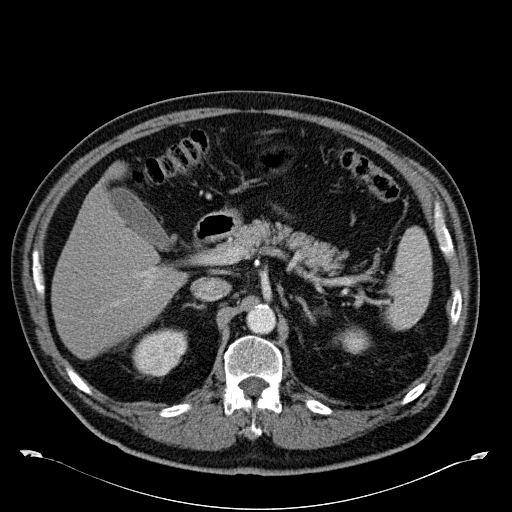
[im 5/66  lung]
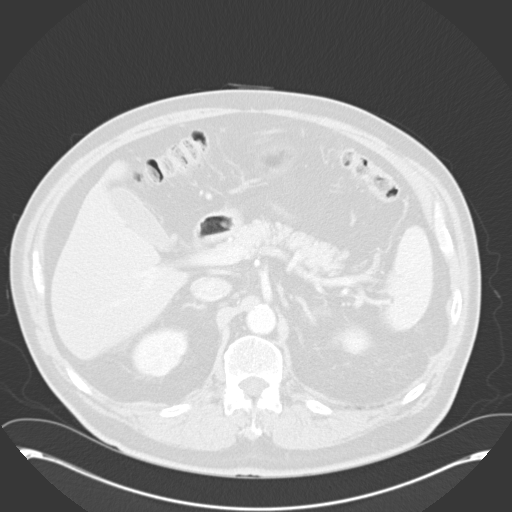
[im 10/66  lung]
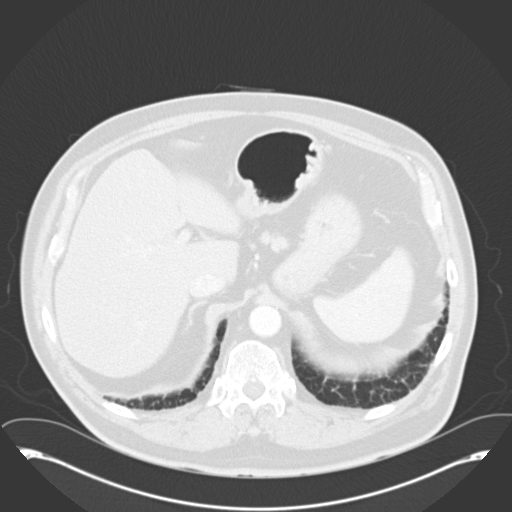
[im 15/66  lung]
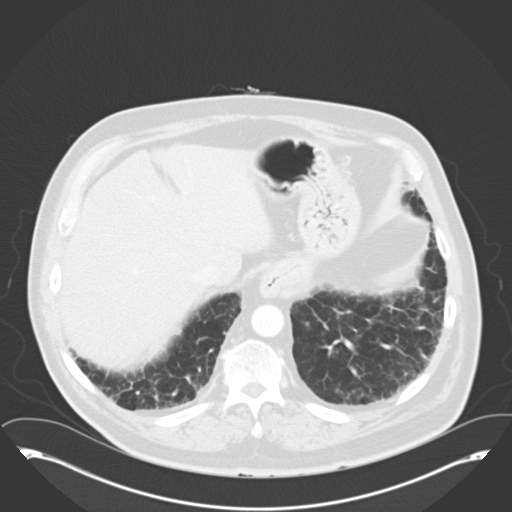
[im 20/66  lung]
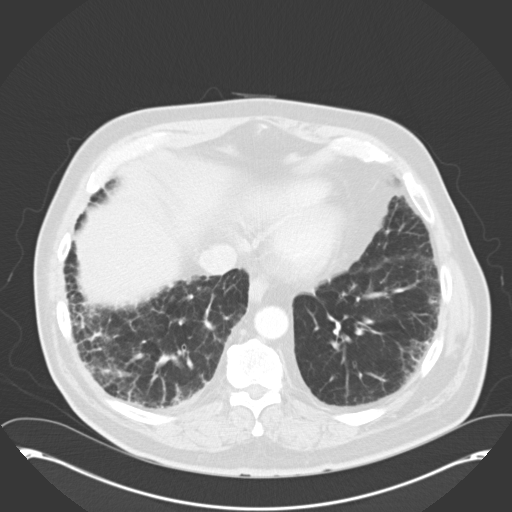
[im 25/66  mediastinal]
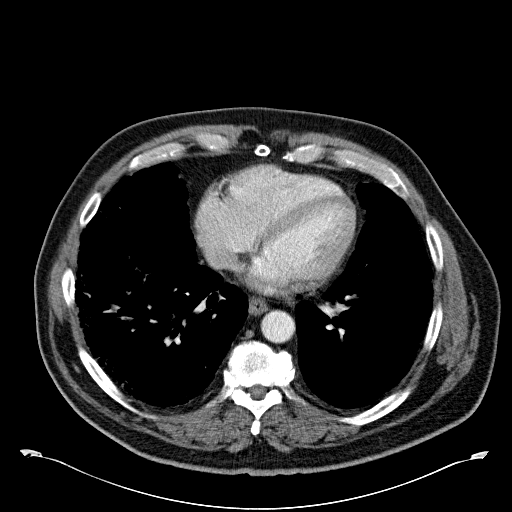
[im 25/66  lung]
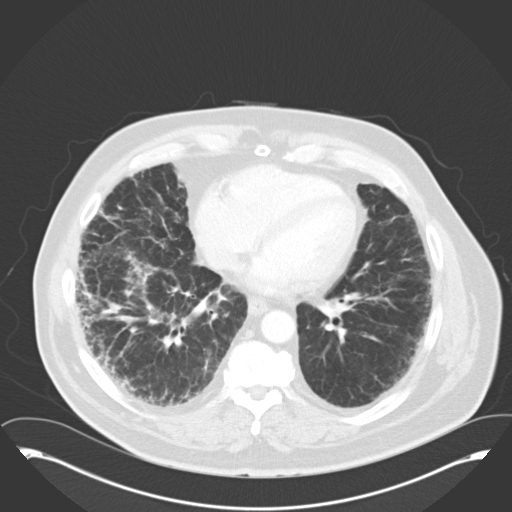
[im 29/66  lung]
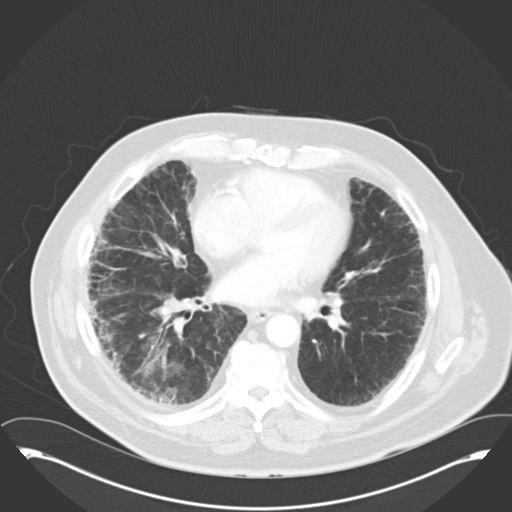
[im 37/66  lung]
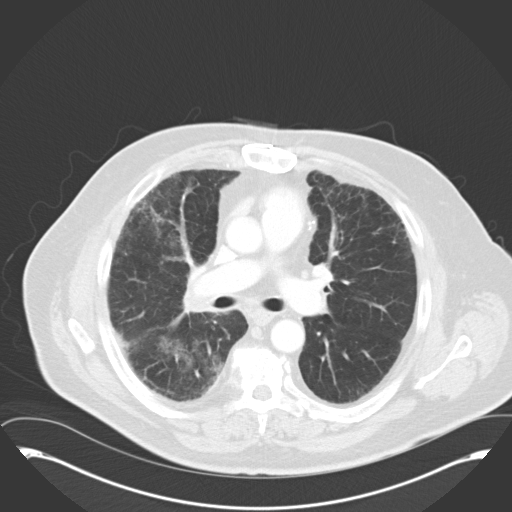
[im 41/66  lung]
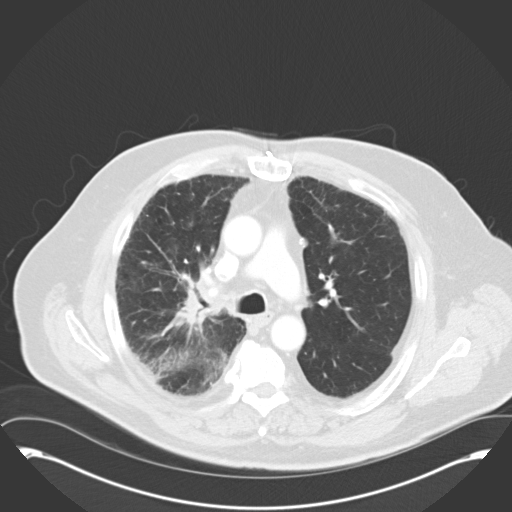
[im 46/66  mediastinal]
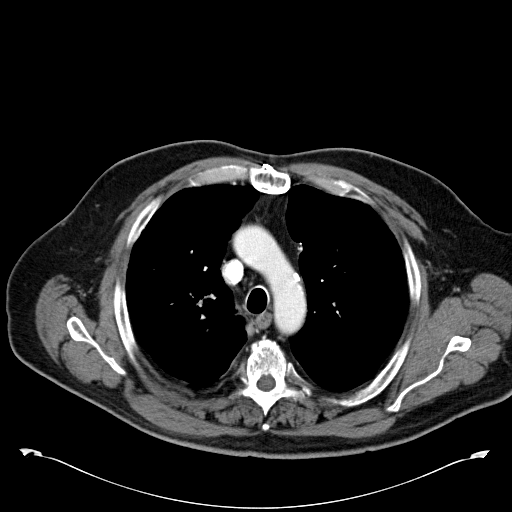
[im 46/66  lung]
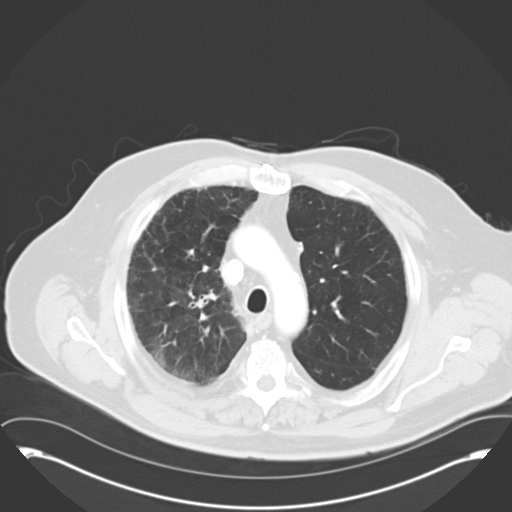
[im 51/66  lung]
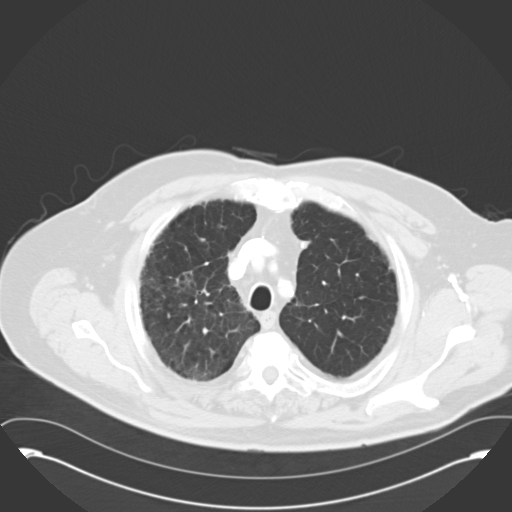
[im 56/66  lung]
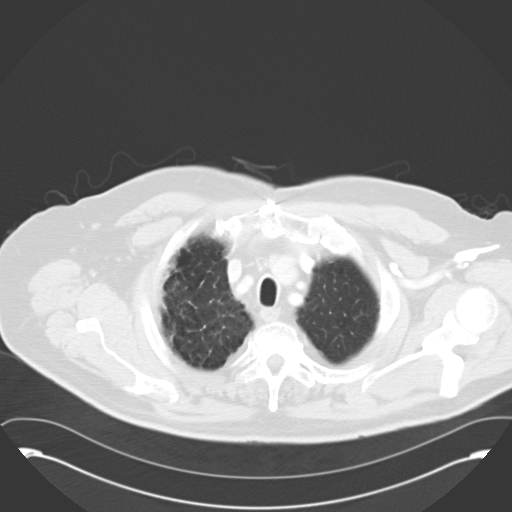
[im 61/66  lung]
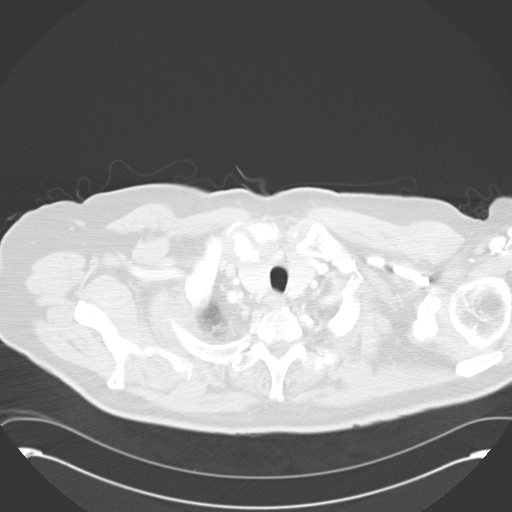

[Series 602: <mpr thick range> · coronal · 0.82mm/px · 3 of 147 slices shown]
[im 30/147  lung]
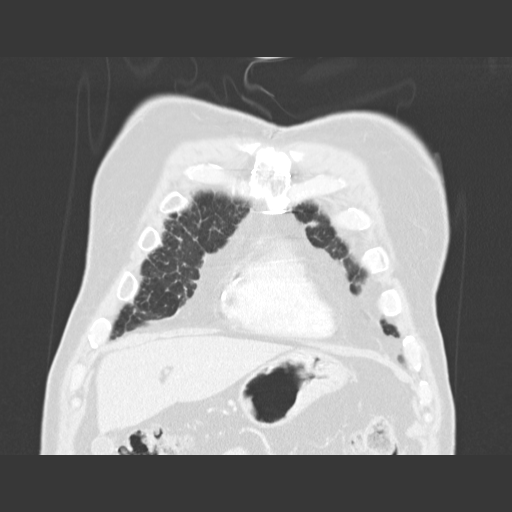
[im 59/147  lung]
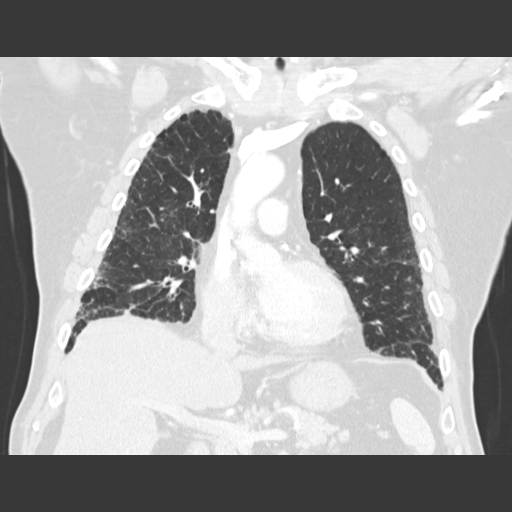
[im 88/147  lung]
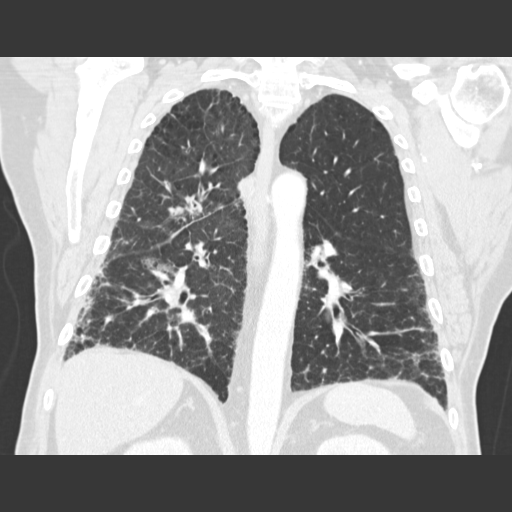

[15 of 36 positions shown; findings below may reference images not displayed]

FINDINGS: Mediastinal lymph nodes measure up to 8 mm in short axis in the
lower right paratracheal station (previously 10 mm). Right hilar
mass measures 2.0 x 3.1 cm (previously 3.4 x 4.8 cm). Soft tissue
thickening extends along the right upper lobe bronchi. No left hilar
or axillary adenopathy. Atherosclerotic calcification of the
arterial vasculature. Heart size within normal limits. No
pericardial effusion. There may be a tiny hiatal hernia.

Combination of mild centrilobular and paraseptal emphysema. A
peripheral and basilar predominant pattern of subpleural
reticulation, traction bronchiolectasis and mild architectural
distortion appears progressive from 08/19/2013. A focal area of
ground-glass is seen in the posterior segment right upper lobe with
some involvement of the superior segment right lower lobe. No
pleural fluid. There is narrowing of the posterior segmental
bronchus to the right upper lobe. Airway is otherwise unremarkable.

Incidental imaging of the upper abdomen shows no acute findings. No
worrisome lytic or sclerotic lesions. Degenerative changes are seen
in the spine.
IMPRESSION: 1. Interval decrease in size of a right hilar mass and mediastinal
lymph nodes with evolving changes of radiation therapy in the right
upper and right lower lobes.
2. Pulmonary parenchymal pattern of fibrosis appears progressive
from 08/19/2013. Progression and pattern are indicative of usual
interstitial pneumonitis (UIP).
